# Patient Record
Sex: Female | Born: 1960 | Race: White | Hispanic: No | Smoking: Current some day smoker
Health system: Southern US, Community
[De-identification: ages and names within clinical notes are randomized; demographics above are authoritative.]

## PROBLEM LIST (undated history)

## (undated) DIAGNOSIS — Z9889 Other specified postprocedural states: Secondary | ICD-10-CM

## (undated) DIAGNOSIS — I2510 Atherosclerotic heart disease of native coronary artery without angina pectoris: Secondary | ICD-10-CM

## (undated) DIAGNOSIS — M1990 Unspecified osteoarthritis, unspecified site: Secondary | ICD-10-CM

## (undated) DIAGNOSIS — N189 Chronic kidney disease, unspecified: Secondary | ICD-10-CM

## (undated) DIAGNOSIS — I219 Acute myocardial infarction, unspecified: Secondary | ICD-10-CM

## (undated) DIAGNOSIS — Z720 Tobacco use: Secondary | ICD-10-CM

## (undated) DIAGNOSIS — I10 Essential (primary) hypertension: Secondary | ICD-10-CM

## (undated) DIAGNOSIS — R112 Nausea with vomiting, unspecified: Secondary | ICD-10-CM

## (undated) DIAGNOSIS — F329 Major depressive disorder, single episode, unspecified: Secondary | ICD-10-CM

## (undated) DIAGNOSIS — E119 Type 2 diabetes mellitus without complications: Secondary | ICD-10-CM

## (undated) DIAGNOSIS — N281 Cyst of kidney, acquired: Secondary | ICD-10-CM

## (undated) DIAGNOSIS — E8881 Metabolic syndrome: Secondary | ICD-10-CM

## (undated) HISTORY — DX: Tobacco use: Z72.0

## (undated) HISTORY — DX: Essential (primary) hypertension: I10

## (undated) HISTORY — PX: CHOLECYSTECTOMY: SHX55

## (undated) HISTORY — DX: Atherosclerotic heart disease of native coronary artery without angina pectoris: I25.10

## (undated) HISTORY — DX: Type 2 diabetes mellitus without complications: E11.9

## (undated) HISTORY — DX: Metabolic syndrome: E88.81

## (undated) HISTORY — PX: TOE AMPUTATION: SHX809

## (undated) HISTORY — DX: Cyst of kidney, acquired: N28.1

## (undated) HISTORY — PX: BREAST SURGERY: SHX581

## (undated) HISTORY — DX: Metabolic syndrome: E88.810

## (undated) HISTORY — DX: Major depressive disorder, single episode, unspecified: F32.9

---

## 2000-06-08 ENCOUNTER — Other Ambulatory Visit: Admission: RE | Admit: 2000-06-08 | Discharge: 2000-06-08 | Payer: Self-pay | Admitting: Family Medicine

## 2000-06-23 ENCOUNTER — Ambulatory Visit (HOSPITAL_COMMUNITY): Admission: RE | Admit: 2000-06-23 | Discharge: 2000-06-23 | Payer: Self-pay | Admitting: Family Medicine

## 2000-06-23 ENCOUNTER — Encounter: Payer: Self-pay | Admitting: Family Medicine

## 2001-10-11 ENCOUNTER — Inpatient Hospital Stay (HOSPITAL_COMMUNITY): Admission: EM | Admit: 2001-10-11 | Discharge: 2001-10-14 | Payer: Self-pay | Admitting: *Deleted

## 2001-10-11 ENCOUNTER — Encounter: Payer: Self-pay | Admitting: *Deleted

## 2001-10-12 ENCOUNTER — Encounter: Payer: Self-pay | Admitting: Internal Medicine

## 2001-10-13 ENCOUNTER — Encounter: Payer: Self-pay | Admitting: Internal Medicine

## 2003-05-22 ENCOUNTER — Ambulatory Visit (HOSPITAL_COMMUNITY): Admission: RE | Admit: 2003-05-22 | Discharge: 2003-05-22 | Payer: Self-pay | Admitting: Family Medicine

## 2003-06-04 ENCOUNTER — Ambulatory Visit (HOSPITAL_COMMUNITY): Admission: RE | Admit: 2003-06-04 | Discharge: 2003-06-04 | Payer: Self-pay | Admitting: Family Medicine

## 2003-07-23 ENCOUNTER — Ambulatory Visit (HOSPITAL_COMMUNITY): Admission: RE | Admit: 2003-07-23 | Discharge: 2003-07-23 | Payer: Self-pay | Admitting: Nephrology

## 2004-06-17 ENCOUNTER — Ambulatory Visit (HOSPITAL_COMMUNITY): Admission: RE | Admit: 2004-06-17 | Discharge: 2004-06-17 | Payer: Self-pay | Admitting: Family Medicine

## 2005-07-01 ENCOUNTER — Ambulatory Visit (HOSPITAL_COMMUNITY): Admission: RE | Admit: 2005-07-01 | Discharge: 2005-07-01 | Payer: Self-pay | Admitting: Family Medicine

## 2005-07-20 ENCOUNTER — Ambulatory Visit (HOSPITAL_COMMUNITY): Admission: RE | Admit: 2005-07-20 | Discharge: 2005-07-20 | Payer: Self-pay | Admitting: Family Medicine

## 2006-07-28 ENCOUNTER — Ambulatory Visit (HOSPITAL_COMMUNITY): Admission: RE | Admit: 2006-07-28 | Discharge: 2006-07-28 | Payer: Self-pay | Admitting: Family Medicine

## 2007-08-01 ENCOUNTER — Emergency Department (HOSPITAL_COMMUNITY): Admission: EM | Admit: 2007-08-01 | Discharge: 2007-08-01 | Payer: Self-pay | Admitting: Emergency Medicine

## 2007-08-15 ENCOUNTER — Emergency Department (HOSPITAL_COMMUNITY): Admission: EM | Admit: 2007-08-15 | Discharge: 2007-08-15 | Payer: Self-pay | Admitting: Emergency Medicine

## 2007-08-17 ENCOUNTER — Ambulatory Visit (HOSPITAL_COMMUNITY): Admission: RE | Admit: 2007-08-17 | Discharge: 2007-08-17 | Payer: Self-pay | Admitting: Family Medicine

## 2008-03-28 ENCOUNTER — Ambulatory Visit (HOSPITAL_COMMUNITY): Admission: RE | Admit: 2008-03-28 | Discharge: 2008-03-28 | Payer: Self-pay | Admitting: Family Medicine

## 2009-02-16 HISTORY — PX: BILATERAL OOPHORECTOMY: SHX1221

## 2009-06-14 ENCOUNTER — Emergency Department (HOSPITAL_COMMUNITY): Admission: EM | Admit: 2009-06-14 | Discharge: 2009-06-14 | Payer: Self-pay | Admitting: Emergency Medicine

## 2009-11-22 ENCOUNTER — Ambulatory Visit (HOSPITAL_COMMUNITY): Admission: RE | Admit: 2009-11-22 | Discharge: 2009-11-22 | Payer: Self-pay | Admitting: Family Medicine

## 2010-02-16 DIAGNOSIS — F32A Depression, unspecified: Secondary | ICD-10-CM

## 2010-02-16 HISTORY — DX: Depression, unspecified: F32.A

## 2010-05-06 LAB — URINE CULTURE: Colony Count: >=100000

## 2010-05-06 LAB — URINALYSIS, ROUTINE W REFLEX MICROSCOPIC
Bilirubin Urine: NEGATIVE
Glucose, UA: NEGATIVE mg/dL
Nitrite: POSITIVE — AB
Protein, ur: 100 mg/dL — AB
pH: 6 (ref 5.0–8.0)

## 2010-05-06 LAB — URINE MICROSCOPIC-ADD ON

## 2010-06-27 ENCOUNTER — Emergency Department (HOSPITAL_COMMUNITY)
Admission: EM | Admit: 2010-06-27 | Discharge: 2010-06-27 | Disposition: A | Discharge status: Home / Self Care | Payer: 59 | Attending: Emergency Medicine | Admitting: Emergency Medicine

## 2010-06-27 ENCOUNTER — Emergency Department (HOSPITAL_COMMUNITY): Payer: 59

## 2010-06-27 DIAGNOSIS — N838 Other noninflammatory disorders of ovary, fallopian tube and broad ligament: Secondary | ICD-10-CM | POA: Insufficient documentation

## 2010-06-27 DIAGNOSIS — R109 Unspecified abdominal pain: Secondary | ICD-10-CM | POA: Insufficient documentation

## 2010-06-27 DIAGNOSIS — N83209 Unspecified ovarian cyst, unspecified side: Secondary | ICD-10-CM | POA: Insufficient documentation

## 2010-06-27 LAB — URINALYSIS, ROUTINE W REFLEX MICROSCOPIC
Bilirubin Urine: NEGATIVE
Leukocytes, UA: NEGATIVE
Nitrite: NEGATIVE
Protein, ur: >300 mg/dL — AB
Specific Gravity, Urine: >1.03 — ABNORMAL HIGH (ref 1.005–1.030)

## 2010-06-27 LAB — COMPREHENSIVE METABOLIC PANEL
Albumin: 3.5 g/dL (ref 3.5–5.2)
BUN: 9 mg/dL (ref 6–23)
CO2: 26 mEq/L (ref 19–32)
Chloride: 101 mEq/L (ref 96–112)
GFR calc non Af Amer: >60 mL/min (ref >60)
Glucose, Bld: 143 mg/dL — ABNORMAL HIGH (ref 70–99)
Potassium: 3.7 mEq/L (ref 3.5–5.1)
Sodium: 135 mEq/L (ref 135–145)
Total Bilirubin: 0.5 mg/dL (ref 0.3–1.2)

## 2010-06-27 LAB — DIFFERENTIAL
Basophils Absolute: 0 10*3/uL (ref 0.0–0.1)
Basophils Relative: 0 % (ref 0–1)
Eosinophils Relative: 1 % (ref 0–5)
Monocytes Absolute: 0.4 10*3/uL (ref 0.1–1.0)

## 2010-06-27 LAB — LIPASE, BLOOD: Lipase: 25 U/L (ref 11–59)

## 2010-06-27 LAB — URINE MICROSCOPIC-ADD ON

## 2010-06-27 LAB — CBC: RDW: 13.3 % (ref 11.5–15.5)

## 2010-06-27 MED ORDER — IOHEXOL 300 MG/ML  SOLN
100.0000 mL | Freq: Once | INTRAMUSCULAR | Status: AC | PRN
  Administered 2010-06-27: 100 mL via INTRAVENOUS

## 2010-07-04 NOTE — Consult Note (Signed)
NAME:  Christy [name], Christy [name]                          ACCOUNT NO.:  [id number]   MEDICAL RECORD NO.:  [id number]                   PATIENT TYPE:  INP   LOCATION:  A325                                 FACILITY:  [facility]   PHYSICIAN:  Dalia Heading, M.D.               DATE OF BIRTH:  [birth year]   DATE OF CONSULTATION:  10/12/2001  DATE OF DISCHARGE:                           GENERAL SURGERY CONSULTATION   CHIEF COMPLAINT:  Cholelithiasis, gallstone pancreatitis.   HISTORY OF PRESENT ILLNESS:  The patient is a 50 year old white female who  presented yesterday with worsening epigastric and right upper quadrant  abdominal pain and nausea.  She was noted in the emergency room to have  elevated liver enzyme tests as well as pancreatitis.  An ultrasound of the  gallbladder was performed which revealed cholelithiasis with a common bile  duct that measured only 3.5 mm.  No choledocholithiasis was seen.  Her  followup liver enzyme tests have been decreasing back to normal.  Her  bilirubin is within normal limits.  Her amylase and lipase have been  decreasing.  SGOT and SGPT have decreased.   Dr. Lionel December of gastroenterology was consulted and felt that an ERCP  was not warranted at this time.   PAST MEDICAL HISTORY:  Past medical history includes hypertension,  depression.   PAST SURGICAL HISTORY:  Breast biopsy in the past.   CURRENT MEDICATIONS:  Maxzide, Effexor.   ALLERGIES:  No known drug allergies.   REVIEW OF SYSTEMS:  The patient does smoke a pack of cigarettes a day.  She  denies any recent chest pain, shortness of breath, CVA, diabetes mellitus,  or bleeding disorders.   PHYSICAL EXAMINATION:  GENERAL:  On physical examination, the patient is a  well-developed, well-nourished white female in no acute distress.  VITAL SIGNS:  She is afebrile and vital signs are stable.  ABDOMEN:  Abdomen is soft with mild tenderness in the right upper quadrant  to palpation.  No  hepatosplenomegaly, masses, or herniae are identified.   LABORATORY AND ACCESSORY CLINICAL DATA:  White blood cell count 10.4,  hematocrit 35, platelet count 236,000.  MET-7 is within normal limits.   Liver tests and amylase and lipase are in the chart.   IMPRESSION:  Gallstone pancreatitis.    PLAN:  The patient is scheduled for laparoscopic cholecystectomy with  intraoperative cholangiograms tomorrow.  The risks and benefits of the  procedure including bleeding, infection, hepatobiliary injury, and the  possibility of an open procedure were fully explained to the patient, who  gave informed consent.                                               Dalia Heading, M.D.    MAJ/MEDQ  D:  10/12/2001  T:  10/13/2001  Job:  [id number]   cc:   Madelin Rear. Sherwood Gambler, M.D.  [address]  [employer]  [place] [address]  Fax: [phone]   Lionel December, M.D.

## 2010-07-04 NOTE — Discharge Summary (Signed)
   NAME:  Christy [name], Christy [name]                          ACCOUNT NO.:  [id number]   MEDICAL RECORD NO.:  [id number]                   PATIENT TYPE:  INP   LOCATION:  A325                                 FACILITY:  [facility]   PHYSICIAN:  Dalia Heading, M.D.               DATE OF BIRTH:  18-Apr-1960   DATE OF ADMISSION:  10/11/2001  DATE OF DISCHARGE:  10/14/2001                                 DISCHARGE SUMMARY   HOSPITAL COURSE SUMMARY:  The patient is a 50 year old white female with  history of hypertension who presented with upper abdominal pain, nausea,  vomiting.  She was diagnosed with gallstone pancreatitis.  Ultrasound  revealed a gallbladder full of stones, but the common bile duct was within  normal limits.  Her liver enzyme test, as well as amylase and lipase, were  returning to normal on hospital day #2.  A GI consultation was obtained and  was felt the patient did not need an ERCP prior to surgery.  A surgery  consultation was obtained and the patient was taken to the operating room on  October 13, 2001 and underwent a laparoscopic cholecystectomy with  cholangiograms.  The cholangiograms did not reveal choledocholithiasis.  The  patient tolerated the procedure well.   The patient, on postoperative day #1, is doing well.  Her hematocrit is  stable, as well as her white blood cell count.  Liver enzyme test, as well  as amylase and lipase are pending.  The patient is being discharged home  pending those results.   FOLLOW UP:  The patient is to follow up with Dr. Franky Macho on October 20, 2001.   DISCHARGE MEDICATIONS:  Vicodin 1-2 tablets p.o. q.4h. p.r.n. pain,  dispensed 50, no refills.   PRINCIPAL DIAGNOSES:  1. Gallstone pancreatitis.  2. Cholecystitis, cholelithiasis.  3.     Hypertension.  4. Depression.   PRINCIPAL PROCEDURE:  Laparoscopic cholecystectomy with cholangiograms on  October 13, 2001.                                                Dalia Heading,  M.D.    MAJ/MEDQ  D:  10/14/2001  T:  10/14/2001  Job:  [id number]   cc:   Madelin Rear. Sherwood Gambler, M.D.  [address]  [place]  [place] [address]  Fax: [phone]   Lionel December, M.D.

## 2010-07-04 NOTE — H&P (Signed)
NAME:  Christy [name], Christy [name]                          ACCOUNT NO.:  [id number]   MEDICAL RECORD NO.:  [id number]                   PATIENT TYPE:  INP   LOCATION:  A325                                 FACILITY:  [facility]   PHYSICIAN:  Isidor Holts, M.D.               DATE OF BIRTH:  Jan 09, 1961   DATE OF ADMISSION:  10/11/2001  DATE OF DISCHARGE:                                HISTORY & PHYSICAL   CHIEF COMPLAINT:  Abdominal pain for two days, vomited once.   HISTORY OF PRESENT ILLNESS:  This is a 50 year old Caucasian female with  history essentially as above.  She was quite well, until lunchtime on October 10, 2001.  She ate a lunch of barbeque and buttered potatoes.  An hour  later, developed upper abdominal pains radiating to her back.  They were  constant, associated with watery motions x3.  No blood or mucus in the  stools.  This has now stopped.  The abdominal pain, however, continued all  night, gradually increasing in severity.  She went to work in the morning of  October 11, 2001 but had to return home at about 2:00 p.m. due to pain.  Her  spouse eventually brought her to the emergency room at [facility].  While in the emergency room, she vomited once.  Vomitus consisted of food  remnants.  There was no hematemesis.   PAST MEDICAL HISTORY:  1. Premenstrual dysphoric disorder.  2. Dysmenorrhea.  3. Hypertension.  4. Status post right breast biopsy two years ago, benign findings.   ALLERGIES:  No known drug allergies.   MEDICATIONS:  1. Effexor XR 150 mg p.o. q.d.  2. Fluoxetine 30 mg p.o. q.d.  3. Maxzide 37.5/25 one p.o. q.d.  4. Hydrocodone p.r.n.   SOCIAL HISTORY:  Married with one offspring who is alive and well.  Smoker,  two packs per day.  Denies alcohol use.  Denies drug abuse.   PHYSICAL EXAMINATION:  VITAL SIGNS:  Temperature 97.2 degrees, pulse 72,  respirations 20, blood pressure 116/68.  GENERAL:  She appeared comfortable after Demerol/Phenergan in  the emergency  room, was communicative and cooperative.  HEENT:  No clinical pallor, no jaundice, no conjunctival injection.  NECK:  Supple.  No palpable lymphadenopathy.  No palpable goiter.  JVD was  not seen.  CHEST:  Occasional expiratory rhonchi.  No crackles heard.  HEART:  Heart sounds were normal, 1 and 2 were heard, regular, no murmurs.  ABDOMEN:  Obese, soft,with right upper quadrant and epigastric tenderness.  No guarding or rebound.  No palpable organomegaly.  Normal bowel sounds.  EXTREMITIES:  Lower extremity examination was entirely unremarkable.   INVESTIGATIONS:  Labs:  Chemistry:  Sodium 135, potassium 4.5, chloride 99,  CO2 29, BUN 10, creatinine 1.1, glucose 155.  LFT's:  AST 248, ALT 171,  alkaline phosphatase 116, total bilirubin 2.5, direct bilirubin 1.4, amylase  984,  lipase 2722.  CBC:  WBC 10.6, hemoglobin 14.0, hematocrit 39.7,  platelets 292.  Urinalysis:  Bilirubin moderate, hemoglobin moderate, total  protein over 300, nitrite positive, leukocyte esterase negative.  __________  test positive.   ASSESSMENT AND PLAN:  Admit general medical floor.   1. Acute pancreatitis, likely secondary to gallstones in view of impaired     LFT's; however, thyroid treatment may be contributory.  We will keep     n.p.o., discontinue thiazides.  Commence intravenous fluids, proton pump     inhibitor, pain medications, and anti-emetics.  We have arranged a     hepatobiliary/pancreatic ultrasound to assess the pancreas and rule out     gallstones.  Gastroenterology consult will be required, provided by Dr.     Karilyn Cota, who will also check a fasting lipid profile.  2. Hypertension, controlled.  3. Premenstrual dysphoric disorder, stable.   Further management will depend on clinical course.                                               Isidor Holts, M.D.    CO/MEDQ  D:  10/11/2001  T:  10/12/2001  Job:  [id number]   cc:   Kirk Ruths, M.D.  [address]   [place]  [place] [address]  Fax: [phone]

## 2010-07-04 NOTE — Consult Note (Signed)
NAME:  Christy [name], Christy [name]                          ACCOUNT NO.:  [id number]   MEDICAL RECORD NO.:  [id number]                   PATIENT TYPE:  INP   LOCATION:  A325                                 FACILITY:  [facility]   PHYSICIAN:  Lionel December, M.D.                 DATE OF BIRTH:  [birth year]   DATE OF CONSULTATION:  10/12/2001  DATE OF DISCHARGE:                                   CONSULTATION   PHYSICIAN REQUESTING CONSULTATION:  Dr. Isidor Holts for Dr. Madelin Rear.  [place].   REASON FOR CONSULTATION:  Pancreatitis, rule out cholelithiasis.   HISTORY OF PRESENT ILLNESS:  The patient is a pleasant 50 year old Caucasian  female with history of premenstrual disorder, hypertension, who was admitted  yesterday with a two-day history of epigastric abdominal pain, nausea and  vomiting.  She developed abdominal pain two days ago shortly after eating  dinner.  She had spaghetti and within one hour, developed upper epigastric  pain, feeling of excess gas and nausea.  She had three episodes of watery  diarrhea.  By the next morning, she felt some better, went to work, however,  after eating lunch, symptoms recurred and were quite severe.  She describes  the pain as constant in nature.  Denies any radiation into the back.  In the  emergency department, she did have an episode of emesis.  Denies  hematemesis, melena, bright red blood per rectum.  Usually, she has no  problems with constipation or diarrhea.  She had heartburn a couple of days  a week and usually takes Mylanta, which gives relief.  She reports having a  similar episode one month ago and was diagnosed with acute gastroenteritis.  She has also had episodes similar to this approximately four years ago but  never required hospitalization, never given diagnosis of pancreatitis or  gallstones previously.   In the emergency department, she was found to have an elevated white count  of 10.6, hemoglobin 14, hematocrit 39.7; potassium 4.5,  BUN 10, creatinine  1.1, glucose 155, total bilirubin 2.5, direct bilirubin 1.4, alkaline  phosphatase 116, SGOT 248, SGPT 171, albumin 3.5, amylase 94, lipase 2722.  Urinalysis also revealed moderate bilirubin, trace ketones, moderate blood,  protein greater than 300, positive nitrites, a few squamous cells, granular  casts, 7-10 wbc's, 11-20 rbc's and a few bacteria.  She is currently on IV  Protonix 40 mg q.24h., given IV Demerol for pain control.  She tells me that  she is feeling better this morning.  The pain is less intense, no further  vomiting.  She is unsure if she takes any NSAIDs.  She is on PMS pills and  Equate sinus pills.   MEDICATIONS AT HOME:  1. Effexor XR 150 mg q.d.  2. Fluoxetine 20 mg q.d.  3. Maxzide 37.5/25 mg q.d.  4. Hydrocodone p.r.n.  5. Phenergan suppositories p.r.n.  6. PMS pills p.r.n.  7. Equate sinus pills p.r.n.   ALLERGIES:  No known drug allergies.   PAST MEDICAL HISTORY:  Hypertension, dysmenorrhea, premenstrual dysphoric  disorder.   PAST SURGICAL HISTORY:  Breast biopsy for benign lesion two years ago.   FAMILY HISTORY:  Mother has a history of gallstones, status post  cholecystectomy.  Maternal grandfather had lung cancer and possibly primary  brain tumor as well.   SOCIAL HISTORY:  She is married and has one child.  She smokes 2 packs of  cigarettes daily and has smoked since age 70.  No alcohol use or drug use.   REVIEW OF SYSTEMS:  Please see HPI for GI.  GU:  Denies any dysuria or  hematuria.  Complains of severe menstrual cramps during menstruation.   PHYSICAL EXAMINATION:  VITAL SIGNS:  Height 5 feet 7 inches.  Weight 235.7.  T-max and T current 98.7, pulse 68, respirations 20, blood pressure 112/65.  GENERAL:  A very pleasant, well-nourished, well-developed, moderately obese  Caucasian female in no acute distress.  SKIN:  Warm and dry.  No obvious jaundice.  HEENT:  Sclerae slightly icteric.  Conjunctivae are pink.   Oropharyngeal  mucosa moist and pink.  No lesions, erythema or exudate.  NECK:  No lymphadenopathy or thyromegaly.  CHEST:  Lungs clear to auscultation.  CARDIAC:  Exam reveals regular rate and rhythm, normal S1 and S2; no  murmurs, rubs, or gallops.  ABDOMEN:  Positive bowel sounds.  Obese but symmetrical.  Moderate  tenderness in the epigastric region.  No hepatosplenomegaly or masses.  No  rebound tenderness or guarding.  EXTREMITIES:  No edema.   LABORATORY DATA:  Laboratories as mentioned in HPI.  In addition, white  count today is 10.4, hemoglobin 12.9, hematocrit 37.2, platelets 236,000.   IMPRESSION:  The patient is a pleasant 50 year old Caucasian female with  probable biliary pancreatitis.  She has elevated amylase and lipase as well  as liver function tests and a pattern suggestive of biliary pancreatitis.  At this time, her symptoms have improved, therefore suggesting that she may  have already passed a gallstone.  We need to wait on abdominal ultrasound;  if common bile duct is dilated, she will need endoscopic retrograde  cholangiopancreatogram, otherwise, will need laparoscopic cholecystectomy  with intraoperative cholangiogram.   SUGGESTIONS:  1. Hepatobiliary pancreatic ultrasound as ordered, ASAP.  2. Follow up on pending LFTs and MET-7 from today.  3.     Agree with Levaquin, also will cover for cholangitis prophylaxis.  4. Further recommendations to follow.   I would like to thank Dr. Sherwood Gambler for allowing [place] to take part in the care of  this patient.      Tana Coast, PA                          Lionel December, M.D.    LL/MEDQ  D:  10/12/2001  T:  10/12/2001  Job:  [id number]   cc:   Madelin Rear. Sherwood Gambler, M.D.  [address]  [place]  [place] [address]  Fax: [phone]   Lionel December, M.D.

## 2010-07-04 NOTE — Op Note (Signed)
NAME:  Christy [name], Christy [name]                          ACCOUNT NO.:  [id number]   MEDICAL RECORD NO.:  [id number]                   PATIENT TYPE:  INP   LOCATION:  A325                                 FACILITY:  [facility]   PHYSICIAN:  Dalia Heading, M.D.               DATE OF BIRTH:  January 12, 1961   DATE OF PROCEDURE:  10/13/2001  DATE OF DISCHARGE:                                 OPERATIVE REPORT   PREOPERATIVE DIAGNOSES:  Cholecystitis, cholelithiasis, gallstone  pancreatitis.   POSTOPERATIVE DIAGNOSES:  Cholecystitis, cholelithiasis, gallstone  pancreatitis.   PROCEDURE:  Laparoscopic cholecystectomy with cholangiograms.   SURGEON:  Dalia Heading, M.D.   ANESTHESIA:  General endotracheal   INDICATIONS:  The patient is a 50 year old white female who presents with  gallstone pancreatitis.  Ultrasound of the gallbladder reveals  cholelithiasis with a normal common bile duct.  Her liver enzymes tests have  returned to normal.  Her amylase and lipase have been returning to normal.  She now presents for laparoscopic cholecystectomy with cholangiograms.  The  risks and benefits of the procedure including bleeding, infection,  hepatobiliary injury, and the possibility of an open procedure were fully  explained to the patient, who gave informed consent.   DESCRIPTION OF PROCEDURE:  The patient was placed in the supine position.  After the induction of general endotracheal anesthesia, the abdomen was  prepped and draped using the usual sterile technique with Betadine.   An supraumbilical incision was made down to the fascia.  A Veress needle was  introduced into the abdominal cavity and confirmation of placement was done  using the saline drop test.  The abdomen was then insufflated to 16 mmHg  pressure.  An 11-mm trocar was introduced into the abdominal cavity under  direct visualization without difficulty.  The patient was placed in reverse  Trendelenburg position and an additional 11-mm  trocar was placed in the  epigastric region and 5-mm trocars were placed in the right upper quadrant  and right flank regions. The liver was inspected and noted to be somewhat  swollen in nature.  The gallbladder was noted to be swollen and edematous.   The gallbladder was retracted superiorly and laterally.  The dissection was  begun at the fundus of the gallbladder and taken down to the infundibulum.  A single clip was placed across the proximal cystic duct.  An incision was  made into the cystic duct and the cholangiocatheter was inserted.  Cholangiograms were performed under fluoroscopic guidance.  The  hepatobiliary tree was noted to be without filling defects.  It was noted to  be dilated at approximately 5-6 mm.  Dye flowed freely into the duodenum.  The cholangiocatheter was removed, and due to edema, an Endo-GIA was placed  across the cystic duce and fired.  The gallbladder was then removed using an  EndoCatch bag.  Several stones did spill out of the gallbladder  and these  were found and retrieved.   The gallbladder fossa was inspected and no abnormal bleeding or bile leakage  was noted.  Surgicel was placed in the gallbladder fossa.  The subhepatic  space, as well as the right hepatic gutter, were irrigated with normal  saline.  All fluid and air were then evacuated from the abdominal cavity  prior to removal of the trocars.   All wounds were irrigated with normal saline.  All wounds were injected with  0.5% Sensorcaine.  The supraumbilical fascia as well as the epigastric  fascia were reapproximated using an #0 Vicryl interrupted suture. All skin  incisions were closed using staples. Betadine ointment and dry sterile  dressings were applied.   All tape and needle counts correct at the end of the procedure.  The patient  was extubated in the operating room and went back to recovery room in awake  and stable condition.   COMPLICATIONS:  None.   SPECIMENS:  Gallbladder  with stones.   BLOOD LOSS:  Minimal.                                               Dalia Heading, M.D.    MAJ/MEDQ  D:  10/13/2001  T:  10/15/2001  Job:  [id number]   cc:   Madelin Rear. Sherwood Gambler, M.D.  [address]  [place]  [place] [address]  Fax: [phone]   Lionel December, M.D.

## 2010-07-28 ENCOUNTER — Emergency Department (HOSPITAL_COMMUNITY)
Admission: EM | Admit: 2010-07-28 | Discharge: 2010-07-28 | Disposition: A | Discharge status: Home / Self Care | Payer: 59 | Attending: Emergency Medicine | Admitting: Emergency Medicine

## 2010-07-28 DIAGNOSIS — I10 Essential (primary) hypertension: Secondary | ICD-10-CM | POA: Insufficient documentation

## 2010-07-28 DIAGNOSIS — Y849 Medical procedure, unspecified as the cause of abnormal reaction of the patient, or of later complication, without mention of misadventure at the time of the procedure: Secondary | ICD-10-CM | POA: Insufficient documentation

## 2010-07-28 DIAGNOSIS — IMO0002 Reserved for concepts with insufficient information to code with codable children: Secondary | ICD-10-CM | POA: Insufficient documentation

## 2010-09-17 DIAGNOSIS — I2510 Atherosclerotic heart disease of native coronary artery without angina pectoris: Secondary | ICD-10-CM | POA: Insufficient documentation

## 2010-09-17 DIAGNOSIS — I219 Acute myocardial infarction, unspecified: Secondary | ICD-10-CM

## 2010-09-17 HISTORY — DX: Atherosclerotic heart disease of native coronary artery without angina pectoris: I25.10

## 2010-09-17 HISTORY — DX: Acute myocardial infarction, unspecified: I21.9

## 2010-10-01 ENCOUNTER — Inpatient Hospital Stay (HOSPITAL_COMMUNITY)
Admission: AD | Admit: 2010-10-01 | Discharge: 2010-10-02 | DRG: 247 | Discharge status: Against Medical Advice | Payer: Managed Care, Other (non HMO) | Source: Ambulatory Visit | Attending: Interventional Cardiology | Admitting: Interventional Cardiology

## 2010-10-01 DIAGNOSIS — I2510 Atherosclerotic heart disease of native coronary artery without angina pectoris: Secondary | ICD-10-CM | POA: Diagnosis present

## 2010-10-01 DIAGNOSIS — R7309 Other abnormal glucose: Secondary | ICD-10-CM | POA: Diagnosis present

## 2010-10-01 DIAGNOSIS — F17200 Nicotine dependence, unspecified, uncomplicated: Secondary | ICD-10-CM | POA: Diagnosis present

## 2010-10-01 DIAGNOSIS — I10 Essential (primary) hypertension: Secondary | ICD-10-CM | POA: Diagnosis present

## 2010-10-01 DIAGNOSIS — I2109 ST elevation (STEMI) myocardial infarction involving other coronary artery of anterior wall: Principal | ICD-10-CM | POA: Diagnosis present

## 2010-10-01 HISTORY — PX: CORONARY ANGIOPLASTY WITH STENT PLACEMENT: SHX49

## 2010-10-01 LAB — COMPREHENSIVE METABOLIC PANEL
ALT: 12 U/L (ref 0–35)
AST: 16 U/L (ref 0–37)
Albumin: 2.9 g/dL — ABNORMAL LOW (ref 3.5–5.2)
Alkaline Phosphatase: 94 U/L (ref 39–117)
GFR calc Af Amer: >60 mL/min (ref >60)
GFR calc non Af Amer: >60 mL/min (ref >60)
Glucose, Bld: 131 mg/dL — ABNORMAL HIGH (ref 70–99)
Sodium: 137 mEq/L (ref 135–145)
Total Protein: 6.9 g/dL (ref 6.0–8.3)

## 2010-10-01 LAB — HEMOGLOBIN A1C
Hgb A1c MFr Bld: 7 % — ABNORMAL HIGH (ref <5.7)
Mean Plasma Glucose: 154 mg/dL — ABNORMAL HIGH (ref <117)

## 2010-10-01 LAB — CARDIAC PANEL(CRET KIN+CKTOT+MB+TROPI)
Relative Index: 4.3 — ABNORMAL HIGH (ref 0.0–2.5)
Total CK: 105 U/L (ref 7–177)
Troponin I: 0.45 ng/mL (ref <0.30)

## 2010-10-01 LAB — CBC
MCH: 31.5 pg (ref 26.0–34.0)
MCHC: 33.7 g/dL (ref 30.0–36.0)
RDW: 14 % (ref 11.5–15.5)
WBC: 7.7 10*3/uL (ref 4.0–10.5)

## 2010-10-01 LAB — APTT: aPTT: 33 seconds (ref 24–37)

## 2010-10-01 LAB — POCT I-STAT, CHEM 8
Calcium, Ion: 1.16 mmol/L (ref 1.12–1.32)
HCT: 39 % (ref 36.0–46.0)
Sodium: 141 mEq/L (ref 135–145)

## 2010-10-01 LAB — PROTIME-INR: Prothrombin Time: 14.2 seconds (ref 11.6–15.2)

## 2010-10-02 LAB — BASIC METABOLIC PANEL
CO2: 24 mEq/L (ref 19–32)
Calcium: 9.3 mg/dL (ref 8.4–10.5)
Chloride: 104 mEq/L (ref 96–112)
Glucose, Bld: 159 mg/dL — ABNORMAL HIGH (ref 70–99)
Sodium: 137 mEq/L (ref 135–145)

## 2010-10-02 LAB — CARDIAC PANEL(CRET KIN+CKTOT+MB+TROPI)
CK, MB: 7.9 ng/mL (ref 0.3–4.0)
Relative Index: 6.4 — ABNORMAL HIGH (ref 0.0–2.5)
Total CK: 124 U/L (ref 7–177)
Troponin I: 1.08 ng/mL (ref <0.30)

## 2010-10-02 LAB — CBC
Hemoglobin: 13.3 g/dL (ref 12.0–15.0)
MCH: 32.4 pg (ref 26.0–34.0)
Platelets: 236 10*3/uL (ref 150–400)
RBC: 4.1 MIL/uL (ref 3.87–5.11)
WBC: 8.7 10*3/uL (ref 4.0–10.5)

## 2010-10-13 NOTE — H&P (Signed)
  NAMESHARILYN, GEISINGER NO.:  [id number]  MEDICAL RECORD NO.:  [id number]  LOCATION:  2912                         FACILITY:  [facility]  PHYSICIAN:  Corky Crafts, MDDATE OF BIRTH:  07/23/1960  DATE OF ADMISSION:  10/01/2010 DATE OF DISCHARGE:                             HISTORY & PHYSICAL   PRIMARY CARE PHYSICIAN:  Kirk Ruths, MD.  REASON FOR ADMISSION:  Anterior ST elevation MI.  HISTORY OF PRESENT ILLNESS:  The patient is a 50 year old with hypertension, borderline diabetes, and a long history of tobacco abuse. She has been feeling poorly for the past couple of days.  She thought her feelings were due to stress, her husband recently passed away.  This morning, her symptoms got worse, and she went to her primary care physician.  An ECG was done there, and she was sent via EMS to the [facility] cath lab due to concern for "STEMI."  After arriving here, she has been pain-free.  She received aspirin and nitroglycerin en route, and currently has no complaints.  PAST MEDICAL HISTORY: 1. Hypertension. 2. Borderline diabetes.  ALLERGIES:  BACTRIM.  MEDICATIONS AT HOME: 1. Lisinopril 10 mg daily. 2. Fish oil.  SOCIAL HISTORY:  She is recently widowed.  She does smoke.  She works at [employer].  FAMILY HISTORY:  Significant for coronary artery disease.  PAST SURGICAL HISTORY:  She has had a cholecystectomy.  REVIEW OF SYSTEMS:  Recent chest pain, fatigue, stress.  No swelling. No bleeding problems.  No focal weakness.  No rash.  All other systems negative.  PHYSICAL EXAM:  VITAL SIGNS: Blood pressure 156/72 and heart rate is 70. GENERAL:  She is awake and alert, in no apparent distress. HEENT: Head: Normocephalic, atraumatic.  Eyes: Extraocular is intact. NECK: No JVD. CARDIOVASCULAR: Regular rate and rhythm.  S1,S2. LUNGS: No wheezing. ABDOMEN: Soft, nontender. EXTREMITIES: Showed no edema. NEURO: No focal deficits.  LABORATORY FINDINGS:   ECG shows normal sinus rhythm with ST changes in the lateral leads in particular the ST-segment has become more convex, the T-waves amplitude has increased as well.  Lab work is pending.  ASSESSMENT/PLAN:  A 50 year old with multiple risk factors for coronary artery disease.  PLAN: 1. We will plan for emergent cardiac catheterization to evaluate her     anatomy, it seems likely that she may have disease in her LAD or in     the diagonals based on her ECG. 2. She will need risk factor modifications, she will need to stop     smoking.  Continue ACE inhibitor for blood pressure.  She will also     likely need cholesterol-lowering medicine if coronary artery     disease is found. 3. She will likely be watched in the CCU postprocedure.     Corky Crafts, MD     JSV/MEDQ  D:  10/01/2010  T:  10/02/2010  Job:  [id number]  Electronically Signed by Lance Muss MD on 10/13/2010 02:10:24 PM

## 2010-10-13 NOTE — Cardiovascular Report (Signed)
Christy [name], [name] NO.:  [id number]  MEDICAL RECORD NO.:  [id number]  LOCATION:  2912                         FACILITY:  [facility]  PHYSICIAN:  Corky Crafts, MDDATE OF BIRTH:  05/13/1960  DATE OF PROCEDURE:  10/01/2010 DATE OF DISCHARGE:                           CARDIAC CATHETERIZATION   PROCEDURES PERFORMED:  Left heart catheterization, left ventriculogram, coronary angiogram, PCI of the LAD.  OPERATOR:  Corky Crafts, MD  INDICATIONS:  Anterior ST elevation MI.  PROCEDURE:  The patient was brought emergently to the cath lab due to ECG changes.  She was prepped and draped in usual sterile fashion.  Her right groin was infiltrated with 1% lidocaine.  A 6-French sheath was placed into the right common femoral artery using the modified Seldinger technique.  Left coronary artery angiography was performed using a JL- 4.0 catheter.  The catheter was advanced to the vessel ostium under fluoroscopic guidance.  Digital angiography was performed in multiple projections using hand injection with contrast.  Right coronary artery angiography was performed using a JR-4.5 catheter in a similar fashion. The PCI was then performed.  A guide catheter was not used initially for the left system because there is some question as to the acuity of the changes on ECG.  Angiomax was used for anticoagulation.  After the intervention, a pigtail catheter was used to perform a left ventriculogram.  Hemodynamic pressures were measured in the left ventricle and the aorta.  Several doses of intracoronary nitroglycerin were given during the intervention to treat vasospasm in the mid LAD.  FINDINGS:  The left main is widely patent.  The left circumflex is medium-sized vessel and appeared widely patent. The OM-1 and OM-2 are both small and patent.  In the mid circumflex, there is a 70% stenosis in the continuation branch.  This is a small vessel.  The LAD is a large  wraparound vessel.  In the proximal to midportion, there is a focal 90% stenosis which appeared ulcerated and hazy.  In the mid-to-distal LAD, there is a 70% hazy stenosis.  The first diagonal had a proximal 90% stenosis with disease extending up to the ostium of the vessel.  The second and third diagonals were small, but patent.  The right coronary artery is a large vessel proximally.  The mid vessel had diffuse moderate disease and at the crux there is a 40-50% lesion.  Left ventriculogram showed apical hypokinesis with an ejection fraction of 45%.  LV pressure 125/11 with an LVEDP of 20 mmHg.  Aortic pressure 125/70 with a mean aortic pressure of 92 mmHg, PCI narrative, but CLS 3.5 guiding catheter was used.  A Prowater wire was placed across the diseased area in the LAD.  The mid-to-distal lesion was predilated with a 2.5 x 12 emerge balloon inflated to 6 atmospheres.  The balloon was withdrawn and this was used to treat the more proximal area inflated to 8 atmospheres.  The more distal area was stented with a 2.5 x 12 resolute drug-eluting stent, deployed at 12 atmospheres.  The proximal area was then stented with a 3.0 x 15 resolute inflated to 11 atmospheres.  A 3.25 x 9 balloon was  used to post dilate the distal stent to 8 atmospheres and the more proximal stent to 18 atmospheres. At the distal edge of the proximal stent, there was some narrowing likely due to diffuse disease in the vessel and partly due to vasospasm. At the proximal edge of the distal stent, there is also what appeared to be vasospasm.  This resolved with intracoronary nitroglycerin that was administered.  There was no residual stenosis in the stented area. Between the 2 stent, there was moderate plaque buildup up to 40-50%. TIMI 3 flow was present on the initial diagnostic angiogram.  This was maintained throughout the intervention.  IMPRESSION: 1. Anterior ST elevation myocardial infarction.  TIMI 3 flow at  the     time of diagnostic cath, 2 drug-eluting stent placed in the mid LAD     at different spots of significant disease. 2. Mildly decreased LV function. 3. Moderate diffuse disease in the RCA and circ.  There is also some     moderate disease between the 2 stents in the mid LAD.  RECOMMENDATIONS:  Continue aspirin and Brilinta for at least a year. She will need aggressive risk factor modification.  She will be watched in the CCU.     Corky Crafts, MD     JSV/MEDQ  D:  10/01/2010  T:  10/02/2010  Job:  [id number]  Electronically Signed by Lance Muss MD on 10/13/2010 02:10:16 PM

## 2010-10-17 ENCOUNTER — Encounter: Payer: Self-pay | Admitting: Adult Health

## 2010-10-17 ENCOUNTER — Ambulatory Visit (INDEPENDENT_AMBULATORY_CARE_PROVIDER_SITE_OTHER): Payer: Managed Care, Other (non HMO) | Admitting: Adult Health

## 2010-10-17 DIAGNOSIS — I10 Essential (primary) hypertension: Secondary | ICD-10-CM

## 2010-10-17 DIAGNOSIS — Z9861 Coronary angioplasty status: Secondary | ICD-10-CM

## 2010-10-17 DIAGNOSIS — I519 Heart disease, unspecified: Secondary | ICD-10-CM

## 2010-10-17 DIAGNOSIS — F17200 Nicotine dependence, unspecified, uncomplicated: Secondary | ICD-10-CM

## 2010-10-17 DIAGNOSIS — F341 Dysthymic disorder: Secondary | ICD-10-CM

## 2010-10-17 DIAGNOSIS — Z720 Tobacco use: Secondary | ICD-10-CM

## 2010-10-17 DIAGNOSIS — I2510 Atherosclerotic heart disease of native coronary artery without angina pectoris: Secondary | ICD-10-CM

## 2010-10-17 DIAGNOSIS — F329 Major depressive disorder, single episode, unspecified: Secondary | ICD-10-CM | POA: Insufficient documentation

## 2010-10-17 MED ORDER — ISOSORBIDE MONONITRATE 15 MG HALF TABLET: 15.0000 mg | ORAL_TABLET | Freq: Every day | ORAL | Status: DC

## 2010-10-17 NOTE — Patient Instructions (Signed)
Your physician has recommended you make the following change in your medication: start taking Isosorbide 15 mg (1/2 of a 30 mg tablet) daily  Your physician recommends that you start cardiac rehab. Someone from rehab will contact you with details  Your physician recommends that you schedule a follow-up appointment in: 1 month

## 2010-10-17 NOTE — Assessment & Plan Note (Signed)
She continues to have chest discomfort and fatigue. She is using NTG approx every other day.  I have started her on isosorbide 30 mg 1/2 tablet daily to assist with her angina.  I think this may be more emotionally based than ischemic at this point.  She is to start cardiac rehab and will follow with [place] in 1 month.  She is advised to take it easy and not overdue until cardiac rehab has had a opportunity to help her get her strength back. She is to call us for any chest discomfort which is not relieved with NTG or go to nearest ER.

## 2010-10-17 NOTE — Assessment & Plan Note (Signed)
I have counseled her on cessation and she has really cut down from 3 pkg a day to 6 cigarettes a day. She is encouraged in her efforts.

## 2010-10-17 NOTE — Assessment & Plan Note (Signed)
BP is moderately elevated in the setting of emotional distress in our office. I will make no changes at this time in her medications. She will continue on lisinopril 10mg  daily and metoprolol 25 mg daily.

## 2010-10-17 NOTE — Assessment & Plan Note (Signed)
She is advised to keep close to her support system during this acute mourning process and seek counseling should she become unable to cope.

## 2010-10-17 NOTE — Progress Notes (Signed)
HPI:Christy [name] is a 50 y/o patient new to the office after admission to [facility] hospital in the setting of STEMI.  She was found to have severe LAD stenosis and received 2 DES to the diseased portion of this vessel.  Unfortunately, the patient left AMA after recovering from catheterization. She was recently widowed 2 days before and had just left the hospital after being with her husband for 14 day admission until his death.  She stated that she was too emotional to stay any longer.  She was given medication RX and is now in our office for follow-up.    She remains tearful when speaking of her MI and husband's death 2 weeks ago.  She said it has been too much in a short period of time.  She has had to take NTG X 5 in the last 2 weeks for recurrent chest pain under emotional stress or with minimal exertion.  She has cut down on her smoking from 3 pks a day to 6 cigarettes a day. She has adhered to a diabetic, low salt diet and has lost 10 lbs since discharge. She is medically compliant. She complains of fatigue and has not yet begun cardiac rehab.    Allergies  Allergen Reactions  . Bactrim     Current Outpatient Prescriptions  Medication Sig Dispense Refill  . aspirin 81 MG tablet Take 81 mg by mouth daily.        . clopidogrel (PLAVIX) 75 MG tablet Take 75 mg by mouth daily.        . fish oil-omega-3 fatty acids 1000 MG capsule Take 2 g by mouth daily.        Marland Kitchen lisinopril (PRINIVIL,ZESTRIL) 10 MG tablet Take 10 mg by mouth daily.       . metoprolol tartrate (LOPRESSOR) 25 MG tablet Take 25 mg by mouth 2 (two) times daily.        . nitroGLYCERIN (NITROSTAT) 0.4 MG SL tablet Place 0.4 mg under the tongue every 5 (five) minutes as needed.        Marland Kitchen oxyCODONE-acetaminophen (PERCOCET) 5-325 MG per tablet       . phenazopyridine (PYRIDIUM) 200 MG tablet       . simvastatin (ZOCOR) 20 MG tablet Take 20 mg by mouth at bedtime.        Marland Kitchen ibuprofen (ADVIL,MOTRIN) 800 MG tablet       . isosorbide  mononitrate (IMDUR) 15 mg TB24 Take 0.5 tablets (15 mg total) by mouth daily.  30 tablet  3    Past Medical History  Diagnosis Date  . Coronary artery disease August 2012    ST elevation MI  with DES to LAD X 2.  . Tobacco abuse     3 pkg a day to 6 cigarettes a day.  . Hypertension   . Diabetes mellitus     Past Surgical History  Procedure Date  . Cardiac catheterization 10/01/2010    In setting of anterior ST elevation MI. 2 DES placed in the  mid LAD, moderate disease in the RCA and Circ. There was moderate disease between the 2 stents in the mid LAD EF of 45%.    ZOX:WRUEAV of systems complete and found to be negative unless listed above PHYSICAL EXAM BP 152/90  Pulse 52  Resp 18  Ht 5\' 6"  (1.676 m)  Wt 25 lb (11.34 kg)  BMI 4.04 kg/m2  SpO2 98% General: Well developed, well nourished, in no acute distress Head: Eyes PERRLA, No  xanthomas.   Normal cephalic and atramatic  Lungs: Clear bilaterally to auscultation and percussion. Heart: HRRR S1 S2, without MRG.  Pulses are 2+ & equal.            No carotid bruit. No JVD.  No abdominal bruits. No femoral bruits. Abdomen: Bowel sounds are positive, abdomen soft and non-tender without masses or                  Hernia's noted. Msk:  Back normal, normal gait. Normal strength and tone for age. Extremities: No clubbing, cyanosis or edema.  DP +1 Neuro: Alert and oriented X 3. Psych:  Good affect, responds appropriately, but very tearful.    ASSESSMENT AND PLAN

## 2010-10-21 ENCOUNTER — Telehealth: Payer: Self-pay | Admitting: Adult Health

## 2010-10-21 NOTE — Telephone Encounter (Signed)
Christy [name] THAT SHE WOULD GIVE HER LETTER FOR WORK ON 10/17/10 APPOINTMENT. PATIENT FORGOT TO GET IT BEFORE LEAVING.

## 2010-10-29 NOTE — Discharge Summary (Signed)
Christy, [name] NO.:  [id number]  MEDICAL RECORD NO.:  [id number]  LOCATION:  2912                         FACILITY:  [facility]  PHYSICIAN:  Corky Crafts, MDDATE OF BIRTH:  May 07, 1960  DATE OF ADMISSION:  10/01/2010 DATE OF DISCHARGE:  10/02/2010                              DISCHARGE SUMMARY   FINAL DIAGNOSES: 1. Acute anterior ST elevation myocardial infarction. 2. Coronary artery disease. 3. Tobacco abuse. 4. Hypertension.  PROCEDURE PERFORMED:  Cardiac catheterization with 2 drug-eluting stents implanted into the LAD.  HOSPITAL COURSE:  The patient was brought emergently to the cath lab after EMS had done an EKG suspicious for anterior ST elevation.  She had been having chest pain.  Her whole situation was complicated by the fact that 2 days prior to her heart attack, her husband had died in the [facility] CCU after suffering a cardiac arrest a week before.  He had been on the [employer] [employer] protocol and apparently had anoxic brain injury after a VF arrest.  The patient underwent her cath procedure and tolerated that well.  There were no arrhythmias.  She was extremely depressed in the room afterwards.  She wanted to go home as soon as possible.  I did explain to her that 48 hours was the typical time length that people were kept in the hospital at a minimum, however, given her circumstances we discussed letting her go home earlier.  She did spend the night after her cardiac catheterization and felt well Thursday morning.  I saw her in the morning and I asked that she walk in the halls with Cardiac Rehab.  If she walked with them, did not have any angina or any arrhythmia, I would let her go home due to her special circumstances. She expressed understanding.  She understood what the risks of going home early were as well but she repeatedly stated that she had to leave the hospital.  After leaving the room and going to the cath lab to do  a procedure, I received page saying that the patient had just gotten dressed and walked out.  She left AMA.  She did not get any prescriptions.  She did not allow the nurse to call me to try and get her any prescriptions either.  In an effort to get her dual antiplatelet therapy, we tried to contact her relative who works at [employer], she was off.  We then called the emergency contact in the chart which happened to be her daughter, Christy [name]. She explained to me that the patient was very upset and just had to leave the hospital given what it happened with her husband.  I explained the importance of her medication and she understood.  She gave me a pharmacy number to call her prescriptions  DISCHARGE MEDICATIONS: 1. Aspirin 325 mg daily. 2. Plavix 75 mg daily. 3. Lisinopril 10 mg daily. 4. Fish oil 1000 mg 3 capsules daily. 5. Simvastatin 20 mg daily. 6. Metoprolol 25 mg p.o. b.i.d.  ACTIVITY:  She is to follow up with Dr. Eldridge Dace in 3 weeks.  No appointment was scheduled since she left early.  ACTIVITY:  No lifting  more than 10 pounds for about a week.  This was conveyed to her family members.  DIET:  Low-sodium heart-healthy diet.  45 minutes was spent on this discharge trying to track down a family member and initially trying to explain to the patient why I did wanted to walk with Cardiac Rehab.  We also strongly recommended that she stop smoking.     Corky Crafts, MD     JSV/MEDQ  D:  10/13/2010  T:  10/13/2010  Job:  [id number]  Electronically Signed by Lance Muss MD on 10/29/2010 12:34:43 PM

## 2010-11-13 LAB — URINE CULTURE: Colony Count: >=100000

## 2010-11-13 LAB — URINALYSIS, ROUTINE W REFLEX MICROSCOPIC
Glucose, UA: 250 — AB
Ketones, ur: NEGATIVE
Ketones, ur: NEGATIVE
Leukocytes, UA: NEGATIVE
Nitrite: POSITIVE — AB
Protein, ur: >300 — AB
Protein, ur: >300 — AB
Urobilinogen, UA: >8 — ABNORMAL HIGH
pH: 5.5

## 2010-11-13 LAB — URINE MICROSCOPIC-ADD ON

## 2010-11-14 ENCOUNTER — Ambulatory Visit (INDEPENDENT_AMBULATORY_CARE_PROVIDER_SITE_OTHER): Payer: Managed Care, Other (non HMO) | Admitting: Adult Health

## 2010-11-14 ENCOUNTER — Encounter: Payer: Self-pay | Admitting: Adult Health

## 2010-11-14 DIAGNOSIS — I10 Essential (primary) hypertension: Secondary | ICD-10-CM

## 2010-11-14 DIAGNOSIS — I2510 Atherosclerotic heart disease of native coronary artery without angina pectoris: Secondary | ICD-10-CM

## 2010-11-14 DIAGNOSIS — I519 Heart disease, unspecified: Secondary | ICD-10-CM

## 2010-11-14 DIAGNOSIS — Z720 Tobacco use: Secondary | ICD-10-CM

## 2010-11-14 DIAGNOSIS — F17200 Nicotine dependence, unspecified, uncomplicated: Secondary | ICD-10-CM

## 2010-11-14 DIAGNOSIS — Z9861 Coronary angioplasty status: Secondary | ICD-10-CM

## 2010-11-14 NOTE — Patient Instructions (Signed)
Your physician recommends that you continue on your current medications as directed. Please refer to the Current Medication list given to you today.  Your physician recommends that you schedule a follow-up appointment in: 3 months  

## 2010-11-14 NOTE — Assessment & Plan Note (Signed)
Well controlled at present.  No changes in her medications

## 2010-11-14 NOTE — Progress Notes (Signed)
HPI:Christy [name] is a 50 y/o patient new to the office after admission to [facility] hospital in the setting of STEMI.  She was found to have severe LAD stenosis and received 2 DES to the diseased portion of this vessel.  Unfortunately, the patient left AMA after recovering from catheterization. She was recently widowed 2 days before and had just left the hospital after being with her husband for 14 day admission until his death.  She stated that she was too emotional to stay any longer.  She was given medication RX and is now in our office for follow-up.      She was seen a couple of weeks ago, having daily discomfort and I placed her on isosorbide. She was unable to tolerate the medication. She states that she is feeling a lot better, with minimal chest pain.  She is active at home, but has not started cardiac rehab, due to start in 5 days. Remains out of work, and is anxious to return. She goes to [employer] football games, and cleans her house without chest pain, but remains tired at times.  She has only taken NTG once since stopping isosorbide.     Allergies  Allergen Reactions  . Bactrim   . Isosorbide Mononitrate     Current Outpatient Prescriptions  Medication Sig Dispense Refill  . aspirin 81 MG tablet Take 81 mg by mouth daily.        . clopidogrel (PLAVIX) 75 MG tablet Take 75 mg by mouth daily.        . fish oil-omega-3 fatty acids 1000 MG capsule Take 3 g by mouth daily.       Marland Kitchen lisinopril (PRINIVIL,ZESTRIL) 10 MG tablet Take 10 mg by mouth daily.       . metoprolol tartrate (LOPRESSOR) 25 MG tablet Take 25 mg by mouth 2 (two) times daily.        . nitroGLYCERIN (NITROSTAT) 0.4 MG SL tablet Place 0.4 mg under the tongue every 5 (five) minutes as needed.        . simvastatin (ZOCOR) 20 MG tablet Take 20 mg by mouth at bedtime.          Past Medical History  Diagnosis Date  . Coronary artery disease August 2012    ST elevation MI  with DES to LAD X 2.  . Tobacco abuse     3 pkg a day  to 6 cigarettes a day.  . Hypertension   . Diabetes mellitus     Past Surgical History  Procedure Date  . Cardiac catheterization 10/01/2010    In setting of anterior ST elevation MI. 2 DES placed in the  mid LAD, moderate disease in the RCA and Circ. There was moderate disease between the 2 stents in the mid LAD EF of 45%.    ZOX:WRUEAV of systems complete and found to be negative unless listed above PHYSICAL EXAM BP 115/66  Pulse 52  Ht 5\' 5"  (1.651 m)  Wt 226 lb (102.513 kg)  BMI 37.61 kg/m2  SpO2 95% General: Well developed, well nourished, in no acute distress Head: Eyes PERRLA, No xanthomas.   Normal cephalic and atramatic  Lungs: Clear bilaterally to auscultation and percussion. Heart: HRRR S1 S2, without MRG.  Pulses are 2+ & equal. No carotid bruit. No JVD.  No abdominal bruits. No femoral bruits.  Extremities: No clubbing, cyanosis or edema.  DP +1 Neuro: Alert and oriented X 3. Psych:  Good affect, responds appropriately,   ASSESSMENT AND  PLAN

## 2010-11-14 NOTE — Assessment & Plan Note (Signed)
She has improved since last visit.  Unable to tolerate isosorbide and I am okay with her stopping this. She is anxious to return to work, but has not gotten to cardiac rehab yet. I have asked her to wait until at least 2 weeks of cardiac rehab before returning to work. I have asked cardiac rehab to inform me of progress. After 2 weeks a letter of release to return to work will be given to her if she is strong enough based upon their assessment. She will be seen in 3 months unless symptomatic. Repeat Echo at that time.

## 2010-11-14 NOTE — Assessment & Plan Note (Signed)
She is down to half a pack a day, and smoking ultra-lights. She is working on giving this up and feels that returning to work would be helpful in this.

## 2010-11-18 ENCOUNTER — Encounter (HOSPITAL_COMMUNITY): Payer: Self-pay

## 2010-11-18 ENCOUNTER — Encounter (HOSPITAL_COMMUNITY)
Admission: RE | Admit: 2010-11-18 | Discharge: 2010-11-18 | Disposition: A | Discharge status: Home / Self Care | Payer: Managed Care, Other (non HMO) | Source: Ambulatory Visit | Attending: Cardiology | Admitting: Cardiology

## 2010-11-18 DIAGNOSIS — Z9861 Coronary angioplasty status: Secondary | ICD-10-CM | POA: Insufficient documentation

## 2010-11-18 DIAGNOSIS — I252 Old myocardial infarction: Secondary | ICD-10-CM | POA: Insufficient documentation

## 2010-11-18 DIAGNOSIS — I2510 Atherosclerotic heart disease of native coronary artery without angina pectoris: Secondary | ICD-10-CM | POA: Insufficient documentation

## 2010-11-18 DIAGNOSIS — Z5189 Encounter for other specified aftercare: Secondary | ICD-10-CM | POA: Insufficient documentation

## 2010-11-18 NOTE — Progress Notes (Signed)
Orientation completed. Pt is scheduled to start on Monday 11/24/10 at 6:45am. Pt is registered and records have been printed out of e-chart. Pt may be returning to work before finishing the program.

## 2010-11-18 NOTE — Patient Instructions (Signed)
During orientation advised patient on arrival and appointment times what to wear, what to do before, during and after exercise. Reviewed attendance and class policy. Talked about inclement weather and class consultation policy.   

## 2010-11-24 ENCOUNTER — Encounter (HOSPITAL_COMMUNITY)
Admission: RE | Admit: 2010-11-24 | Discharge: 2010-11-24 | Disposition: A | Discharge status: Home / Self Care | Payer: Managed Care, Other (non HMO) | Source: Ambulatory Visit | Attending: Cardiology | Admitting: Cardiology

## 2010-11-25 ENCOUNTER — Encounter (HOSPITAL_COMMUNITY): Payer: Self-pay

## 2010-11-25 NOTE — Progress Notes (Signed)
Encounter addended by: Rolene Course on: 11/25/2010  1:38 PM<BR>     Documentation filed: Chief Complaint Section, Vitals Section

## 2010-11-26 ENCOUNTER — Encounter (HOSPITAL_COMMUNITY)
Admission: RE | Admit: 2010-11-26 | Discharge: 2010-11-26 | Disposition: A | Discharge status: Home / Self Care | Payer: Managed Care, Other (non HMO) | Source: Ambulatory Visit | Attending: Cardiology | Admitting: Cardiology

## 2010-11-28 ENCOUNTER — Encounter (HOSPITAL_COMMUNITY)
Admission: RE | Admit: 2010-11-28 | Discharge: 2010-11-28 | Disposition: A | Discharge status: Home / Self Care | Payer: Managed Care, Other (non HMO) | Source: Ambulatory Visit | Attending: Cardiology | Admitting: Cardiology

## 2010-11-28 NOTE — Progress Notes (Signed)
Went to [employer] to talk to Joni Reining, NP to give progress report on her patient Mrs. Okane. Pt has finished 3 visits. Pt is doing well at a slow pace. Pt walked on the Treadmill for 13 minutes without stopping on Friday due to calf pain. Samara Deist agreed with Cardiac Rehab recommendations of the patient continuing for at least a month to be able to walk on TM for 15 minutes without stopping before going back to work. Hart Rochester, Exercise Physiologist

## 2010-12-01 ENCOUNTER — Encounter: Payer: Self-pay | Admitting: *Deleted

## 2010-12-01 ENCOUNTER — Telehealth: Payer: Self-pay | Admitting: Adult Health

## 2010-12-01 ENCOUNTER — Encounter (HOSPITAL_COMMUNITY): Payer: Managed Care, Other (non HMO)

## 2010-12-01 NOTE — Telephone Encounter (Signed)
Patient has a $75 copay for each rehab visit, as she is in cobra status at this time.  States she would like to return to work full time and feels that she can fulfill the requirements of her job.  May we write her a note to return?

## 2010-12-01 NOTE — Telephone Encounter (Signed)
Have had conversation with cardiac rehab about this. It is okay for patient to go back to work if not lifting over 20 lbs doing strenuous activity. Make sure she has follow-up with [place] within one month after returning to work for check in.  Joni Reining NP

## 2010-12-01 NOTE — Telephone Encounter (Signed)
Patient was set up for rehab and she states she can not continue to go because it cost to much. Patient needs released to go back to work.

## 2010-12-03 ENCOUNTER — Encounter (HOSPITAL_COMMUNITY): Payer: Managed Care, Other (non HMO)

## 2010-12-05 ENCOUNTER — Encounter (HOSPITAL_COMMUNITY): Payer: Managed Care, Other (non HMO)

## 2010-12-08 ENCOUNTER — Encounter (HOSPITAL_COMMUNITY): Payer: Managed Care, Other (non HMO)

## 2010-12-10 ENCOUNTER — Encounter (HOSPITAL_COMMUNITY): Payer: Managed Care, Other (non HMO)

## 2010-12-12 ENCOUNTER — Encounter (HOSPITAL_COMMUNITY): Payer: Managed Care, Other (non HMO)

## 2010-12-15 ENCOUNTER — Encounter (HOSPITAL_COMMUNITY): Payer: Managed Care, Other (non HMO)

## 2010-12-17 ENCOUNTER — Encounter (HOSPITAL_COMMUNITY): Payer: Managed Care, Other (non HMO)

## 2010-12-19 ENCOUNTER — Encounter (HOSPITAL_COMMUNITY): Payer: Managed Care, Other (non HMO)

## 2010-12-22 ENCOUNTER — Encounter (HOSPITAL_COMMUNITY): Payer: Managed Care, Other (non HMO)

## 2010-12-24 ENCOUNTER — Encounter (HOSPITAL_COMMUNITY): Payer: Managed Care, Other (non HMO)

## 2010-12-26 ENCOUNTER — Encounter (HOSPITAL_COMMUNITY): Payer: Managed Care, Other (non HMO)

## 2010-12-29 ENCOUNTER — Encounter (HOSPITAL_COMMUNITY): Payer: Managed Care, Other (non HMO)

## 2010-12-31 ENCOUNTER — Encounter (HOSPITAL_COMMUNITY): Payer: Managed Care, Other (non HMO)

## 2011-01-01 ENCOUNTER — Ambulatory Visit (INDEPENDENT_AMBULATORY_CARE_PROVIDER_SITE_OTHER): Payer: Managed Care, Other (non HMO) | Admitting: Adult Health

## 2011-01-01 ENCOUNTER — Encounter: Payer: Self-pay | Admitting: Adult Health

## 2011-01-01 VITALS — BP 116/67 | HR 57 | Ht 65.0 in | Wt 224.0 lb

## 2011-01-01 DIAGNOSIS — E7800 Pure hypercholesterolemia, unspecified: Secondary | ICD-10-CM

## 2011-01-01 DIAGNOSIS — Z720 Tobacco use: Secondary | ICD-10-CM

## 2011-01-01 DIAGNOSIS — I2510 Atherosclerotic heart disease of native coronary artery without angina pectoris: Secondary | ICD-10-CM

## 2011-01-01 DIAGNOSIS — F17200 Nicotine dependence, unspecified, uncomplicated: Secondary | ICD-10-CM

## 2011-01-01 DIAGNOSIS — I10 Essential (primary) hypertension: Secondary | ICD-10-CM

## 2011-01-01 DIAGNOSIS — I519 Heart disease, unspecified: Secondary | ICD-10-CM

## 2011-01-01 MED ORDER — METOPROLOL TARTRATE 25 MG PO TABS: 25.0000 mg | ORAL_TABLET | Freq: Two times a day (BID) | ORAL | Status: DC

## 2011-01-01 MED ORDER — CLOPIDOGREL BISULFATE 75 MG PO TABS: 75.0000 mg | ORAL_TABLET | Freq: Every day | ORAL | Status: DC

## 2011-01-01 MED ORDER — LISINOPRIL 10 MG PO TABS: 10.0000 mg | ORAL_TABLET | Freq: Every day | ORAL | Status: DC

## 2011-01-01 MED ORDER — SIMVASTATIN 20 MG PO TABS: 20.0000 mg | ORAL_TABLET | Freq: Every day | ORAL | Status: DC

## 2011-01-01 NOTE — Progress Notes (Signed)
HPI:Mrs. Mumford is a 50 y/o patient new to the office after admission to [facility] hospital in the setting of STEMI.  She was found to have severe LAD stenosis and received 2 DES to the diseased portion of this vessel.  Unfortunately, the patient left AMA after recovering from catheterization. She was recently widowed 2 days before and had just left the hospital after being with her husband for 14 day admission until his death.  She stated that she was too emotional to stay any longer.  She was given medication RX and is now in our office for follow-up.  She was placed on imdur but was unable to tolerate this. She did go to cardiac rehab and did well, but has not been able to afford it. She denies recurrence of chest pain and is anxious to return to work. Energy level has returned.     Allergies  Allergen Reactions  . Bactrim   . Isosorbide Mononitrate     Current Outpatient Prescriptions  Medication Sig Dispense Refill  . aspirin 81 MG tablet Take 81 mg by mouth daily.        . clopidogrel (PLAVIX) 75 MG tablet Take 1 tablet (75 mg total) by mouth daily.  90 tablet  3  . fish oil-omega-3 fatty acids 1000 MG capsule Take 3 g by mouth daily.       Marland Kitchen lisinopril (PRINIVIL,ZESTRIL) 10 MG tablet Take 1 tablet (10 mg total) by mouth daily.  90 tablet  3  . metoprolol tartrate (LOPRESSOR) 25 MG tablet Take 1 tablet (25 mg total) by mouth 2 (two) times daily.  90 tablet  3  . nitroGLYCERIN (NITROSTAT) 0.4 MG SL tablet Place 0.4 mg under the tongue every 5 (five) minutes as needed.        . simvastatin (ZOCOR) 20 MG tablet Take 1 tablet (20 mg total) by mouth at bedtime.  90 tablet  3    Past Medical History  Diagnosis Date  . Coronary artery disease August 2012    ST elevation MI  with DES to LAD X 2.  . Tobacco abuse     3 pkg a day to 6 cigarettes a day.  . Hypertension   . Diabetes mellitus     Past Surgical History  Procedure Date  . Cardiac catheterization 10/01/2010    In setting of  anterior ST elevation MI. 2 DES placed in the  mid LAD, moderate disease in the RCA and Circ. There was moderate disease between the 2 stents in the mid LAD EF of 45%.  . Carotid stent 10/01/10    Stentx2    ZOX:WRUEAV of systems complete and found to be negative unless listed above PHYSICAL EXAM BP 116/67  Pulse 57  Ht 5\' 5"  (1.651 m)  Wt 224 lb (101.606 kg)  BMI 37.28 kg/m2 General: Well developed, well nourished, in no acute distress Head: Eyes PERRLA, No xanthomas.   Normal cephalic and atramatic  Lungs: Clear bilaterally to auscultation and percussion. Heart: HRRR S1 S2, without MRG.  Pulses are 2+ & equal. No carotid bruit. No JVD.  No abdominal bruits. No femoral bruits. Extremities: No clubbing, cyanosis or edema.  DP +1 Neuro: Alert and oriented X 3. Psych:  Good affect, responds appropriately,   ASSESSMENT AND PLAN

## 2011-01-01 NOTE — Assessment & Plan Note (Signed)
Currently well controlled on medication regimen.  No changes at this time.

## 2011-01-01 NOTE — Patient Instructions (Signed)
Your physician recommends that you schedule a follow-up appointment in: 6 months (you will receive a letter in the mail)  Your physician recommends that you return for lab work in: Lipids tomorrow  Medications have been refilled this visit.  Return to work November 28th.  Letter provided.

## 2011-01-01 NOTE — Assessment & Plan Note (Signed)
She continues to work on this and returning to work will help with this, as she will not have so much down time.

## 2011-01-01 NOTE — Assessment & Plan Note (Addendum)
She is doing well and asymptomatic. I have given her a letter to return to work without limitations on 01/14/2011. Fasting lipids and LFT's will be drawn. . She is to see [place] in 6 months. I have given her Rx for all of her medications.  She is to call us for any new symptoms or problems. I have advised her

## 2011-01-02 ENCOUNTER — Encounter (HOSPITAL_COMMUNITY): Payer: Managed Care, Other (non HMO)

## 2011-01-05 ENCOUNTER — Encounter (HOSPITAL_COMMUNITY): Payer: Managed Care, Other (non HMO)

## 2011-01-07 ENCOUNTER — Encounter (HOSPITAL_COMMUNITY): Payer: Managed Care, Other (non HMO)

## 2011-01-09 ENCOUNTER — Encounter (HOSPITAL_COMMUNITY): Payer: Managed Care, Other (non HMO)

## 2011-01-12 ENCOUNTER — Encounter (HOSPITAL_COMMUNITY): Payer: Managed Care, Other (non HMO)

## 2011-01-14 ENCOUNTER — Encounter (HOSPITAL_COMMUNITY): Payer: Managed Care, Other (non HMO)

## 2011-01-14 NOTE — Progress Notes (Signed)
Cardiac Rehabilitation Program Progress Report   Orientation:  11/18/10 Graduate Date:  none Discharge Date:  11/28/2010 # of sessions completed: 3  Cardiologist: Judith Gap Bing Family MD:  Geanie Cooley Class Time:  06:45  A.  Exercise Program:  Tolerates exercise @ 1.9 METS for 15 minutes and Discharged  B.  Mental Health:  Good mental attitude  C.  Education/Instruction/Skills  Knows THR for exercise and Uses Perceived Exertion Scale and/or Dyspnea Scale  Uses Perceived Exertion Scale and/or Dyspnea Scale  D.  Nutrition/Weight Control/Body Composition:  Adherence to prescribed nutrition program: good   *This section completed by Mickle Plumb, Andres Shad, RD, LDN, CDE  E.  Blood Lipids    No results found for this basename: CHOL     No results found for this basename: TRIG     No results found for this basename: HDL     No results found for this basename: CHOLHDL     No results found for this basename: LDLDIRECT      F.  Lifestyle Changes:  Making positive lifestyle changes  G.  Symptoms noted with exercise:  Asymptomatic  Report Completed By:  Angelica Pou   Comments:  Patient came to rehab for 3 sessions. She achieved a peak mets of 1.9, Resting HR is 49 and her resting BP is 130/70. Her peak HR is 89 and her peak BP is 140/80. She did not return due to the price of her Co-pay  Of Ins. She could not afford it.

## 2011-01-16 ENCOUNTER — Encounter (HOSPITAL_COMMUNITY): Payer: Managed Care, Other (non HMO)

## 2011-01-19 ENCOUNTER — Encounter (HOSPITAL_COMMUNITY): Payer: Managed Care, Other (non HMO)

## 2011-01-21 ENCOUNTER — Encounter (HOSPITAL_COMMUNITY): Payer: Managed Care, Other (non HMO)

## 2011-01-23 ENCOUNTER — Encounter (HOSPITAL_COMMUNITY): Payer: Managed Care, Other (non HMO)

## 2011-01-26 ENCOUNTER — Encounter (HOSPITAL_COMMUNITY): Payer: Managed Care, Other (non HMO)

## 2011-01-28 ENCOUNTER — Encounter (HOSPITAL_COMMUNITY): Payer: Managed Care, Other (non HMO)

## 2011-01-30 ENCOUNTER — Encounter (HOSPITAL_COMMUNITY): Payer: Managed Care, Other (non HMO)

## 2011-02-02 ENCOUNTER — Encounter (HOSPITAL_COMMUNITY): Payer: Managed Care, Other (non HMO)

## 2011-02-04 ENCOUNTER — Encounter (HOSPITAL_COMMUNITY): Payer: Managed Care, Other (non HMO)

## 2011-02-06 ENCOUNTER — Encounter (HOSPITAL_COMMUNITY): Payer: Managed Care, Other (non HMO)

## 2011-02-09 ENCOUNTER — Encounter (HOSPITAL_COMMUNITY): Payer: Managed Care, Other (non HMO)

## 2011-02-11 ENCOUNTER — Encounter (HOSPITAL_COMMUNITY): Payer: Managed Care, Other (non HMO)

## 2011-02-13 ENCOUNTER — Encounter (HOSPITAL_COMMUNITY): Payer: Managed Care, Other (non HMO)

## 2011-02-16 ENCOUNTER — Encounter (HOSPITAL_COMMUNITY): Payer: Managed Care, Other (non HMO)

## 2011-02-18 ENCOUNTER — Encounter (HOSPITAL_COMMUNITY): Payer: Managed Care, Other (non HMO)

## 2011-02-20 ENCOUNTER — Encounter (HOSPITAL_COMMUNITY): Payer: Managed Care, Other (non HMO)

## 2011-02-23 ENCOUNTER — Encounter (HOSPITAL_COMMUNITY): Payer: Managed Care, Other (non HMO)

## 2011-02-25 ENCOUNTER — Encounter (HOSPITAL_COMMUNITY): Payer: Managed Care, Other (non HMO)

## 2011-02-27 ENCOUNTER — Encounter (HOSPITAL_COMMUNITY): Payer: Managed Care, Other (non HMO)

## 2011-07-03 ENCOUNTER — Encounter: Payer: Self-pay | Admitting: Cardiology

## 2011-07-03 ENCOUNTER — Ambulatory Visit (INDEPENDENT_AMBULATORY_CARE_PROVIDER_SITE_OTHER): Payer: Managed Care, Other (non HMO) | Admitting: Cardiology

## 2011-07-03 VITALS — BP 113/66 | HR 60 | Resp 16 | Ht 66.0 in | Wt 231.0 lb

## 2011-07-03 DIAGNOSIS — E782 Mixed hyperlipidemia: Secondary | ICD-10-CM

## 2011-07-03 DIAGNOSIS — E785 Hyperlipidemia, unspecified: Secondary | ICD-10-CM | POA: Insufficient documentation

## 2011-07-03 DIAGNOSIS — Z720 Tobacco use: Secondary | ICD-10-CM

## 2011-07-03 DIAGNOSIS — E119 Type 2 diabetes mellitus without complications: Secondary | ICD-10-CM | POA: Insufficient documentation

## 2011-07-03 DIAGNOSIS — I2510 Atherosclerotic heart disease of native coronary artery without angina pectoris: Secondary | ICD-10-CM

## 2011-07-03 DIAGNOSIS — F17200 Nicotine dependence, unspecified, uncomplicated: Secondary | ICD-10-CM

## 2011-07-03 DIAGNOSIS — I10 Essential (primary) hypertension: Secondary | ICD-10-CM

## 2011-07-03 MED ORDER — BUPROPION HCL ER (SMOKING DET) 150 MG PO TB12: 150.0000 mg | ORAL_TABLET | Freq: Two times a day (BID) | ORAL | Status: AC

## 2011-07-03 NOTE — Assessment & Plan Note (Signed)
The danger of continued tobacco use was discussed with Christy [name].  She understands the continued attempts to taper usage will likely not meet with success.  She is concerned about possible adverse effects of Chantix but will try Zyban.

## 2011-07-03 NOTE — Assessment & Plan Note (Signed)
Blood pressure control is excellent.  Patient denies that she ever had a history of hypertension.  It may be possible to reduce cardiac medications, which will be considered at her next visit.

## 2011-07-03 NOTE — Progress Notes (Deleted)
Name: Christy [name]    DOB: Aug 09, 1960  Age: 51 y.o.  MR#: [id number]       PCP:  Colette Ribas, MD, MD      Insurance: @PAYORNAME @   CC:    Chief Complaint  Patient presents with  . Appointment    no complaints +MED LIST    VS BP 113/66  Pulse 60  Resp 16  Ht 5\' 6"  (1.676 m)  Wt 231 lb (104.781 kg)  BMI 37.28 kg/m2  Weights Current Weight  07/03/11 231 lb (104.781 kg)  01/01/11 224 lb (101.606 kg)  11/18/10 224 lb 11.2 oz (101.923 kg)    Blood Pressure  BP Readings from Last 3 Encounters:  07/03/11 113/66  01/01/11 116/67  11/18/10 104/58     Admit date:  (Not on file) Last encounter with RMR:  Visit date not found   Allergy Allergies  Allergen Reactions  . Bactrim   . Isosorbide Mononitrate     Current Outpatient Prescriptions  Medication Sig Dispense Refill  . aspirin 81 MG tablet Take 81 mg by mouth daily.        . clopidogrel (PLAVIX) 75 MG tablet Take 1 tablet (75 mg total) by mouth daily.  90 tablet  3  . fish oil-omega-3 fatty acids 1000 MG capsule Take 3 g by mouth daily.       Marland Kitchen lisinopril (PRINIVIL,ZESTRIL) 10 MG tablet Take 1 tablet (10 mg total) by mouth daily.  90 tablet  3  . metoprolol tartrate (LOPRESSOR) 25 MG tablet Take 1 tablet (25 mg total) by mouth 2 (two) times daily.  90 tablet  3  . nitroGLYCERIN (NITROSTAT) 0.4 MG SL tablet Place 0.4 mg under the tongue every 5 (five) minutes as needed.        . simvastatin (ZOCOR) 20 MG tablet Take 1 tablet (20 mg total) by mouth at bedtime.  90 tablet  3    Discontinued Meds:   There are no discontinued medications.  Patient Active Problem List  Diagnoses  . Reactive depression (situational)  . Hypertension  . Tobacco abuse  . Arteriosclerotic cardiovascular disease (ASCVD)  . Diabetes mellitus, type 2    LABS No visits with results within 3 Month(s) from this visit. Latest known visit with results is:  Admission on 10/01/2010, Discharged on 10/02/2010  Component Date Value  .  Sodium 10/01/2010 141   . Potassium 10/01/2010 3.4*  . Chloride 10/01/2010 104   . BUN 10/01/2010 7   . Creatinine, Ser 10/01/2010 0.90   . Glucose, Bld 10/01/2010 131*  . Calcium, Ion 10/01/2010 1.16   . TCO2 10/01/2010 22   . Hemoglobin 10/01/2010 13.3   . HCT 10/01/2010 39.0   . WBC 10/01/2010 7.7   . RBC 10/01/2010 4.13   . Hemoglobin 10/01/2010 13.0   . HCT 10/01/2010 38.6   . MCV 10/01/2010 93.5   . [facility] 10/01/2010 31.5   . MCHC 10/01/2010 33.7   . RDW 10/01/2010 14.0   . Platelets 10/01/2010 231   . Total CK 10/01/2010 105   . CK, MB 10/01/2010 4.5*  . Troponin I 10/01/2010 <0.30   . Relative Index 10/01/2010 4.3*  . Prothrombin Time 10/01/2010 14.2   . INR 10/01/2010 1.08   . aPTT 10/01/2010 33   . Sodium 10/01/2010 137   . Potassium 10/01/2010 3.5   . Chloride 10/01/2010 105   . CO2 10/01/2010 24   . Glucose, Bld 10/01/2010 131*  . BUN 10/01/2010 9   .  Creatinine, Ser 10/01/2010 0.92   . Calcium 10/01/2010 8.7   . Total Protein 10/01/2010 6.9   . Albumin 10/01/2010 2.9*  . AST 10/01/2010 16   . ALT 10/01/2010 12   . Alkaline Phosphatase 10/01/2010 94   . Total Bilirubin 10/01/2010 0.3   . GFR calc non Af Amer 10/01/2010 >60   . GFR calc Af Amer 10/01/2010 >60   . MRSA by PCR 10/01/2010 NEGATIVE   . Hemoglobin A1C 10/01/2010 7.0*  . Mean Plasma Glucose 10/01/2010 154*  . Activated Clotting Time 10/01/2010 386   . Total CK 10/01/2010 116   . CK, MB 10/01/2010 6.6*  . Troponin I 10/01/2010 0.45*  . Relative Index 10/01/2010 5.7*  . WBC 10/02/2010 8.7   . RBC 10/02/2010 4.10   . Hemoglobin 10/02/2010 13.3   . HCT 10/02/2010 39.0   . MCV 10/02/2010 95.1   . [facility] 10/02/2010 32.4   . MCHC 10/02/2010 34.1   . RDW 10/02/2010 13.9   . Platelets 10/02/2010 236   . Sodium 10/02/2010 137   . Potassium 10/02/2010 3.6   . Chloride 10/02/2010 104   . CO2 10/02/2010 24   . Glucose, Bld 10/02/2010 159*  . BUN 10/02/2010 8   . Creatinine, Ser 10/02/2010 0.94   .  Calcium 10/02/2010 9.3   . GFR calc non Af Amer 10/02/2010 >60   . GFR calc Af Amer 10/02/2010 >60   . Total CK 10/02/2010 124   . CK, MB 10/02/2010 7.9*  . Troponin I 10/02/2010 1.08*  . Relative Index 10/02/2010 6.4*     Results for this Opt Visit:     Results for orders placed during the hospital encounter of 10/01/10  POCT I-STAT, CHEM 8      Component Value Range   Sodium 141  135 - 145 (mEq/L)   Potassium 3.4 (*) 3.5 - 5.1 (mEq/L)   Chloride 104  96 - 112 (mEq/L)   BUN 7  6 - 23 (mg/dL)   Creatinine, Ser 1.61  0.50 - 1.10 (mg/dL)   Glucose, Bld 096 (*) 70 - 99 (mg/dL)   Calcium, Ion 0.45  4.09 - 1.32 (mmol/L)   TCO2 22  0 - 100 (mmol/L)   Hemoglobin 13.3  12.0 - 15.0 (g/dL)   HCT 81.1  91.4 - 78.2 (%)  CBC      Component Value Range   WBC 7.7  4.0 - 10.5 (K/uL)   RBC 4.13  3.87 - 5.11 (MIL/uL)   Hemoglobin 13.0  12.0 - 15.0 (g/dL)   HCT 95.6  21.3 - 08.6 (%)   MCV 93.5  78.0 - 100.0 (fL)   MCH 31.5  26.0 - 34.0 (pg)   MCHC 33.7  30.0 - 36.0 (g/dL)   RDW 57.8  46.9 - 62.9 (%)   Platelets 231  150 - 400 (K/uL)  CARDIAC PANEL(CRET KIN+CKTOT+MB+TROPI)      Component Value Range   Total CK 105  7 - 177 (U/L)   CK, MB 4.5 (*) 0.3 - 4.0 (ng/mL)   Troponin I <0.30  <0.30 (ng/mL)   Relative Index 4.3 (*) 0.0 - 2.5   PROTIME-INR      Component Value Range   Prothrombin Time 14.2  11.6 - 15.2 (seconds)   INR 1.08  0.00 - 1.49   APTT      Component Value Range   aPTT 33  24 - 37 (seconds)  COMPREHENSIVE METABOLIC PANEL      Component Value Range  Sodium 137  135 - 145 (mEq/L)   Potassium 3.5  3.5 - 5.1 (mEq/L)   Chloride 105  96 - 112 (mEq/L)   CO2 24  19 - 32 (mEq/L)   Glucose, Bld 131 (*) 70 - 99 (mg/dL)   BUN 9  6 - 23 (mg/dL)   Creatinine, Ser 4.78  0.50 - 1.10 (mg/dL)   Calcium 8.7  8.4 - 29.5 (mg/dL)   Total Protein 6.9  6.0 - 8.3 (g/dL)   Albumin 2.9 (*) 3.5 - 5.2 (g/dL)   AST 16  0 - 37 (U/L)   ALT 12  0 - 35 (U/L)   Alkaline Phosphatase 94  39 - 117  (U/L)   Total Bilirubin 0.3  0.3 - 1.2 (mg/dL)   GFR calc non Af Amer >60  >60 (mL/min)   GFR calc Af Amer >60  >60 (mL/min)  MRSA PCR SCREENING      Component Value Range   MRSA by PCR NEGATIVE  NEGATIVE   HEMOGLOBIN A1C      Component Value Range   Hemoglobin A1C 7.0 (*) <5.7 (%)   Mean Plasma Glucose 154 (*) <117 (mg/dL)  POCT ACTIVATED CLOTTING TIME      Component Value Range   Activated Clotting Time 386    CARDIAC PANEL(CRET KIN+CKTOT+MB+TROPI)      Component Value Range   Total CK 116  7 - 177 (U/L)   CK, MB 6.6 (*) 0.3 - 4.0 (ng/mL)   Troponin I 0.45 (*) <0.30 (ng/mL)   Relative Index 5.7 (*) 0.0 - 2.5   CBC      Component Value Range   WBC 8.7  4.0 - 10.5 (K/uL)   RBC 4.10  3.87 - 5.11 (MIL/uL)   Hemoglobin 13.3  12.0 - 15.0 (g/dL)   HCT 62.1  30.8 - 65.7 (%)   MCV 95.1  78.0 - 100.0 (fL)   MCH 32.4  26.0 - 34.0 (pg)   MCHC 34.1  30.0 - 36.0 (g/dL)   RDW 84.6  96.2 - 95.2 (%)   Platelets 236  150 - 400 (K/uL)  BASIC METABOLIC PANEL      Component Value Range   Sodium 137  135 - 145 (mEq/L)   Potassium 3.6  3.5 - 5.1 (mEq/L)   Chloride 104  96 - 112 (mEq/L)   CO2 24  19 - 32 (mEq/L)   Glucose, Bld 159 (*) 70 - 99 (mg/dL)   BUN 8  6 - 23 (mg/dL)   Creatinine, Ser 8.41  0.50 - 1.10 (mg/dL)   Calcium 9.3  8.4 - 32.4 (mg/dL)   GFR calc non Af Amer >60  >60 (mL/min)   GFR calc Af Amer >60  >60 (mL/min)  CARDIAC PANEL(CRET KIN+CKTOT+MB+TROPI)      Component Value Range   Total CK 124  7 - 177 (U/L)   CK, MB 7.9 (*) 0.3 - 4.0 (ng/mL)   Troponin I 1.08 (*) <0.30 (ng/mL)   Relative Index 6.4 (*) 0.0 - 2.5     EKG Orders placed during the hospital encounter of 10/01/10  . EKG  . EKG  . EKG     Prior Assessment and Plan Problem List as of 07/03/2011          Cardiology Problems   Hypertension   Last Assessment & Plan Note   01/01/2011 Office Visit Signed 01/01/2011 11:58 AM by Jodelle Gross, NP    Currently well controlled on medication regimen.  No  changes at this time.    Arteriosclerotic cardiovascular disease (ASCVD)     Other   Reactive depression (situational)   Last Assessment & Plan Note   10/17/2010 Office Visit Signed 10/17/2010  5:24 PM by Jodelle Gross, NP    She is advised to keep close to her support system during this acute mourning process and seek counseling should she become unable to cope.    Tobacco abuse   Last Assessment & Plan Note   01/01/2011 Office Visit Signed 01/01/2011 11:58 AM by Jodelle Gross, NP    She continues to work on this and returning to work will help with this, as she will not have so much down time.     Diabetes mellitus, type 2       Imaging: No results found.   FRS Calculation: Score not calculated. Missing: Total Cholesterol, HDL

## 2011-07-03 NOTE — Patient Instructions (Addendum)
Your physician recommends that you schedule a follow-up appointment in: 5 months  Your physician recommends that you return for lab work in: As soon as you can within the week  Your physician has recommended you make the following change in your medication:  1 - START Zyban as instructed and stop smoking

## 2011-07-03 NOTE — Assessment & Plan Note (Signed)
No prior lipid profile is available.  One will be obtained and pharmacologic treatment adjusted appropriately.

## 2011-07-03 NOTE — Assessment & Plan Note (Addendum)
No current symptoms to suggest active myocardial ischemia.  We will continue to focus on optimal control of cardiovascular risk factors as detailed below.  I anticipate that clopidogrel can be discontinued at her next office visit.

## 2011-07-03 NOTE — Progress Notes (Signed)
Patient ID: Christy [name], female   DOB: 08/08/1960, 51 y.o.   MRN: [id number]  HPI: Scheduled return visit for this nice woman who is 9 months out from an acute myocardial infarction requiring placement of 2 DES in the LAD.  She has done fine from a cardiac standpoint with rare episodes of chest discomfort for which he takes nitroglycerin, normal blood pressure, but no recent assessment of serum lipids and continuing cigarette smoking albeit at a decreased rate of 1/2 pack per day.  Prior to Admission medications   Medication Sig Start Date End Date Taking? Authorizing Provider  aspirin 81 MG tablet Take 81 mg by mouth daily.     Yes Historical Provider, MD  clopidogrel (PLAVIX) 75 MG tablet Take 1 tablet (75 mg total) by mouth daily. 01/01/11  Yes Jodelle Gross, NP  fish oil-omega-3 fatty acids 1000 MG capsule Take 3 g by mouth daily.    Yes Historical Provider, MD  lisinopril (PRINIVIL,ZESTRIL) 10 MG tablet Take 1 tablet (10 mg total) by mouth daily. 01/01/11  Yes Jodelle Gross, NP  metoprolol tartrate (LOPRESSOR) 25 MG tablet Take 1 tablet (25 mg total) by mouth 2 (two) times daily. 01/01/11  Yes Jodelle Gross, NP  nitroGLYCERIN (NITROSTAT) 0.4 MG SL tablet Place 0.4 mg under the tongue every 5 (five) minutes as needed.     Yes Historical Provider, MD  simvastatin (ZOCOR) 20 MG tablet Take 1 tablet (20 mg total) by mouth at bedtime. 01/01/11  Yes Jodelle Gross, NP   Allergies  Allergen Reactions  . Bactrim   . Isosorbide Mononitrate      Past medical history, social history, and family history reviewed and updated.  ROS: Denies orthopnea, PND, pedal edema, palpitations, lightheadedness or syncope.  All other systems reviewed and are negative.  PHYSICAL EXAM: BP 113/66  Pulse 60  Resp 16  Ht 5\' 6"  (1.676 m)  Wt 104.781 kg (231 lb)  BMI 37.28 kg/m2  General-Well developed; no acute distress Body habitus-Moderately overweight Neck-No JVD; no carotid  bruits Lungs-clear lung fields; resonant to percussion Cardiovascular-normal PMI; normal S1 and S2 Abdomen-normal bowel sounds; soft and non-tender without masses or organomegaly Musculoskeletal-No deformities, no cyanosis or clubbing Neurologic-Normal cranial nerves; symmetric strength and tone Skin-Warm, no significant lesions Extremities-distal pulses intact; no edema  EKG: Tracing performed 09/2010 reviewed: Normal sinus rhythm, borderline left atrial abnormality, borderline first-degree AV block, leftward axis, probable prior septal myocardial infarction, QT prolongation.  ASSESSMENT AND PLAN:  Keaau Bing, MD 07/03/2011 12:10 PM

## 2011-07-14 ENCOUNTER — Other Ambulatory Visit: Payer: Self-pay | Admitting: Cardiology

## 2011-07-14 LAB — LIPID PANEL
Cholesterol: 133 mg/dL (ref 0–200)
HDL: 29 mg/dL — ABNORMAL LOW (ref >39)
Total CHOL/HDL Ratio: 4.6 Ratio
VLDL: 42 mg/dL — ABNORMAL HIGH (ref 0–40)

## 2011-07-14 LAB — COMPREHENSIVE METABOLIC PANEL
BUN: 19 mg/dL (ref 6–23)
CO2: 24 mEq/L (ref 19–32)
Calcium: 9.5 mg/dL (ref 8.4–10.5)
Chloride: 105 mEq/L (ref 96–112)
Creat: 1.16 mg/dL — ABNORMAL HIGH (ref 0.50–1.10)
Total Bilirubin: 0.8 mg/dL (ref 0.3–1.2)

## 2011-07-14 LAB — CBC
HCT: 42.6 % (ref 36.0–46.0)
MCH: 31.1 pg (ref 26.0–34.0)
MCV: 90.8 fL (ref 78.0–100.0)
RBC: 4.69 MIL/uL (ref 3.87–5.11)
RDW: 14.6 % (ref 11.5–15.5)
WBC: 8.7 10*3/uL (ref 4.0–10.5)

## 2011-07-17 ENCOUNTER — Encounter: Payer: Self-pay | Admitting: Cardiology

## 2011-08-15 ENCOUNTER — Emergency Department (HOSPITAL_COMMUNITY)
Admission: EM | Admit: 2011-08-15 | Discharge: 2011-08-15 | Disposition: A | Discharge status: Home / Self Care | Payer: Managed Care, Other (non HMO) | Attending: Emergency Medicine | Admitting: Emergency Medicine

## 2011-08-15 ENCOUNTER — Encounter (HOSPITAL_COMMUNITY): Payer: Self-pay

## 2011-08-15 DIAGNOSIS — Z79899 Other long term (current) drug therapy: Secondary | ICD-10-CM | POA: Insufficient documentation

## 2011-08-15 DIAGNOSIS — Z9861 Coronary angioplasty status: Secondary | ICD-10-CM | POA: Insufficient documentation

## 2011-08-15 DIAGNOSIS — I252 Old myocardial infarction: Secondary | ICD-10-CM | POA: Insufficient documentation

## 2011-08-15 DIAGNOSIS — I2510 Atherosclerotic heart disease of native coronary artery without angina pectoris: Secondary | ICD-10-CM | POA: Insufficient documentation

## 2011-08-15 DIAGNOSIS — Z888 Allergy status to other drugs, medicaments and biological substances status: Secondary | ICD-10-CM | POA: Insufficient documentation

## 2011-08-15 DIAGNOSIS — F17200 Nicotine dependence, unspecified, uncomplicated: Secondary | ICD-10-CM | POA: Insufficient documentation

## 2011-08-15 DIAGNOSIS — L97519 Non-pressure chronic ulcer of other part of right foot with unspecified severity: Secondary | ICD-10-CM

## 2011-08-15 DIAGNOSIS — Z7982 Long term (current) use of aspirin: Secondary | ICD-10-CM | POA: Insufficient documentation

## 2011-08-15 DIAGNOSIS — E119 Type 2 diabetes mellitus without complications: Secondary | ICD-10-CM | POA: Insufficient documentation

## 2011-08-15 DIAGNOSIS — I10 Essential (primary) hypertension: Secondary | ICD-10-CM | POA: Insufficient documentation

## 2011-08-15 DIAGNOSIS — Z882 Allergy status to sulfonamides status: Secondary | ICD-10-CM | POA: Insufficient documentation

## 2011-08-15 DIAGNOSIS — IMO0002 Reserved for concepts with insufficient information to code with codable children: Secondary | ICD-10-CM | POA: Insufficient documentation

## 2011-08-15 DIAGNOSIS — X58XXXA Exposure to other specified factors, initial encounter: Secondary | ICD-10-CM | POA: Insufficient documentation

## 2011-08-15 MED ORDER — DOXYCYCLINE HYCLATE 100 MG PO CAPS: 100.0000 mg | ORAL_CAPSULE | Freq: Two times a day (BID) | ORAL | Status: AC

## 2011-08-15 MED ORDER — DOXYCYCLINE HYCLATE 100 MG PO TABS
100.0000 mg | ORAL_TABLET | Freq: Once | ORAL | Status: AC
  Administered 2011-08-15: 100 mg via ORAL
  Filled 2011-08-15: qty 1

## 2011-08-15 NOTE — ED Notes (Signed)
Pt wants area to right foot between 3rd and 4th toe checked, "wore the wrong shoes and think its infected"

## 2011-08-15 NOTE — ED Notes (Signed)
Patient does not need anything at this time. 

## 2011-08-15 NOTE — ED Provider Notes (Signed)
Medical screening examination/treatment/procedure(s) were performed by non-physician practitioner and as supervising physician I was immediately available for consultation/collaboration. Arti Trang Montelongo, MD, FACEP   Misheel Gowans L Alinna Siple, MD 08/15/11 1618 

## 2011-08-15 NOTE — Discharge Instructions (Signed)
Please soak the right foot in a double warm salt water for approximately 15 minutes daily until the wound of the toe heals. Please use clean white socks daily until the wound heals. Monitor your glucose closely. Doxycycline 1 tablet twice daily with food until all taken. Please see your primary physician for recheck in the office.Diabetes and Foot Care Diabetes may cause you to have a poor blood supply (circulation) to your legs and feet. Because of this, the skin may be thinner, break easier, and heal more slowly. You also may have nerve damage in your legs and feet causing decreased feeling. You may not notice minor injuries to your feet that could lead to serious problems or infections. Taking care of your feet is one of the most important things you can do for yourself.  HOME CARE INSTRUCTIONS  Do not go barefoot. Bare feet are easily injured.   Check your feet daily for blisters, cuts, and redness.   Wash your feet with warm water (not hot) and mild soap. Pat your feet and between your toes until completely dry.   Apply a moisturizing lotion that does not contain alcohol or petroleum jelly to the dry skin on your feet and to dry brittle toenails. Do not put it between your toes.   Trim your toenails straight across. Do not dig under them or around the cuticle.   Do not cut corns or calluses, or try to remove them with medicine.   Wear clean cotton socks or stockings every day. Make sure they are not too tight. Do not wear knee high stockings since they may decrease blood flow to your legs.   Wear leather shoes that fit properly and have enough cushioning. To break in new shoes, wear them just a few hours a day to avoid injuring your feet.   Wear shoes at all times, even in the house.   Do not cross your legs. This may decrease the blood flow to your feet.   If you find a minor scrape, cut, or break in the skin on your feet, keep it and the skin around it clean and dry. These areas may be  cleansed with mild soap and water. Do not use peroxide, alcohol, iodine or Merthiolate.   When you remove an adhesive bandage, be sure not to harm the skin around it.   If you have a wound, look at it several times a day to make sure it is healing.   Do not use heating pads or hot water bottles. Burns can occur. If you have lost feeling in your feet or legs, you may not know it is happening until it is too late.   Report any cuts, sores or bruises to your caregiver. Do not wait!  SEEK MEDICAL CARE IF:   You have an injury that is not healing or you notice redness, numbness, burning, or tingling.   Your feet always feel cold.   You have pain or cramps in your legs and feet.  SEEK IMMEDIATE MEDICAL CARE IF:   There is increasing redness, swelling, or increasing pain in the wound.   There is a red line that goes up your leg.   Pus is coming from a wound.   You develop an unexplained oral temperature above 102 F (38.9 C), or as your caregiver suggests.   You notice a bad smell coming from an ulcer or wound.  MAKE SURE YOU:   Understand these instructions.   Will watch your condition.  Will get help right away if you are not doing well or get worse.  Document Released: 01/31/2000 Document Revised: 01/22/2011 Document Reviewed: 08/08/2008 [facility] Patient Information 2012 [place].

## 2011-08-15 NOTE — ED Provider Notes (Signed)
History     CSN: [id number]  Arrival date & time 08/15/11  4098   First MD Initiated Contact with Patient 08/15/11 [phone]      Chief Complaint  Patient presents with  . Wound Check    (Consider location/radiation/quality/duration/timing/severity/associated sxs/prior treatment) HPI Comments: Patient states that approximately a week ago she was weed eating with a pair of old shoes on, and developed a blister between the right third and fourth toe. The patient states she treated with home remedies. But began to notice some redness of the toe, and became concerned because of her diabetes status. It is also of note on the toe next to the injured toe (fourth toe) she has a callus that she pulled a piece of skin off of some time packed in the past. The patient denies any temperature elevation. She denies any excessive pain of the foot. She's not had excessive drainage from the wound area. She has not checked her glucose levels daily, so she does not have an idea of where her diabetes status is at this point. She has only taken over-the-counter antibiotic creams and soaks for this problem.  Patient is a 51 y.o. female presenting with wound check. The history is provided by the patient.  Wound Check     Past Medical History  Diagnosis Date  . 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  . Tobacco abuse     60-90-pack-year history; 1/3 pack per day in 07/24/2011  . Hypertension   . Diabetes mellitus, type 2   . Depression 07-24-2010    Following sudden death of husband    Past Surgical History  Procedure Date  . Coronary angioplasty with stent placement 10/01/10    DES x2  . Cholecystectomy   . Bilateral oophorectomy July 23, 2009    for benign mass    Family History  Problem Relation Age of Onset  . Heart disease Father   . Heart disease Mother     History  Substance Use Topics  . Smoking status: Current Everyday Smoker -- 3.0 packs/day for 30  years    Types: Cigarettes  . Smokeless tobacco: Not on file  . Alcohol Use: No    OB History    Grav Para Term Preterm Abortions TAB SAB Ect Mult Living                  Review of Systems  Constitutional: Negative for activity change.       All ROS Neg except as noted in HPI  HENT: Negative for nosebleeds and neck pain.   Eyes: Negative for photophobia and discharge.  Respiratory: Negative for cough, shortness of breath and wheezing.   Cardiovascular: Positive for chest pain. Negative for palpitations.  Gastrointestinal: Negative for abdominal pain and blood in stool.  Genitourinary: Negative for dysuria, frequency and hematuria.  Musculoskeletal: Negative for back pain and arthralgias.  Skin: Negative.   Neurological: Negative for dizziness, seizures and speech difficulty.  Psychiatric/Behavioral: Negative for hallucinations and confusion.       Depression    Allergies  Bactrim and Isosorbide mononitrate  Home Medications   Current Outpatient Rx  Name Route Sig Dispense Refill  . ASPIRIN 81 MG PO TABS Oral Take 81 mg by mouth daily.      Marland Kitchen CLOPIDOGREL BISULFATE 75 MG PO TABS Oral Take 1 tablet (75 mg total) by mouth daily. 90 tablet 3  . OMEGA-3 FATTY ACIDS 1000  MG PO CAPS Oral Take 3 g by mouth daily.     Marland Kitchen LISINOPRIL 10 MG PO TABS Oral Take 1 tablet (10 mg total) by mouth daily. 90 tablet 3  . METOPROLOL TARTRATE 25 MG PO TABS Oral Take 1 tablet (25 mg total) by mouth 2 (two) times daily. 90 tablet 3  . NITROGLYCERIN 0.4 MG SL SUBL Sublingual Place 0.4 mg under the tongue every 5 (five) minutes as needed.     Marland Kitchen SIMVASTATIN 20 MG PO TABS Oral Take 1 tablet (20 mg total) by mouth at bedtime. 90 tablet 3    BP 142/57  Pulse 68  Temp 98.4 F (36.9 C) (Oral)  Resp 20  Ht 5\' 5"  (1.651 m)  Wt 240 lb (108.863 kg)  BMI 39.94 kg/m2  SpO2 98%  Physical Exam  Nursing note and vitals reviewed. Constitutional: She is oriented to person, place, and time. She appears  well-developed and well-nourished.  Non-toxic appearance.  HENT:  Head: Normocephalic.  Right Ear: Tympanic membrane and external ear normal.  Left Ear: Tympanic membrane and external ear normal.  Eyes: EOM and lids are normal. Pupils are equal, round, and reactive to light.  Neck: Normal range of motion. Neck supple. Carotid bruit is not present.  Cardiovascular: Normal rate, regular rhythm, normal heart sounds, intact distal pulses and normal pulses.   Pulmonary/Chest: No respiratory distress. She has wheezes.  Abdominal: Soft. Bowel sounds are normal. There is no tenderness. There is no guarding.  Musculoskeletal: Normal range of motion.       The dorsalis pedis pulses are symmetrical. There is good capillary refill bilaterally. There is a small ulcer between the third and fourth toe on the right. Mild increase in redness noted but no drainage at this time. There is a scabbed area on the dorsal portion of the fourth right toe. Mild increased redness present, minimal tenderness. There is no red streaking noted of the foot. There is no unusual swelling of the dorsum of the right foot. The Achilles tendon is intact. No lower leg tenderness or swelling.  Lymphadenopathy:       Head (right side): No submandibular adenopathy present.       Head (left side): No submandibular adenopathy present.    She has no cervical adenopathy.  Neurological: She is alert and oriented to person, place, and time. She has normal strength. No cranial nerve deficit or sensory deficit.  Skin: Skin is warm and dry.  Psychiatric: She has a normal mood and affect. Her speech is normal.    ED Course  Procedures (including critical care time)  Labs Reviewed  GLUCOSE, CAPILLARY - Abnormal; Notable for the following:    Glucose-Capillary 125 (*)     All other components within normal limits   No results found. Pulse oximetry 98% on room air. Within normal limits by my interpretation.  No diagnosis found.    MDM    I have reviewed nursing notes, vital signs, and all appropriate lab and imaging results for this patient. Capillary blood glucose at 125 today. The wound to the right third toe was evaluated. The patient is placed on doxycycline one tablet twice daily. Patient strongly advised to see her primary physician for recheck of this in 3-4 days. Patient invited to return to the emergency department if any changes, problems, or concerns.       Kathie Dike, [place] 08/15/11 1025

## 2011-10-05 ENCOUNTER — Other Ambulatory Visit (HOSPITAL_COMMUNITY): Payer: Self-pay | Admitting: Internal Medicine

## 2011-10-05 DIAGNOSIS — I70219 Atherosclerosis of native arteries of extremities with intermittent claudication, unspecified extremity: Secondary | ICD-10-CM

## 2011-10-07 ENCOUNTER — Ambulatory Visit (HOSPITAL_COMMUNITY)
Admission: RE | Admit: 2011-10-07 | Discharge: 2011-10-07 | Disposition: A | Discharge status: Home / Self Care | Payer: Managed Care, Other (non HMO) | Source: Ambulatory Visit | Attending: Internal Medicine | Admitting: Internal Medicine

## 2011-10-07 DIAGNOSIS — I70219 Atherosclerosis of native arteries of extremities with intermittent claudication, unspecified extremity: Secondary | ICD-10-CM

## 2011-10-22 ENCOUNTER — Encounter: Payer: Self-pay | Admitting: Vascular Surgery

## 2011-10-23 ENCOUNTER — Encounter: Payer: Managed Care, Other (non HMO) | Admitting: Vascular Surgery

## 2011-12-08 ENCOUNTER — Ambulatory Visit: Payer: Managed Care, Other (non HMO) | Admitting: Cardiology

## 2011-12-17 ENCOUNTER — Encounter: Payer: Self-pay | Admitting: Physician Assistant

## 2011-12-17 ENCOUNTER — Ambulatory Visit (INDEPENDENT_AMBULATORY_CARE_PROVIDER_SITE_OTHER): Payer: Managed Care, Other (non HMO) | Admitting: Physician Assistant

## 2011-12-17 VITALS — BP 131/83 | HR 54 | Ht 65.0 in | Wt 242.1 lb

## 2011-12-17 DIAGNOSIS — I2510 Atherosclerotic heart disease of native coronary artery without angina pectoris: Secondary | ICD-10-CM

## 2011-12-17 DIAGNOSIS — E785 Hyperlipidemia, unspecified: Secondary | ICD-10-CM

## 2011-12-17 NOTE — Assessment & Plan Note (Signed)
Exercise and weight loss is recommended.

## 2011-12-17 NOTE — Progress Notes (Signed)
HPI:  This is a 51 year old white female patient of [name] who had an acute MI treated with 2 drug-eluting stents to the LAD in 09/2010. Unfortunately she continues to smoke cigarettes a half pack a day. She was given Zyban but says this causes depression and bad dreams she quit taking it. She really has no intention of quitting. She does not exercise on a regular basis but does mow her lawn and do the weed eating without chest pain. She denies any chest pain, palpitations, dyspnea, dyspnea on exertion, dizziness, or presyncope.   Allergies:  -- Bactrim -- Diarrhea  -- Isosorbide Mononitrate -- Other (See Comments)   --  Made patient feel as though she was about to have            another MI  Current Outpatient Prescriptions on File Prior to Visit: aspirin 81 MG tablet, Take 81 mg by mouth daily.  , Disp: , Rfl:  clopidogrel (PLAVIX) 75 MG tablet, Take 75 mg by mouth daily., Disp: , Rfl:   fish oil-omega-3 fatty acids 1000 MG capsule, Take 3 g by mouth daily. , Disp: , Rfl:  glipiZIDE (GLUCOTROL) 5 MG tablet, Take 5 mg by mouth daily., Disp: , Rfl:  metoprolol tartrate (LOPRESSOR) 25 MG tablet, Take 25 mg by mouth 2 (two) times daily., Disp: , Rfl:  nitroGLYCERIN (NITROSTAT) 0.4 MG SL tablet, Place 0.4 mg under the tongue every 5 (five) minutes as needed. 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   Tobacco abuse                                                  Comment:60-90-pack-year history; 1/3 pack per day in               2013   Hypertension                                                 Diabetes mellitus, type 2                                    Depression                                      2012           Comment:Following sudden death of husband  Past Surgical History:   CORONARY  ANGIOPLASTY WITH STENT PLACEMENT       10/01/10        Comment:DES x2   CHOLECYSTECTOMY                                              BILATERAL OOPHORECTOMY  2011           Comment:for benign mass  Review of patient's family history indicates:   Heart disease                  Father                   Heart disease                  Mother                   Social History   Marital Status: Widowed             Spouse Name:                      Years of Education:                 Number of children:             Occupational History   None on file  Social History Main Topics   Smoking Status: Current Every Day Smoker        Packs/Day: 3     Years: 30        Types: Cigarettes   Smokeless Status: Not on file                      Alcohol Use: No             Drug Use: No             Sexual Activity: No                 Other Topics            Concern   None on file  Social History Narrative   None on file    ROS:see history of present illness otherwise negative   PHYSICAL EXAM: Obese, in no acute distress. Neck: No JVD, HJR, Bruit, or thyroid enlargement  Lungs: No tachypnea, clear without wheezing, rales, or rhonchi  Cardiovascular: RRR, PMI not displaced, positive S4 and 2/6 systolic murmur at the left sternal border, no bruit, thrill, or heave.  Abdomen: BS normal. Soft without organomegaly, masses, lesions or tenderness.  Extremities: without cyanosis, clubbing or edema. Good distal pulses bilateral  SKin: Warm, no lesions or rashes   Musculoskeletal: No deformities  Neuro: no focal signs  BP 131/83  Pulse 54  Ht 5\' 5"  (1.651 m)  Wt 242 lb 1.9 oz (109.825 kg)  BMI 40.29 kg/m2   WUJ:WJXBJ bradycardia with first degree AV block, nonspecific ST-T wave changes,no acute change.

## 2011-12-17 NOTE — Patient Instructions (Addendum)
Follow-up with [name] in 6 months  Continue your current medications as directed.  Your physician recommends that you return for fasting lab work  Your physician discussed the hazards of tobacco use. Tobacco use cessation is recommended and techniques and options to help you quit were discussed.  Your physician encouraged you to lose weight for better health. A weight loss of 1-2 pounds per week until your reach your goal weight is recommended.  Your physician discussed the importance of regular exercise and recommended that you start or continue a regular exercise program for good health. Low Fat and Cholesterol Control Diet  Cholesterol levels in your body are determined significantly by your diet. Cholesterol levels may also be related to heart disease. The following material helps to explain this relationship and discusses what you can do to help keep your heart healthy. Not all cholesterol is bad. Low-density lipoprotein (LDL) cholesterol is the "bad" cholesterol. It may cause fatty deposits to build up inside your arteries. High-density lipoprotein (HDL) cholesterol is "good." It helps to remove the "bad" LDL cholesterol from your blood. Cholesterol is a very important risk factor for heart disease. Other risk factors are high blood pressure, smoking, stress, heredity, and weight. The heart muscle gets its supply of blood through the coronary arteries. If your LDL cholesterol is high and your HDL cholesterol is low, you are at risk for having fatty deposits build up in your coronary arteries. This leaves less room through which blood can flow. Without sufficient blood and oxygen, the heart muscle cannot function properly and you may feel chest pains (angina pectoris). When a coronary artery closes up entirely, a part of the heart muscle may die causing a heart attack (myocardial infarction). CHECKING CHOLESTEROL When your caregiver sends your blood to a lab to be examined for cholesterol,  a complete lipid (fat) profile may be done. With this test, the total amount of cholesterol and levels of LDL and HDL are determined. Triglycerides are a type of fat that circulates in the blood. They can also be used to determine heart disease risk. The list below describes what the numbers should be: Test: Total Cholesterol.  Less than 200 mg/dl. Test: LDL "bad cholesterol."  Less than 100 mg/dl.  Less than 70 mg/dl if you are at very high risk of a heart attack or sudden cardiac death. Test: HDL "good cholesterol."  Greater than 50 mg/dl for women.  Greater than 40 mg/dl for men. Test: Triglycerides.  Less than 150 mg/dl. CONTROLLING CHOLESTEROL WITH DIET Although exercise and lifestyle factors are important, your diet is key. That is because certain foods are known to raise cholesterol and others to lower it. The goal is to balance foods for their effect on cholesterol and more importantly, to replace saturated and trans fat with other types of fat, such as monounsaturated fat, polyunsaturated fat, and omega-3 fatty acids. On average, a person should consume no more than 15 to 17 g of saturated fat daily. Saturated and trans fats are considered "bad" fats, and they will raise LDL cholesterol. Saturated fats are primarily found in animal products such as meats, butter, and cream. However, that does not mean you need to give up all your favorite foods. Today, there are good tasting, low-fat, low-cholesterol substitutes for most of the things you like to eat. Choose low-fat or nonfat alternatives. Choose round or loin cuts of red meat. These types of cuts are lowest in fat and cholesterol. Chicken (without the skin), fish, veal, and  ground [place] breast are great choices. Eliminate fatty meats, such as hot dogs and salami. Even shellfish have little or no saturated fat. Have a 3 oz (85 g) portion when you eat lean meat, poultry, or fish. Trans fats are also called "partially hydrogenated oils."  They are oils that have been scientifically manipulated so that they are solid at room temperature resulting in a longer shelf life and improved taste and texture of foods in which they are added. Trans fats are found in stick margarine, some tub margarines, cookies, crackers, and baked goods.  When baking and cooking, oils are a great substitute for butter. The monounsaturated oils are especially beneficial since it is believed they lower LDL and raise HDL. The oils you should avoid entirely are saturated tropical oils, such as coconut and palm.  Remember to eat a lot from food groups that are naturally free of saturated and trans fat, including fish, fruit, vegetables, beans, grains (barley, rice, couscous, bulgur wheat), and pasta (without cream sauces).  IDENTIFYING FOODS THAT LOWER CHOLESTEROL  Soluble fiber may lower your cholesterol. This type of fiber is found in fruits such as apples, vegetables such as broccoli, potatoes, and carrots, legumes such as beans, peas, and lentils, and grains such as barley. Foods fortified with plant sterols (phytosterol) may also lower cholesterol. You should eat at least 2 g per day of these foods for a cholesterol lowering effect.  Read package labels to identify low-saturated fats, trans fat free, and low-fat foods at the supermarket. Select cheeses that have only 2 to 3 g saturated fat per ounce. Use a heart-healthy tub margarine that is free of trans fats or partially hydrogenated oil. When buying baked goods (cookies, crackers), avoid partially hydrogenated oils. Breads and muffins should be made from whole grains (whole-wheat or whole oat flour, instead of "flour" or "enriched flour"). Buy non-creamy canned soups with reduced salt and no added fats.  FOOD PREPARATION TECHNIQUES  Never deep-fry. If you must fry, either stir-fry, which uses very little fat, or use non-stick cooking sprays. When possible, broil, bake, or roast meats, and steam vegetables. Instead of  putting butter or margarine on vegetables, use lemon and herbs, applesauce, and cinnamon (for squash and sweet potatoes), nonfat yogurt, salsa, and low-fat dressings for salads.  LOW-SATURATED FAT / LOW-FAT FOOD SUBSTITUTES Meats / Saturated Fat (g)  Avoid: Steak, marbled (3 oz/85 g) / 11 g  Choose: Steak, lean (3 oz/85 g) / 4 g  Avoid: Hamburger (3 oz/85 g) / 7 g  Choose: Hamburger, lean (3 oz/85 g) / 5 g  Avoid: Ham (3 oz/85 g) / 6 g  Choose: Ham, lean cut (3 oz/85 g) / 2.4 g  Avoid: Chicken, with skin, dark meat (3 oz/85 g) / 4 g  Choose: Chicken, skin removed, dark meat (3 oz/85 g) / 2 g  Avoid: Chicken, with skin, light meat (3 oz/85 g) / 2.5 g  Choose: Chicken, skin removed, light meat (3 oz/85 g) / 1 g Dairy / Saturated Fat (g)  Avoid: Whole milk (1 cup) / 5 g  Choose: Low-fat milk, 2% (1 cup) / 3 g  Choose: Low-fat milk, 1% (1 cup) / 1.5 g  Choose: Skim milk (1 cup) / 0.3 g  Avoid: Hard cheese (1 oz/28 g) / 6 g  Choose: Skim milk cheese (1 oz/28 g) / 2 to 3 g  Avoid: Cottage cheese, 4% fat (1 cup) / 6.5 g  Choose: Low-fat cottage cheese, 1% fat (1 cup) /  1.5 g  Avoid: Ice cream (1 cup) / 9 g  Choose: Sherbet (1 cup) / 2.5 g  Choose: Nonfat frozen yogurt (1 cup) / 0.3 g  Choose: Frozen fruit bar / trace  Avoid: Whipped cream (1 tbs) / 3.5 g  Choose: Nondairy whipped topping (1 tbs) / 1 g Condiments / Saturated Fat (g)  Avoid: Mayonnaise (1 tbs) / 2 g  Choose: Low-fat mayonnaise (1 tbs) / 1 g  Avoid: Butter (1 tbs) / 7 g  Choose: Extra light margarine (1 tbs) / 1 g  Avoid: Coconut oil (1 tbs) / 11.8 g  Choose: Olive oil (1 tbs) / 1.8 g  Choose: Corn oil (1 tbs) / 1.7 g  Choose: Safflower oil (1 tbs) / 1.2 g  Choose: Sunflower oil (1 tbs) / 1.4 g  Choose: Soybean oil (1 tbs) / 2.4 g  Choose: Canola oil (1 tbs) / 1 g Document Released: 02/02/2005 Document Revised: 04/27/2011 Document Reviewed: 07/24/2010 [facility] Patient Information  2013 [place], [place].

## 2011-12-17 NOTE — Assessment & Plan Note (Signed)
Stable

## 2011-12-17 NOTE — Assessment & Plan Note (Signed)
Patient is due for a lipid profile. 

## 2011-12-17 NOTE — Assessment & Plan Note (Signed)
Patient continues to smoke a half pack of cigarettes daily. Had a long discussion with the patient concerning smoking cessation. At this point she is not willing to quit.

## 2011-12-17 NOTE — Assessment & Plan Note (Signed)
Patient is stable without chest pain. 

## 2011-12-18 ENCOUNTER — Other Ambulatory Visit: Payer: Self-pay | Admitting: Physician Assistant

## 2011-12-18 LAB — LIPID PANEL
Cholesterol: 140 mg/dL (ref 0–200)
LDL Cholesterol: 52 mg/dL (ref 0–99)
Total CHOL/HDL Ratio: 4.8 Ratio
VLDL: 59 mg/dL — ABNORMAL HIGH (ref 0–40)

## 2011-12-24 ENCOUNTER — Other Ambulatory Visit: Payer: Self-pay | Admitting: *Deleted

## 2011-12-24 MED ORDER — NIACIN ER 500 MG PO CPCR: 500.0000 mg | ORAL_CAPSULE | Freq: Every day | ORAL | Status: DC

## 2011-12-25 ENCOUNTER — Telehealth: Payer: Self-pay | Admitting: Cardiology

## 2011-12-25 MED ORDER — NIACIN ER 500 MG PO CPCR: 500.0000 mg | ORAL_CAPSULE | Freq: Every day | ORAL | Status: AC

## 2011-12-25 MED ORDER — NIACIN ER 250 MG PO CPCR: 250.0000 mg | ORAL_CAPSULE | Freq: Every day | ORAL | Status: DC

## 2011-12-25 NOTE — Telephone Encounter (Signed)
Sent refill for niacin 250 for a qty of 7 and 0 refills and a refill for niacin 500 for a qty of 30 and 6 refills. *No Call made.

## 2011-12-28 ENCOUNTER — Other Ambulatory Visit: Payer: Self-pay | Admitting: *Deleted

## 2011-12-31 ENCOUNTER — Other Ambulatory Visit: Payer: Self-pay | Admitting: Adult Health

## 2011-12-31 ENCOUNTER — Other Ambulatory Visit: Payer: Self-pay | Admitting: Cardiology

## 2011-12-31 MED ORDER — METOPROLOL TARTRATE 25 MG PO TABS: 25.0000 mg | ORAL_TABLET | Freq: Two times a day (BID) | ORAL | Status: DC

## 2012-01-28 ENCOUNTER — Encounter: Payer: Self-pay | Admitting: Vascular Surgery

## 2012-01-29 ENCOUNTER — Encounter: Payer: Managed Care, Other (non HMO) | Admitting: Vascular Surgery

## 2012-04-02 ENCOUNTER — Other Ambulatory Visit: Payer: Self-pay

## 2012-06-21 ENCOUNTER — Encounter: Payer: Self-pay | Admitting: Cardiology

## 2012-06-27 ENCOUNTER — Other Ambulatory Visit (HOSPITAL_COMMUNITY): Payer: Self-pay | Admitting: Family Medicine

## 2012-06-27 DIAGNOSIS — Z139 Encounter for screening, unspecified: Secondary | ICD-10-CM

## 2012-06-29 ENCOUNTER — Ambulatory Visit: Payer: Managed Care, Other (non HMO) | Admitting: Cardiology

## 2012-07-20 ENCOUNTER — Encounter: Payer: Self-pay | Admitting: Cardiology

## 2012-07-20 ENCOUNTER — Ambulatory Visit (INDEPENDENT_AMBULATORY_CARE_PROVIDER_SITE_OTHER): Payer: Managed Care, Other (non HMO) | Admitting: Cardiology

## 2012-07-20 VITALS — BP 119/68 | HR 56 | Ht 65.0 in | Wt 243.4 lb

## 2012-07-20 DIAGNOSIS — F4321 Adjustment disorder with depressed mood: Secondary | ICD-10-CM

## 2012-07-20 DIAGNOSIS — E8881 Metabolic syndrome: Secondary | ICD-10-CM | POA: Insufficient documentation

## 2012-07-20 DIAGNOSIS — I2510 Atherosclerotic heart disease of native coronary artery without angina pectoris: Secondary | ICD-10-CM

## 2012-07-20 DIAGNOSIS — Z720 Tobacco use: Secondary | ICD-10-CM

## 2012-07-20 DIAGNOSIS — I7090 Unspecified atherosclerosis: Secondary | ICD-10-CM

## 2012-07-20 DIAGNOSIS — F329 Major depressive disorder, single episode, unspecified: Secondary | ICD-10-CM

## 2012-07-20 DIAGNOSIS — I10 Essential (primary) hypertension: Secondary | ICD-10-CM

## 2012-07-20 DIAGNOSIS — F17200 Nicotine dependence, unspecified, uncomplicated: Secondary | ICD-10-CM

## 2012-07-20 DIAGNOSIS — E785 Hyperlipidemia, unspecified: Secondary | ICD-10-CM

## 2012-07-20 DIAGNOSIS — E119 Type 2 diabetes mellitus without complications: Secondary | ICD-10-CM

## 2012-07-20 MED ORDER — SIMVASTATIN 20 MG PO TABS: ORAL_TABLET | ORAL | Status: DC

## 2012-07-20 MED ORDER — METOPROLOL TARTRATE 25 MG PO TABS: 25.0000 mg | ORAL_TABLET | Freq: Two times a day (BID) | ORAL | Status: AC

## 2012-07-20 MED ORDER — NITROGLYCERIN 0.4 MG SL SUBL: 0.4000 mg | SUBLINGUAL_TABLET | SUBLINGUAL | Status: AC | PRN

## 2012-07-20 NOTE — Assessment & Plan Note (Signed)
No significant blood pressure elevations documented within the past 2 years. Current therapy appears effective and will be continued.

## 2012-07-20 NOTE — Assessment & Plan Note (Signed)
Hyperlipidemia previously well-controlled with low-dose statin therapy. Repeat profile will be obtained following resumption of that treatment.

## 2012-07-20 NOTE — Patient Instructions (Addendum)
LABS:  Fasting Lipid Panel in 1 month  STOP TAKING PLavix.  STOP Smoking  Your physician wants you to follow-up in: 1 year.  You will receive a reminder letter in the mail two months in advance. If you don't receive a letter, please call our office to schedule the follow-up appointment.

## 2012-07-20 NOTE — Assessment & Plan Note (Addendum)
Patient is doing well following acute MI. EKG suggests degree of infarction was minor as does EF of 45% during urgent intervention.  Clopidogrel will be discontinued. We will persist in our efforts to optimally control cardiovascular risk factors.

## 2012-07-20 NOTE — Progress Notes (Signed)
Patient ID: Christy [name], female   DOB: Sep 15, 1960, 52 y.o.   MRN: [id number]  HPI: Scheduled return visit for continued assessment and treatment of coronary artery disease and multiple cardiovascular risk factors.  Since suffering a myocardial infarction nearly 2 years ago, which required placement of 2 DES in the LAD, patient has done well. She denies significant chest discomfort. She has maintained her usual level of activity without difficulty.  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 Her primary care physician recently restarted simvastatin, which patient claims that we directed her to discontinue. The chart indicates that we recommended continuation of simvastatin and addition of niacin.  Current Outpatient Prescriptions  Medication Sig Dispense Refill  . aspirin 81 MG tablet Take 81 mg by mouth daily.        . fish oil-omega-3 fatty acids 1000 MG capsule Take 3 g by mouth daily.       Marland Kitchen glipiZIDE (GLUCOTROL) 5 MG tablet Take 5 mg by mouth daily.      . metoprolol tartrate (LOPRESSOR) 25 MG tablet Take 1 tablet (25 mg total) by mouth 2 (two) times daily.  180 tablet  3  . niacin 500 MG CR capsule Take 1 capsule (500 mg total) by mouth at bedtime.  30 capsule  6  . nitroGLYCERIN (NITROSTAT) 0.4 MG SL tablet Place 1 tablet (0.4 mg total) under the tongue every 5 (five) minutes as needed.  25 tablet  5  . simvastatin (ZOCOR) 20 MG tablet TAKE ONE TABLET BY MOUTH EVERY DAY AT BEDTIME  90 tablet  3   No current facility-administered medications for this visit.   Allergies  Allergen Reactions  . Bactrim Diarrhea  . Isosorbide Mononitrate Other (See Comments)    Made patient feel as though she was about to have another MI     Past medical history, social history, and family history reviewed and updated.  ROS: Denies chest pain, dyspnea, orthopnea, pedal  edema, palpitations, lightheadedness or syncope. All other systems reviewed and are negative.  PHYSICAL EXAM: BP 119/68  Pulse 56  Ht 5\' 5"  (1.651 m)  Wt 110.406 kg (243 lb 6.4 oz)  BMI 40.5 kg/m2;  Body mass index is 40.5 kg/(m^2). General-Well developed; no acute distress Body habitus-moderately to markedly overweight Neck-No JVD; no carotid bruits Lungs-clear lung fields; resonant to percussion; I:E ratio not markedly abnormal Cardiovascular-normal PMI; normal S1 and S2 Abdomen-normal bowel sounds; soft and non-tender without masses or organomegaly Musculoskeletal-No deformities, no cyanosis or clubbing Neurologic-Normal cranial nerves; symmetric strength and tone Skin-Warm, no significant lesions Extremities-distal pulses intact; no edema  EKG: Sinus bradycardia at a rate of 56 bpm; borderline first degree AV block; leftward axis; slightly delayed R-wave progression; otherwise unremarkable. No previous tracing for comparison.  [place] Bing, MD 07/20/2012  3:28 PM  ASSESSMENT AND PLAN

## 2012-07-20 NOTE — Assessment & Plan Note (Addendum)
Patient reports that she fears pharmacologic therapy except for nicotine replacement, which was not effective in the past. She has never even attempted a period of abstinence and is encouraged to do so.

## 2012-07-22 NOTE — Addendum Note (Signed)
Addended by: Derry Lory A on: 07/22/2012 03:06 PM   Modules accepted: Orders

## 2012-08-03 ENCOUNTER — Encounter: Payer: Self-pay | Admitting: Cardiology

## 2012-08-16 ENCOUNTER — Ambulatory Visit (HOSPITAL_COMMUNITY)
Admission: RE | Admit: 2012-08-16 | Discharge: 2012-08-16 | Disposition: A | Discharge status: Home / Self Care | Payer: Managed Care, Other (non HMO) | Source: Ambulatory Visit | Attending: Family Medicine | Admitting: Family Medicine

## 2012-08-16 DIAGNOSIS — Z139 Encounter for screening, unspecified: Secondary | ICD-10-CM

## 2012-08-16 DIAGNOSIS — Z1231 Encounter for screening mammogram for malignant neoplasm of breast: Secondary | ICD-10-CM | POA: Insufficient documentation

## 2012-08-17 LAB — LIPID PANEL
LDL Cholesterol: 58 mg/dL (ref 0–99)
Triglycerides: 252 mg/dL — ABNORMAL HIGH (ref <150)

## 2012-08-19 ENCOUNTER — Encounter: Payer: Self-pay | Admitting: Cardiology

## 2012-08-22 ENCOUNTER — Encounter: Payer: Self-pay | Admitting: *Deleted

## 2012-08-23 ENCOUNTER — Other Ambulatory Visit: Payer: Self-pay | Admitting: Family Medicine

## 2012-08-23 DIAGNOSIS — R928 Other abnormal and inconclusive findings on diagnostic imaging of breast: Secondary | ICD-10-CM

## 2012-10-14 ENCOUNTER — Encounter (HOSPITAL_COMMUNITY): Payer: Self-pay | Admitting: *Deleted

## 2012-10-14 ENCOUNTER — Emergency Department (HOSPITAL_COMMUNITY): Payer: Managed Care, Other (non HMO)

## 2012-10-14 ENCOUNTER — Emergency Department (HOSPITAL_COMMUNITY)
Admission: EM | Admit: 2012-10-14 | Discharge: 2012-10-14 | Disposition: A | Discharge status: Home / Self Care | Payer: Managed Care, Other (non HMO) | Attending: Emergency Medicine | Admitting: Emergency Medicine

## 2012-10-14 DIAGNOSIS — Z9861 Coronary angioplasty status: Secondary | ICD-10-CM | POA: Insufficient documentation

## 2012-10-14 DIAGNOSIS — L97519 Non-pressure chronic ulcer of other part of right foot with unspecified severity: Secondary | ICD-10-CM

## 2012-10-14 DIAGNOSIS — Z862 Personal history of diseases of the blood and blood-forming organs and certain disorders involving the immune mechanism: Secondary | ICD-10-CM | POA: Insufficient documentation

## 2012-10-14 DIAGNOSIS — Z8679 Personal history of other diseases of the circulatory system: Secondary | ICD-10-CM | POA: Insufficient documentation

## 2012-10-14 DIAGNOSIS — Z8659 Personal history of other mental and behavioral disorders: Secondary | ICD-10-CM | POA: Insufficient documentation

## 2012-10-14 DIAGNOSIS — Z7982 Long term (current) use of aspirin: Secondary | ICD-10-CM | POA: Insufficient documentation

## 2012-10-14 DIAGNOSIS — F17200 Nicotine dependence, unspecified, uncomplicated: Secondary | ICD-10-CM | POA: Insufficient documentation

## 2012-10-14 DIAGNOSIS — Z8639 Personal history of other endocrine, nutritional and metabolic disease: Secondary | ICD-10-CM | POA: Insufficient documentation

## 2012-10-14 DIAGNOSIS — I10 Essential (primary) hypertension: Secondary | ICD-10-CM | POA: Insufficient documentation

## 2012-10-14 DIAGNOSIS — E119 Type 2 diabetes mellitus without complications: Secondary | ICD-10-CM | POA: Insufficient documentation

## 2012-10-14 DIAGNOSIS — L97509 Non-pressure chronic ulcer of other part of unspecified foot with unspecified severity: Secondary | ICD-10-CM | POA: Insufficient documentation

## 2012-10-14 DIAGNOSIS — Z79899 Other long term (current) drug therapy: Secondary | ICD-10-CM | POA: Insufficient documentation

## 2012-10-14 DIAGNOSIS — R209 Unspecified disturbances of skin sensation: Secondary | ICD-10-CM | POA: Insufficient documentation

## 2012-10-14 MED ORDER — CLINDAMYCIN HCL 150 MG PO CAPS: 300.0000 mg | ORAL_CAPSULE | Freq: Three times a day (TID) | ORAL | Status: DC

## 2012-10-14 MED ORDER — CLINDAMYCIN HCL 150 MG PO CAPS
300.0000 mg | ORAL_CAPSULE | Freq: Once | ORAL | Status: AC
  Administered 2012-10-14: 300 mg via ORAL
  Filled 2012-10-14: qty 2

## 2012-10-14 NOTE — ED Provider Notes (Signed)
Scribed for Vida Roller, MD, the patient was seen in room APA11/APA11. This chart was scribed by Lewanda Rife, ED scribe. Patient's care was started at 1733  CSN: [id number]     Arrival date & time 10/14/12  1559 History   First MD Initiated Contact with Patient 10/14/12 1715     Chief Complaint  Patient presents with  . Wound Infection   (Consider location/radiation/quality/duration/timing/severity/associated sxs/prior Treatment) The history is provided by the patient.   HPI Comments: Christy [name] is a 52 y.o. female who presents to the Emergency Department complaining of with a hx of diabetes mellitus complaining of an unchanged (for past 6 days) mildly draining wound on lateral plantar aspect right foot onset 11 days. Reports associated numbness, unchanged moderate sharp pain, and redness to dorsum of distal right foot (onset today). Reports pain is exacerbated by touch, prescribed Mupirocin, and weight bearing and alleviated by nothing. Denies associated fever. Reports she was evaluated by PCP 6 days ago and was prescribed Mupirocin antibacterial ointment and doxycyline with no relief of symptoms.  No redness on the dorsum of the foot has only started in the last 2 days, the ulcer has not changed in the last 6 days. There is a small amount of foul-smelling discharge from the wound according to the patient. 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   Past Medical History  Diagnosis Date  . 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  . Tobacco abuse     60-90-pack-year history; 1/3 pack per day in 07-24-11  . Hypertension   . Diabetes mellitus, type 2   . Depression 24-Jul-2010    Following sudden death of husband  . Metabolic syndrome     Elevated triglycerides, low HDL, fasting hyperglycemia with low-dose sulfonylurea   Past Surgical History  Procedure Laterality Date  .  Coronary angioplasty with stent placement  10/01/10    DES x2  . Cholecystectomy    . Bilateral oophorectomy  July 23, 2009    for benign mass   Family History  Problem Relation Age of Onset  . Heart disease Father   . Heart disease Mother    History  Substance Use Topics  . Smoking status: Current Every Day Smoker -- 3.00 packs/day for 30 years    Types: Cigarettes  . Smokeless tobacco: Not on file  . Alcohol Use: No   OB History   Grav Para Term Preterm Abortions TAB SAB Ect Mult Living                 Review of Systems  Constitutional: Negative for fever.  Skin: Positive for wound.  Psychiatric/Behavioral: Negative for confusion.    Allergies  Bactrim and Isosorbide mononitrate  Home Medications   Current Outpatient Rx  Name  Route  Sig  Dispense  Refill  . aspirin 81 MG tablet   Oral   Take 81 mg by mouth daily.           Marland Kitchen doxycycline (VIBRA-TABS) 100 MG tablet   Oral   Take 100 mg by mouth 2 (two) times daily.         . fish oil-omega-3 fatty acids 1000 MG capsule   Oral   Take 3 g by mouth daily.          Marland Kitchen glipiZIDE (GLUCOTROL) 5 MG tablet   Oral  Take 5 mg by mouth daily.         . metoprolol tartrate (LOPRESSOR) 25 MG tablet   Oral   Take 1 tablet (25 mg total) by mouth 2 (two) times daily.   180 tablet   3   . mupirocin ointment (BACTROBAN) 2 %   Topical   Apply 1 application topically 2 (two) times daily. Applied to right foot         . niacin 500 MG CR capsule   Oral   Take 1 capsule (500 mg total) by mouth at bedtime.   30 capsule   6   . simvastatin (ZOCOR) 20 MG tablet   Oral   Take 20 mg by mouth at bedtime.         . clindamycin (CLEOCIN) 150 MG capsule   Oral   Take 2 capsules (300 mg total) by mouth 3 (three) times daily. May dispense as 150mg  capsules   60 capsule   0   . nitroGLYCERIN (NITROSTAT) 0.4 MG SL tablet   Sublingual   Place 1 tablet (0.4 mg total) under the tongue every 5 (five) minutes as needed.   25  tablet   5    BP 141/61  Pulse 62  Temp(Src) 98.7 F (37.1 C) (Oral)  Resp 20  Ht 5\' 5"  (1.651 m)  Wt 243 lb (110.224 kg)  BMI 40.44 kg/m2  SpO2 96% Physical Exam  Nursing note and vitals reviewed. Constitutional: She is oriented to person, place, and time. She appears well-developed and well-nourished. No distress.  HENT:  Head: Normocephalic and atraumatic.  Eyes: EOM are normal. No scleral icterus.  Neck: Neck supple. No tracheal deviation present.  Cardiovascular: Normal rate, regular rhythm and normal heart sounds.   Pulmonary/Chest: Effort normal and breath sounds normal. No respiratory distress. She has no wheezes.  Musculoskeletal: Normal range of motion. She exhibits no edema.       Right foot: She exhibits tenderness.  TTP of lateral plantar aspect of right foot localized to wound site   Neurological: She is alert and oriented to person, place, and time. No sensory deficit.  Mildly decreased sensation to bilateral plantar aspects of feet, but worse on the right lateral plantar aspect localized to site of wound    Skin: Skin is warm and dry. There is erythema.  1 cm in diameter ulceration with clear serous discharge and mildly foul odor on lateral plantar aspect of right foot. Erythema spread to the distal dorsum of the right foot.    Psychiatric: She has a normal mood and affect. Her behavior is normal.    ED Course  Procedures (including critical care time) Medications  clindamycin (CLEOCIN) capsule 300 mg (300 mg Oral Given 10/14/12 1752)    Labs Review Labs Reviewed - No data to display Imaging Review Dg Foot Complete Right  10/14/2012   *RADIOLOGY REPORT*  Clinical Data: Osteomyelitis.  Ulceration of the fifth toe.  RIGHT FOOT COMPLETE - 3+ VIEW  Comparison: None.  Findings: Soft tissue swelling is present in the lateral aspect of the right forefoot.  There is no osteolysis to suggest osteomyelitis.  Gas is present within the soft tissues lateral to the proximal  phalanx of the fifth toe, consistent with adjacent ulceration and which is not visualized radiographically.  IMPRESSION: No osteomyelitis or acute osseous abnormality.  Soft tissue swelling over the small toe with gas in the soft tissues, likely secondary to ulceration.   Original Report Authenticated By: Andreas Newport,  M.D.    MDM   1. Foot ulcer, right    The x-ray show no signs of foreign body, no signs of osteomyelitis, the patient has no fever or tachycardia. I will change her antibiotic to clindamycin and I recommended strict followup with a podiatrist. I will also recommend changing to bacitracin from the mupirocen - she has expressed her  understanding.  Meds given in ED:  Medications  clindamycin (CLEOCIN) capsule 300 mg (300 mg Oral Given 10/14/12 1752)    New Prescriptions   CLINDAMYCIN (CLEOCIN) 150 MG CAPSULE    Take 2 capsules (300 mg total) by mouth 3 (three) times daily. May dispense as 150mg  capsules      I personally performed the services described in this documentation, which was scribed in my presence. The recorded information has been reviewed and is accurate.       Vida Roller, MD 10/14/12 [phone]

## 2012-10-14 NOTE — ED Notes (Signed)
History of diabetes, sore on right foot, bandaged on arrival

## 2012-11-01 ENCOUNTER — Other Ambulatory Visit (HOSPITAL_COMMUNITY): Payer: Self-pay | Admitting: Podiatry

## 2012-11-01 ENCOUNTER — Other Ambulatory Visit (HOSPITAL_COMMUNITY): Payer: Managed Care, Other (non HMO)

## 2012-11-01 DIAGNOSIS — M869 Osteomyelitis, unspecified: Secondary | ICD-10-CM

## 2012-11-02 ENCOUNTER — Encounter (HOSPITAL_COMMUNITY): Payer: Self-pay

## 2012-11-02 ENCOUNTER — Other Ambulatory Visit: Payer: Self-pay | Admitting: Podiatry

## 2012-11-02 ENCOUNTER — Encounter (HOSPITAL_COMMUNITY)
Admission: RE | Admit: 2012-11-02 | Discharge: 2012-11-02 | Disposition: A | Discharge status: Home / Self Care | Payer: Managed Care, Other (non HMO) | Source: Ambulatory Visit | Attending: Podiatry | Admitting: Podiatry

## 2012-11-02 ENCOUNTER — Encounter (HOSPITAL_COMMUNITY): Payer: Managed Care, Other (non HMO)

## 2012-11-02 HISTORY — DX: Other specified postprocedural states: Z98.890

## 2012-11-02 HISTORY — DX: Nausea with vomiting, unspecified: R11.2

## 2012-11-02 HISTORY — DX: Acute myocardial infarction, unspecified: I21.9

## 2012-11-02 LAB — BASIC METABOLIC PANEL
BUN: 13 mg/dL (ref 6–23)
CO2: 24 mEq/L (ref 19–32)
Chloride: 102 mEq/L (ref 96–112)
Creatinine, Ser: 1.08 mg/dL (ref 0.50–1.10)
GFR calc Af Amer: 67 mL/min — ABNORMAL LOW (ref >90)
Glucose, Bld: 101 mg/dL — ABNORMAL HIGH (ref 70–99)
Potassium: 4 mEq/L (ref 3.5–5.1)

## 2012-11-02 NOTE — Patient Instructions (Addendum)
Christy [name]  11/02/2012   Your procedure is scheduled on:  Thursday, 11/03/12  Report to [facility] at 0850 AM.  Call this number if you have problems the morning of surgery: [phone]   Remember:   Do not eat food or drink liquids after midnight.   Take these medicines the morning of surgery with A SIP OF WATER: metoprolol. Please Do NOT take your medication for Diabetes.   Do not wear jewelry, make-up or nail polish.  Do not wear lotions, powders, or perfumes. You may wear deodorant.  Do not shave 48 hours prior to surgery. Men may shave face and neck.  Do not bring valuables to the hospital.  [facility] is not responsible for any belongings or valuables.  Contacts, dentures or bridgework may not be worn into surgery.  Leave suitcase in the car. After surgery it may be brought to your room.    Patients discharged the day of surgery will not be allowed to drive home.  Name and phone number of your driver: family  Special Instructions: Shower using CHG 2 nights before surgery and the night before surgery.  If you shower the day of surgery use CHG.  Use special wash - you have one bottle of CHG for all showers.  You should use approximately 1/3 of the bottle for each shower.   Please read over the following fact sheets that you were given: Anesthesia Post-op Instructions and Care and Recovery After Surgery Incision Care PATIENT INSTRUCTIONS POST-ANESTHESIA  IMMEDIATELY FOLLOWING SURGERY:  Do not drive or operate machinery for the first twenty four hours after surgery.  Do not make any important decisions for twenty four hours after surgery or while taking narcotic pain medications or sedatives.  If you develop intractable nausea and vomiting or a severe headache please notify your doctor immediately.  FOLLOW-UP:  Please make an appointment with your surgeon as instructed. You do not need to follow up with anesthesia unless specifically instructed to do so.  WOUND CARE INSTRUCTIONS (if  applicable):  Keep a dry clean dressing on the anesthesia/puncture wound site if there is drainage.  Once the wound has quit draining you may leave it open to air.  Generally you should leave the bandage intact for twenty four hours unless there is drainage.  If the epidural site drains for more than 36-48 hours please call the anesthesia department.  QUESTIONS?:  Please feel free to call your physician or the hospital operator if you have any questions, and they will be happy to assist you.       An incision is a surgical cut to open your skin. You need to take care of your incision. This helps you to not get an infection. HOME CARE  Only take medicine as told by your doctor.  Do not take off your bandage (dressing) or get your incision wet until your doctor approves. Change the bandage and call your doctor if the bandage gets wet, dirty, or starts to smell.  Take showers. Do not take baths, swim, or do anything that may soak your incision until it heals.  Return to your normal diet and activities as told or allowed by your doctor.  Avoid lifting any weight until your doctor approves.  Put medicine that helps lessen itching on your incision as told by your doctor. Do not pick or scratch at your incision.  Keep your doctor visit to have your stitches (sutures) or staples removed.  Drink enough fluids to keep your  pee (urine) clear or pale yellow. GET HELP RIGHT AWAY IF:  You have a fever.  You have a rash.  You are dizzy, or you pass out (faint) while standing.  You have trouble breathing.  You have a reaction or side effects to medicine given to you.  You have redness, puffiness (swelling), or more pain in the incision and medicine does not help.  You have fluid, blood, or yellowish-white fluid (pus) coming from the incision lasting over 1 day.  You have muscle aches, chills, or you feel sick.  You have a bad smell coming from the incision or bandage.  Your incision opens  up after stitches, staples, or sticky strips have been removed.  You keep feeling sick to your stomach (nauseous) or keep throwing up (vomiting). MAKE SURE YOU:   Understand these instructions.  Will watch your condition.  Will get help right away if you are not doing well or get worse. Document Released: 04/27/2011 Document Reviewed: 04/27/2011 [facility] Patient Information 2014 [place], [place].  Toe Injuries and Amputations You have cut off (amputated) part of your toe. Your outcome depends largely on how much was amputated. If just the tip is amputated, often the end of the toe will grow back and the toe may return much to the same as it was before the injury. If more of the toe is missing, your caregiver has done the best with the tissue remaining to allow you to keep as much toe as is possible or has finished the amputation at a level that will leave you with the most functional toe. This means a toe that will work the best for you. Please read the instructions outlined below and refer to this sheet in the next few weeks. These instructions provide you with general information on caring for yourself. Your caregiver may also give you specific instructions. While your treatment has been done according to the most current medical practices available, unavoidable complications occasionally occur. If you have any problems or questions after discharge, call your caregiver. HOME CARE INSTRUCTIONS  You may resume a normal diet and activities as directed or allowed. Keep your foot elevated when possible. This helps decrease pain and swelling. Keep ice packs (a bag of ice wrapped in a towel) on the injured area for 15-20 minutes, 3-4 times per day, for the first two days. Use ice only if OK with your caregiver. Change dressings if necessary or as directed. Clean the wounded area as directed. Only take over-the-counter or prescription medicines for pain, discomfort, or fever as directed by your  caregiver. Keep appointments as directed. SEEK IMMEDIATE MEDICAL CARE IF: There is redness, swelling, numbness or increasing pain in the wound. There is pus coming from wound. You have an unexplained oral temperature above 102 F (38.9 C) or as your caregiver suggests. There is a bad (foul) smell coming from the wound or dressing. The edges of the wound break open (the edges are not staying together) after sutures or staples have been removed. Document Released: 12/24/2004 Document Revised: 04/27/2011 Document Reviewed: 05/23/2008 [facility] Patient Information 2014 [place], [place].

## 2012-11-03 ENCOUNTER — Ambulatory Visit (HOSPITAL_COMMUNITY)
Admission: RE | Admit: 2012-11-03 | Discharge: 2012-11-03 | Disposition: A | Discharge status: Home / Self Care | Payer: Managed Care, Other (non HMO) | Source: Ambulatory Visit | Attending: Podiatry | Admitting: Podiatry

## 2012-11-03 ENCOUNTER — Encounter (HOSPITAL_COMMUNITY): Payer: Self-pay | Admitting: *Deleted

## 2012-11-03 ENCOUNTER — Encounter (HOSPITAL_COMMUNITY): Payer: Self-pay | Admitting: Anesthesiology

## 2012-11-03 ENCOUNTER — Encounter (HOSPITAL_COMMUNITY)
Admission: RE | Disposition: A | Discharge status: Home / Self Care | Payer: Self-pay | Source: Ambulatory Visit | Attending: Podiatry

## 2012-11-03 ENCOUNTER — Ambulatory Visit (HOSPITAL_COMMUNITY): Payer: Managed Care, Other (non HMO)

## 2012-11-03 ENCOUNTER — Ambulatory Visit (HOSPITAL_COMMUNITY): Payer: Managed Care, Other (non HMO) | Admitting: Anesthesiology

## 2012-11-03 DIAGNOSIS — E119 Type 2 diabetes mellitus without complications: Secondary | ICD-10-CM | POA: Insufficient documentation

## 2012-11-03 DIAGNOSIS — M869 Osteomyelitis, unspecified: Secondary | ICD-10-CM | POA: Insufficient documentation

## 2012-11-03 DIAGNOSIS — Z01812 Encounter for preprocedural laboratory examination: Secondary | ICD-10-CM | POA: Insufficient documentation

## 2012-11-03 DIAGNOSIS — I10 Essential (primary) hypertension: Secondary | ICD-10-CM | POA: Insufficient documentation

## 2012-11-03 DIAGNOSIS — E1169 Type 2 diabetes mellitus with other specified complication: Secondary | ICD-10-CM

## 2012-11-03 HISTORY — PX: METATARSAL HEAD EXCISION: SHX5027

## 2012-11-03 LAB — GLUCOSE, CAPILLARY: Glucose-Capillary: 120 mg/dL — ABNORMAL HIGH (ref 70–99)

## 2012-11-03 SURGERY — EXCISION, METATARSAL BONE, HEAD
Anesthesia: Monitor Anesthesia Care | Laterality: Right | Wound class: Dirty or Infected

## 2012-11-03 MED ORDER — BUPIVACAINE HCL (PF) 0.5 % IJ SOLN
INTRAMUSCULAR | Status: DC | PRN
  Administered 2012-11-03: 20 mL

## 2012-11-03 MED ORDER — PROPOFOL 10 MG/ML IV EMUL
INTRAVENOUS | Status: AC
  Filled 2012-11-03: qty 20

## 2012-11-03 MED ORDER — FENTANYL CITRATE 0.05 MG/ML IJ SOLN
INTRAMUSCULAR | Status: DC | PRN
  Administered 2012-11-03: 50 ug via INTRAVENOUS
  Administered 2012-11-03 (×2): 25 ug via INTRAVENOUS

## 2012-11-03 MED ORDER — EPHEDRINE SULFATE 50 MG/ML IJ SOLN
INTRAMUSCULAR | Status: DC | PRN
  Administered 2012-11-03: 10 mg via INTRAVENOUS

## 2012-11-03 MED ORDER — BUPIVACAINE HCL (PF) 0.5 % IJ SOLN
INTRAMUSCULAR | Status: AC
  Filled 2012-11-03: qty 30

## 2012-11-03 MED ORDER — MIDAZOLAM HCL 2 MG/2ML IJ SOLN
INTRAMUSCULAR | Status: AC
  Filled 2012-11-03: qty 2

## 2012-11-03 MED ORDER — LIDOCAINE HCL (PF) 1 % IJ SOLN
INTRAMUSCULAR | Status: AC
  Filled 2012-11-03: qty 30

## 2012-11-03 MED ORDER — LACTATED RINGERS IV SOLN
INTRAVENOUS | Status: DC
  Administered 2012-11-03: 10:00:00 via INTRAVENOUS

## 2012-11-03 MED ORDER — LACTATED RINGERS IV SOLN
INTRAVENOUS | Status: DC | PRN
  Administered 2012-11-03 (×2): via INTRAVENOUS

## 2012-11-03 MED ORDER — CEFAZOLIN SODIUM-DEXTROSE 2-3 GM-% IV SOLR
INTRAVENOUS | Status: AC
  Filled 2012-11-03: qty 50

## 2012-11-03 MED ORDER — LIDOCAINE HCL (PF) 1 % IJ SOLN
INTRAMUSCULAR | Status: AC
  Filled 2012-11-03: qty 5

## 2012-11-03 MED ORDER — ONDANSETRON HCL 4 MG/2ML IJ SOLN
4.0000 mg | Freq: Once | INTRAMUSCULAR | Status: AC
  Administered 2012-11-03: 4 mg via INTRAVENOUS

## 2012-11-03 MED ORDER — FENTANYL CITRATE 0.05 MG/ML IJ SOLN
25.0000 ug | INTRAMUSCULAR | Status: AC
  Administered 2012-11-03 (×2): 25 ug via INTRAVENOUS

## 2012-11-03 MED ORDER — FENTANYL CITRATE 0.05 MG/ML IJ SOLN
INTRAMUSCULAR | Status: AC
  Filled 2012-11-03: qty 2

## 2012-11-03 MED ORDER — ONDANSETRON HCL 4 MG/2ML IJ SOLN
INTRAMUSCULAR | Status: AC
  Filled 2012-11-03: qty 2

## 2012-11-03 MED ORDER — 0.9 % SODIUM CHLORIDE (POUR BTL) OPTIME
TOPICAL | Status: DC | PRN
  Administered 2012-11-03: 1000 mL

## 2012-11-03 MED ORDER — MIDAZOLAM HCL 2 MG/2ML IJ SOLN
1.0000 mg | INTRAMUSCULAR | Status: AC | PRN
  Administered 2012-11-03 (×3): 2 mg via INTRAVENOUS

## 2012-11-03 MED ORDER — PROPOFOL INFUSION 10 MG/ML OPTIME
INTRAVENOUS | Status: DC | PRN
  Administered 2012-11-03: 75 ug/kg/min via INTRAVENOUS
  Administered 2012-11-03: 60 ug/kg/min via INTRAVENOUS

## 2012-11-03 MED ORDER — CEFAZOLIN SODIUM-DEXTROSE 2-3 GM-% IV SOLR
2.0000 g | Freq: Once | INTRAVENOUS | Status: AC
  Administered 2012-11-03: 2 g via INTRAVENOUS

## 2012-11-03 MED ORDER — DEXAMETHASONE SODIUM PHOSPHATE 4 MG/ML IJ SOLN
4.0000 mg | Freq: Once | INTRAMUSCULAR | Status: AC
  Administered 2012-11-03: 4 mg via INTRAVENOUS

## 2012-11-03 MED ORDER — DEXAMETHASONE SODIUM PHOSPHATE 4 MG/ML IJ SOLN
INTRAMUSCULAR | Status: AC
  Filled 2012-11-03: qty 1

## 2012-11-03 MED ORDER — EPHEDRINE SULFATE 50 MG/ML IJ SOLN
INTRAMUSCULAR | Status: AC
  Filled 2012-11-03: qty 1

## 2012-11-03 SURGICAL SUPPLY — 42 items
APL SKNCLS STERI-STRIP NONHPOA (GAUZE/BANDAGES/DRESSINGS) ×1
BAG HAMPER (MISCELLANEOUS) ×2 IMPLANT
BANDAGE CONFORM 2  STR LF (GAUZE/BANDAGES/DRESSINGS) ×1 IMPLANT
BANDAGE ELASTIC 4 VELCRO NS (GAUZE/BANDAGES/DRESSINGS) ×2 IMPLANT
BANDAGE ESMARK 4X12 BL STRL LF (DISPOSABLE) ×1 IMPLANT
BANDAGE GAUZE ELAST BULKY 4 IN (GAUZE/BANDAGES/DRESSINGS) ×2 IMPLANT
BENZOIN TINCTURE PRP APPL 2/3 (GAUZE/BANDAGES/DRESSINGS) ×2 IMPLANT
BLADE AVERAGE 25X9 (BLADE) ×2 IMPLANT
BLADE SURG 15 STRL LF DISP TIS (BLADE) ×1 IMPLANT
BLADE SURG 15 STRL SS (BLADE) ×2
BNDG CMPR 12X4 ELC STRL LF (DISPOSABLE) ×1
BNDG ESMARK 4X12 BLUE STRL LF (DISPOSABLE) ×2
CHLORAPREP W/TINT 26ML (MISCELLANEOUS) ×2 IMPLANT
CLOTH BEACON ORANGE TIMEOUT ST (SAFETY) ×2 IMPLANT
COVER LIGHT HANDLE STERIS (MISCELLANEOUS) ×4 IMPLANT
CUFF TOURNIQUET SINGLE 18IN (TOURNIQUET CUFF) ×1 IMPLANT
DECANTER SPIKE VIAL GLASS SM (MISCELLANEOUS) ×2 IMPLANT
DRSG ADAPTIC 3X8 NADH LF (GAUZE/BANDAGES/DRESSINGS) ×2 IMPLANT
ELECT REM PT RETURN 9FT ADLT (ELECTROSURGICAL) ×2
ELECTRODE REM PT RTRN 9FT ADLT (ELECTROSURGICAL) ×1 IMPLANT
GLOVE BIO SURGEON STRL SZ7.5 (GLOVE) ×2 IMPLANT
GLOVE BIOGEL PI IND STRL 7.0 (GLOVE) IMPLANT
GLOVE BIOGEL PI INDICATOR 7.0 (GLOVE) ×3
GLOVE ECLIPSE 6.5 STRL STRAW (GLOVE) ×1 IMPLANT
GLOVE ECLIPSE 7.0 STRL STRAW (GLOVE) ×1 IMPLANT
GLOVE EXAM NITRILE MD LF STRL (GLOVE) ×1 IMPLANT
GLOVE SS BIOGEL STRL SZ 6.5 (GLOVE) IMPLANT
GLOVE SUPERSENSE BIOGEL SZ 6.5 (GLOVE) ×1
GOWN STRL REIN XL XLG (GOWN DISPOSABLE) ×5 IMPLANT
KIT ROOM TURNOVER APOR (KITS) ×2 IMPLANT
MANIFOLD NEPTUNE II (INSTRUMENTS) ×2 IMPLANT
MARKER SKIN DUAL TIP RULER LAB (MISCELLANEOUS) ×2 IMPLANT
NDL HYPO 27GX1-1/4 (NEEDLE) ×4 IMPLANT
NEEDLE HYPO 27GX1-1/4 (NEEDLE) ×4 IMPLANT
NS IRRIG 1000ML POUR BTL (IV SOLUTION) ×2 IMPLANT
PACK BASIC LIMB (CUSTOM PROCEDURE TRAY) ×2 IMPLANT
PAD ARMBOARD 7.5X6 YLW CONV (MISCELLANEOUS) ×2 IMPLANT
SET BASIN LINEN APH (SET/KITS/TRAYS/PACK) ×2 IMPLANT
SPONGE GAUZE 4X4 12PLY (GAUZE/BANDAGES/DRESSINGS) ×2 IMPLANT
STRIP CLOSURE SKIN 1/2X4 (GAUZE/BANDAGES/DRESSINGS) ×3 IMPLANT
SUT PROLENE 4 0 PS 2 18 (SUTURE) ×2 IMPLANT
SYR CONTROL 10ML LL (SYRINGE) ×4 IMPLANT

## 2012-11-03 NOTE — Op Note (Signed)
OPERATIVE NOTE  DATE OF PROCEDURE:  11/03/2012  SURGEON:   Dallas Schimke, DPM  OR STAFF:   Circulator: Lennox Pippins, RN; Cyndie Chime, RN Relief Scrub: Hurshel Party, CST Scrub Person: Diana Eves, CST RN First Assistant: Lizabeth Leyden, RN   PREOPERATIVE DIAGNOSIS:   Osteomyelitis fifth metatarsal and fifth digit, right foot.  POSTOPERATIVE DIAGNOSIS: Same  PROCEDURE: Partial 5th ray amputation, right foot.  ANESTHESIA:  Monitor Anesthesia Care   HEMOSTASIS:   None  ESTIMATED BLOOD LOSS:   10 cc  MATERIALS USED:  None  INJECTABLES: Marcaine 0.5% plain; 20mL  PATHOLOGY:   1.  5th digit and portion of 5th metatarsal for aerobic and aerobic culture and sensitivity and pathology. 2.  5th metatarsal for margins.  COMPLICATIONS:   None  INDICATIONS:  Chronic ulceration with radiographic and MRI findings consistent with osteomyelitis of the fifth digit and distal fifth metatarsal bone.  DESCRIPTION OF THE PROCEDURE:   The patient was brought to the operating room and placed on the operative table in the supine position.  A pneumatic ankle tourniquet was applied to the patient's ankle.  Following sedation, the surgical site was anesthetized with 0.5% Marcaine plain.  The foot was then prepped, scrubbed, and draped in the usual sterile technique.  The foot was elevated, exsanguinated and the pneumatic ankle tourniquet inflated to 250 mmHg.    Attention was directed to the dorsal aspect of the fifth metatarsophalangeal joint of the right foot.  2 converging semi-elliptical incisions were performed encompassing the digit its entirety.  Dissection was continued deep down to the level of the fifth metatarsophalangeal joint.  The fifth metatarsophalangeal joint was disarticulated.  The 5th digit was passed from the operative field.  The dorsal incision was continued proximally over the fifth metatarsal bone.  Dissection was continued deep down to the level of  the metatarsal bone.  Using a power bone saw the distal portion of the metatarsal bone was resected and sent to pathology for evaluation.  Using a power bone saw, a small segment of the residual fifth metatarsal bone was resected and sent to pathology for margins.  The wound was irrigated with copious amounts of sterile irrigant.  The subcutaneous structures were reapproximated using 4-0 Vicryl.  The skin was reapproximated using 4-0 Prolene in a horizontal mattress and simple suture technique.  A sterile compressive dressing with a wide to the right foot.  The pneumatic ankle tourniquet was deflated and a prompt hyperemic response was noted to all digits of the right foot.  The patient tolerated the procedure and anesthesia well.  She was transferred from the operating room to the post the anesthesia care unit with vital signs stable and vascular status intact to all digits.  Following a period of postoperative monitoring, the patient will be discharged home.

## 2012-11-03 NOTE — Anesthesia Postprocedure Evaluation (Signed)
  Anesthesia Post-op Note  Patient: Christy [name]  Procedure(s) Performed: Procedure(s) (LRB): AMPUTATION OF FIFTH TOE AND PORTION OF FIFTH METATARSAL (Right)  Patient Location:  Short Stay  Anesthesia Type: MAC  Level of Consciousness: awake  Airway and Oxygen Therapy: Patient Spontanous Breathing  Post-op Pain: none  Post-op Assessment: Post-op Vital signs reviewed, Patient's Cardiovascular Status Stable, Respiratory Function Stable, Patent Airway, No signs of Nausea or vomiting and Pain level controlled  Post-op Vital Signs: Reviewed and stable  Complications: No apparent anesthesia complications

## 2012-11-03 NOTE — Anesthesia Procedure Notes (Signed)
Procedure Name: MAC Date/Time: 11/03/2012 11:45 AM Performed by: Carolyne Littles, Anntonette Madewell L Pre-anesthesia Checklist: Patient identified, Timeout performed, Emergency Drugs available, Patient being monitored and Suction available Oxygen Delivery Method: Non-rebreather mask

## 2012-11-03 NOTE — Addendum Note (Signed)
Addended by: Ferman Hamming on: 11/03/2012 01:01 PM   Modules accepted: Orders

## 2012-11-03 NOTE — Transfer of Care (Signed)
Immediate Anesthesia Transfer of Care Note  Patient: Christy [name]  Procedure(s) Performed: Procedure(s) (LRB): AMPUTATION OF FIFTH TOE AND PORTION OF FIFTH METATARSAL (Right)  Patient Location: Shortstay  Anesthesia Type: MAC  Level of Consciousness: awake  Airway & Oxygen Therapy: Patient Spontanous Breathing   Post-op Assessment: Report given to PACU RN, Post -op Vital signs reviewed and stable and Patient moving all extremities  Post vital signs: Reviewed and stable  Complications: No apparent anesthesia complications

## 2012-11-03 NOTE — H&P (Signed)
HISTORY AND PHYSICAL INTERVAL NOTE:  11/03/2012  11:09 AM  Christy [name]  has presented today for surgery, with the diagnosis of osteomyelitis right foot.  The various methods of treatment have been discussed with the patient.  No guarantees were given.  After consideration of risks, benefits and other options for treatment, the patient has consented to surgery.  I have reviewed the patients' chart and labs.    Patient Vitals for the past 24 hrs:  BP Temp Resp SpO2  11/03/12 1100 86/49 mmHg - 15 97 %  11/03/12 1048 83/49 mmHg - 16 94 %  11/03/12 1047 98/54 mmHg - 18 94 %  11/03/12 1046 98/54 mmHg - 17 93 %  11/03/12 1045 98/54 mmHg - 17 93 %  11/03/12 1044 98/54 mmHg - 14 95 %  11/03/12 1043 98/54 mmHg - 13 94 %  11/03/12 1042 94/51 mmHg - 16 97 %  11/03/12 1041 94/51 mmHg - 14 95 %  11/03/12 1040 94/51 mmHg - 14 95 %  11/03/12 1039 94/51 mmHg - 16 90 %  11/03/12 1038 94/51 mmHg - 16 90 %  11/03/12 1037 97/50 mmHg - 16 90 %  11/03/12 1036 97/50 mmHg - 17 91 %  11/03/12 1035 97/50 mmHg - 15 90 %  11/03/12 1034 97/50 mmHg - 16 93 %  11/03/12 1033 97/50 mmHg - 27 90 %  11/03/12 1032 95/50 mmHg - 11 92 %  11/03/12 1031 95/50 mmHg - 16 90 %  11/03/12 1030 95/50 mmHg - 16 90 %  11/03/12 1029 95/50 mmHg - 15 90 %  11/03/12 1028 95/50 mmHg - 14 90 %  11/03/12 1027 92/51 mmHg - 15 90 %  11/03/12 1026 92/51 mmHg - 14 90 %  11/03/12 1025 92/51 mmHg - 16 90 %  11/03/12 1024 92/51 mmHg - 15 90 %  11/03/12 1023 92/51 mmHg - 15 90 %  11/03/12 1022 85/51 mmHg - 14 90 %  11/03/12 1021 85/51 mmHg - 16 90 %  11/03/12 1020 85/51 mmHg - 15 90 %  11/03/12 1015 89/56 mmHg - 16 90 %  11/03/12 1010 93/60 mmHg - 17 91 %  11/03/12 1005 87/64 mmHg 98.2 F (36.8 C) 14 94 %  11/03/12 1000 112/54 mmHg - 13 95 %  11/03/12 0955 103/57 mmHg - 18 97 %  11/03/12 0950 99/55 mmHg - 16 96 %  11/03/12 0945 106/53 mmHg - 18 95 %  11/03/12 0940 99/54 mmHg - 14 95 %  11/03/12 0935 105/55 mmHg - 12 97 %   11/03/12 0930 106/59 mmHg - 12 97 %    A history and physical examination was performed in my office.  The patient was reexamined.  There have been no changes to this history and physical examination.  Dallas Schimke, DPM

## 2012-11-03 NOTE — Anesthesia Preprocedure Evaluation (Signed)
Anesthesia Evaluation  Patient identified by MRN, date of birth, ID band Patient awake    Reviewed: Allergy & Precautions, H&P , NPO status , Patient's Chart, lab work & pertinent test results, reviewed documented beta blocker date and time   History of Anesthesia Complications (+) PONV  Airway Mallampati: I TM Distance: >3 FB     Dental  (+) Edentulous Upper   Pulmonary  breath sounds clear to auscultation        Cardiovascular hypertension, + CAD, + Past MI and + Cardiac Stents Rhythm:Regular Rate:Normal     Neuro/Psych PSYCHIATRIC DISORDERS Depression    GI/Hepatic negative GI ROS,   Endo/Other  diabetes, Well Controlled, Type 2, Oral Hypoglycemic Agents  Renal/GU      Musculoskeletal   Abdominal   Peds  Hematology   Anesthesia Other Findings   Reproductive/Obstetrics                           Anesthesia Physical Anesthesia Plan  ASA: III  Anesthesia Plan: MAC   Post-op Pain Management:    Induction: Intravenous  Airway Management Planned: Nasal Cannula  Additional Equipment:   Intra-op Plan:   Post-operative Plan:   Informed Consent: I have reviewed the patients History and Physical, chart, labs and discussed the procedure including the risks, benefits and alternatives for the proposed anesthesia with the patient or authorized representative who has indicated his/her understanding and acceptance.     Plan Discussed with:   Anesthesia Plan Comments:         Anesthesia Quick Evaluation

## 2012-11-03 NOTE — Preoperative (Signed)
Beta Blockers   Reason not to administer Beta Blockers:Not Applicable 

## 2012-11-03 NOTE — Brief Op Note (Signed)
BRIEF OPERATIVE NOTE  SURGEON:   Dallas Schimke, DPM  OR STAFF:   Circulator: Lennox Pippins, RN; Cyndie Chime, RN Relief Scrub: Hurshel Party, CST Scrub Person: Diana Eves, CST RN First Assistant: Lizabeth Leyden, RN   PREOPERATIVE DIAGNOSIS:   Osteomyelitis fifth metatarsal and fifth digit, right foot.  POSTOPERATIVE DIAGNOSIS: Same  PROCEDURE: Partial 5th ray amputation, right foot.  ANESTHESIA:  Monitor Anesthesia Care   HEMOSTASIS:   None  ESTIMATED BLOOD LOSS:   10 cc  MATERIALS USED:  None  INJECTABLES: Marcaine 0.5% plain; 20mL  PATHOLOGY:   1.  5th digit and portion of 5th metatarsal for aerobic and aerobic culture and sensitivity and pathology. 2.  5th metatarsal for margins.  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

## 2012-11-07 ENCOUNTER — Encounter (HOSPITAL_COMMUNITY): Payer: Self-pay | Admitting: Podiatry

## 2012-11-07 LAB — TISSUE CULTURE

## 2012-11-08 LAB — ANAEROBIC CULTURE

## 2012-11-22 ENCOUNTER — Encounter (HOSPITAL_COMMUNITY): Payer: Self-pay | Admitting: Pharmacy Technician

## 2012-11-22 ENCOUNTER — Other Ambulatory Visit: Payer: Self-pay | Admitting: Podiatry

## 2012-11-23 ENCOUNTER — Encounter (HOSPITAL_COMMUNITY): Admission: RE | Admit: 2012-11-23 | Payer: Managed Care, Other (non HMO) | Source: Ambulatory Visit

## 2012-11-23 ENCOUNTER — Encounter (HOSPITAL_COMMUNITY): Payer: Self-pay | Admitting: *Deleted

## 2012-11-23 MED ORDER — FENTANYL CITRATE 0.05 MG/ML IJ SOLN: 25.0000 ug | INTRAMUSCULAR | Status: DC | PRN

## 2012-11-23 MED ORDER — ONDANSETRON HCL 4 MG/2ML IJ SOLN: 4.0000 mg | Freq: Once | INTRAMUSCULAR | Status: AC | PRN

## 2012-11-24 ENCOUNTER — Encounter (HOSPITAL_COMMUNITY): Payer: Managed Care, Other (non HMO) | Admitting: Anesthesiology

## 2012-11-24 ENCOUNTER — Encounter (HOSPITAL_COMMUNITY): Payer: Self-pay | Admitting: *Deleted

## 2012-11-24 ENCOUNTER — Encounter (HOSPITAL_COMMUNITY)
Admission: RE | Disposition: A | Discharge status: Home / Self Care | Payer: Self-pay | Source: Ambulatory Visit | Attending: Podiatry

## 2012-11-24 ENCOUNTER — Ambulatory Visit (HOSPITAL_COMMUNITY): Payer: Managed Care, Other (non HMO)

## 2012-11-24 ENCOUNTER — Ambulatory Visit (HOSPITAL_COMMUNITY)
Admission: RE | Admit: 2012-11-24 | Discharge: 2012-11-24 | Disposition: A | Discharge status: Home / Self Care | Payer: Managed Care, Other (non HMO) | Source: Ambulatory Visit | Attending: Podiatry | Admitting: Podiatry

## 2012-11-24 ENCOUNTER — Ambulatory Visit (HOSPITAL_COMMUNITY): Payer: Managed Care, Other (non HMO) | Admitting: Anesthesiology

## 2012-11-24 DIAGNOSIS — E1142 Type 2 diabetes mellitus with diabetic polyneuropathy: Secondary | ICD-10-CM | POA: Insufficient documentation

## 2012-11-24 DIAGNOSIS — S91301D Unspecified open wound, right foot, subsequent encounter: Secondary | ICD-10-CM

## 2012-11-24 DIAGNOSIS — Z01812 Encounter for preprocedural laboratory examination: Secondary | ICD-10-CM | POA: Insufficient documentation

## 2012-11-24 DIAGNOSIS — T8189XA Other complications of procedures, not elsewhere classified, initial encounter: Secondary | ICD-10-CM | POA: Insufficient documentation

## 2012-11-24 DIAGNOSIS — Y835 Amputation of limb(s) as the cause of abnormal reaction of the patient, or of later complication, without mention of misadventure at the time of the procedure: Secondary | ICD-10-CM | POA: Insufficient documentation

## 2012-11-24 DIAGNOSIS — E1149 Type 2 diabetes mellitus with other diabetic neurological complication: Secondary | ICD-10-CM | POA: Insufficient documentation

## 2012-11-24 HISTORY — PX: APPLICATION OF WOUND VAC: SHX5189

## 2012-11-24 HISTORY — PX: INCISION AND DRAINAGE OF WOUND: SHX1803

## 2012-11-24 LAB — GLUCOSE, CAPILLARY: Glucose-Capillary: 103 mg/dL — ABNORMAL HIGH (ref 70–99)

## 2012-11-24 SURGERY — IRRIGATION AND DEBRIDEMENT WOUND
Anesthesia: Monitor Anesthesia Care | Site: Foot | Laterality: Right | Wound class: Dirty or Infected

## 2012-11-24 MED ORDER — PROPOFOL 10 MG/ML IV EMUL
INTRAVENOUS | Status: AC
  Filled 2012-11-24: qty 20

## 2012-11-24 MED ORDER — MIDAZOLAM HCL 2 MG/2ML IJ SOLN
1.0000 mg | INTRAMUSCULAR | Status: DC | PRN
  Administered 2012-11-24 (×2): 2 mg via INTRAVENOUS

## 2012-11-24 MED ORDER — MIDAZOLAM HCL 5 MG/5ML IJ SOLN
INTRAMUSCULAR | Status: DC | PRN
  Administered 2012-11-24: 2 mg via INTRAVENOUS

## 2012-11-24 MED ORDER — EPHEDRINE SULFATE 50 MG/ML IJ SOLN
INTRAMUSCULAR | Status: DC | PRN
  Administered 2012-11-24: 15 mg via INTRAVENOUS

## 2012-11-24 MED ORDER — PROPOFOL INFUSION 10 MG/ML OPTIME
INTRAVENOUS | Status: DC | PRN
  Administered 2012-11-24: 55 ug/kg/min via INTRAVENOUS

## 2012-11-24 MED ORDER — LACTATED RINGERS IV SOLN
INTRAVENOUS | Status: DC
  Administered 2012-11-24: 11:00:00 via INTRAVENOUS

## 2012-11-24 MED ORDER — BUPIVACAINE HCL (PF) 0.5 % IJ SOLN
INTRAMUSCULAR | Status: AC
  Filled 2012-11-24: qty 30

## 2012-11-24 MED ORDER — CEFAZOLIN SODIUM-DEXTROSE 2-3 GM-% IV SOLR
2.0000 g | INTRAVENOUS | Status: AC
  Administered 2012-11-24: 2 g via INTRAVENOUS

## 2012-11-24 MED ORDER — AMOXICILLIN-POT CLAVULANATE 875-125 MG PO TABS: 1.0000 | ORAL_TABLET | Freq: Two times a day (BID) | ORAL | Status: DC

## 2012-11-24 MED ORDER — EPHEDRINE SULFATE 50 MG/ML IJ SOLN
INTRAMUSCULAR | Status: AC
  Filled 2012-11-24: qty 1

## 2012-11-24 MED ORDER — BUPIVACAINE HCL (PF) 0.5 % IJ SOLN
INTRAMUSCULAR | Status: DC | PRN
  Administered 2012-11-24: 20 mL

## 2012-11-24 MED ORDER — MIDAZOLAM HCL 2 MG/2ML IJ SOLN
INTRAMUSCULAR | Status: AC
  Filled 2012-11-24: qty 2

## 2012-11-24 MED ORDER — FENTANYL CITRATE 0.05 MG/ML IJ SOLN
INTRAMUSCULAR | Status: AC
  Filled 2012-11-24: qty 2

## 2012-11-24 MED ORDER — CEFAZOLIN SODIUM-DEXTROSE 2-3 GM-% IV SOLR
2.0000 g | INTRAVENOUS | Status: DC
  Filled 2012-11-24: qty 50

## 2012-11-24 MED ORDER — ONDANSETRON HCL 4 MG/2ML IJ SOLN
4.0000 mg | Freq: Once | INTRAMUSCULAR | Status: AC
  Administered 2012-11-24: 4 mg via INTRAVENOUS

## 2012-11-24 MED ORDER — FENTANYL CITRATE 0.05 MG/ML IJ SOLN
INTRAMUSCULAR | Status: DC | PRN
  Administered 2012-11-24: 50 ug via INTRAVENOUS

## 2012-11-24 MED ORDER — ONDANSETRON HCL 4 MG/2ML IJ SOLN
INTRAMUSCULAR | Status: AC
  Filled 2012-11-24: qty 2

## 2012-11-24 MED ORDER — SODIUM CHLORIDE 0.9 % IR SOLN
Status: DC | PRN
  Administered 2012-11-24: 1000 mL

## 2012-11-24 MED ORDER — FENTANYL CITRATE 0.05 MG/ML IJ SOLN
25.0000 ug | INTRAMUSCULAR | Status: AC
  Administered 2012-11-24 (×2): 25 ug via INTRAVENOUS

## 2012-11-24 MED ORDER — SODIUM CHLORIDE BACTERIOSTATIC 0.9 % IJ SOLN
INTRAMUSCULAR | Status: AC
  Filled 2012-11-24: qty 10

## 2012-11-24 SURGICAL SUPPLY — 36 items
APL SKNCLS STERI-STRIP NONHPOA (GAUZE/BANDAGES/DRESSINGS) ×1
BAG HAMPER (MISCELLANEOUS) ×2 IMPLANT
BANDAGE ELASTIC 4 VELCRO NS (GAUZE/BANDAGES/DRESSINGS) ×2 IMPLANT
BANDAGE ELASTIC 6 VELCRO NS (GAUZE/BANDAGES/DRESSINGS) ×1 IMPLANT
BANDAGE ESMARK 4X12 BL STRL LF (DISPOSABLE) ×1 IMPLANT
BANDAGE GAUZE ELAST BULKY 4 IN (GAUZE/BANDAGES/DRESSINGS) ×2 IMPLANT
BENZOIN TINCTURE PRP APPL 2/3 (GAUZE/BANDAGES/DRESSINGS) ×2 IMPLANT
BLADE AVERAGE 25X9 (BLADE) ×1 IMPLANT
BLADE SURG 15 STRL LF DISP TIS (BLADE) ×2 IMPLANT
BLADE SURG 15 STRL SS (BLADE) ×4
BNDG CMPR 12X4 ELC STRL LF (DISPOSABLE) ×1
BNDG ESMARK 4X12 BLUE STRL LF (DISPOSABLE) ×2
CLOTH BEACON ORANGE TIMEOUT ST (SAFETY) ×2 IMPLANT
COVER LIGHT HANDLE STERIS (MISCELLANEOUS) ×4 IMPLANT
CUFF TOURNIQUET SINGLE 18IN (TOURNIQUET CUFF) ×1 IMPLANT
DECANTER SPIKE VIAL GLASS SM (MISCELLANEOUS) ×2 IMPLANT
ELECT REM PT RETURN 9FT ADLT (ELECTROSURGICAL) ×2
ELECTRODE REM PT RTRN 9FT ADLT (ELECTROSURGICAL) ×1 IMPLANT
GLOVE BIO SURGEON STRL SZ7.5 (GLOVE) ×2 IMPLANT
GLOVE BIOGEL PI IND STRL 7.0 (GLOVE) IMPLANT
GLOVE BIOGEL PI INDICATOR 7.0 (GLOVE) ×2
GLOVE EXAM NITRILE LRG STRL (GLOVE) ×1 IMPLANT
GLOVE SS BIOGEL STRL SZ 6.5 (GLOVE) IMPLANT
GLOVE SUPERSENSE BIOGEL SZ 6.5 (GLOVE) ×1
GOWN STRL REIN XL XLG (GOWN DISPOSABLE) ×6 IMPLANT
KIT ROOM TURNOVER AP CYSTO (KITS) ×2 IMPLANT
MANIFOLD NEPTUNE II (INSTRUMENTS) ×2 IMPLANT
NDL HYPO 27GX1-1/4 (NEEDLE) ×3 IMPLANT
NEEDLE HYPO 27GX1-1/4 (NEEDLE) ×4 IMPLANT
NS IRRIG 1000ML POUR BTL (IV SOLUTION) ×2 IMPLANT
PACK BASIC LIMB (CUSTOM PROCEDURE TRAY) ×2 IMPLANT
PAD ARMBOARD 7.5X6 YLW CONV (MISCELLANEOUS) ×2 IMPLANT
SET BASIN LINEN APH (SET/KITS/TRAYS/PACK) ×2 IMPLANT
SPONGE GAUZE 4X4 12PLY (GAUZE/BANDAGES/DRESSINGS) ×2 IMPLANT
SYR BULB IRRIGATION 50ML (SYRINGE) ×2 IMPLANT
SYR CONTROL 10ML LL (SYRINGE) ×4 IMPLANT

## 2012-11-24 NOTE — Transfer of Care (Signed)
Immediate Anesthesia Transfer of Care Note  Patient: Christy [name]  Procedure(s) Performed: Procedure(s): DEBRIDEMENT WOUND FOOT (Right) APPLICATION OF WOUND VAC (Right)  Patient Location: Short Stay  Anesthesia Type:MAC  Level of Consciousness: awake, alert , oriented and patient cooperative  Airway & Oxygen Therapy: Patient Spontanous Breathing and Patient connected to nasal cannula oxygen  Post-op Assessment: Report given to PACU RN, Post -op Vital signs reviewed and stable and Patient moving all extremities  Post vital signs: Reviewed and stable  Complications: No apparent anesthesia complications

## 2012-11-24 NOTE — Addendum Note (Signed)
Addended by: Ferman Hamming on: 11/24/2012 10:33 AM   Modules accepted: Orders

## 2012-11-24 NOTE — Op Note (Addendum)
OPERATIVE NOTE  DATE OF PROCEDURE:  11/24/2012  SURGEON:   Dallas Schimke, DPM  OR STAFF:   Circulator: Larwance Rote Protzek, RN Scrub Person: Diana Eves, CST Circulator Assistant: Rogene Houston, RN   PREOPERATIVE DIAGNOSIS:   1.  Non-healing wound, right foot. 2.  Diabetes mellitus with peripheral neuropathy.  POSTOPERATIVE DIAGNOSIS: Same  PROCEDURE: 1.  Excisional debridement of non-healing wound down to level and including bone, right foot. 2.  Application of KCI Wound VAC, right foot.  ANESTHESIA:  Monitor Anesthesia Care   HEMOSTASIS:   Pneumatic ankle tourniquet set at 250 mmHg  ESTIMATED BLOOD LOSS:   Minimal (<5 cc)  MATERIALS USED:  KCI Wound VAC  INJECTABLES: Marcaine 0.5% plain; 20mL  PATHOLOGY:   1.  5th metatarsal bone, right foot. 2.  5th metatarsal bone, right foot for margins.  COMPLICATIONS:   None  INDICATIONS:  Non-healing wound following partial 5th ray amputation for treatment of osteomyelitis.  DESCRIPTION OF THE PROCEDURE:   The patient was brought to the operating room and placed on the operative table in the supine position.  A pneumatic ankle tourniquet was applied to the patient's ankle.  Following sedation, the surgical site was anesthetized with 0.5% Marcaine plain.  The foot was then prepped, scrubbed, and draped in the usual sterile technique.  The foot was elevated, exsanguinated and the pneumatic ankle tourniquet inflated to 250 mmHg.    Attention was directed to the lateral aspect of the metatarsal head region of the right foot.  The site of the previous surgical incision was identified.  Dehiscence of the proximal aspect of the wound was identified.  Fibrotic tissue was encountered.  Using a #15 blade, an excisional debridement of the wound of the right foot was performed.  Fibrous tissue was removed.  The residual portion of the fifth metatarsal was identified.  The distal portion of the fifth metatarsal bone  appeared dark and there was concern that this bone was nonviable and possibly represented residual osteomyelitis.  Using a power bone saw the distal portion of the fifth metatarsal bone was resected and sent to pathology for evaluation.  Using a power bone saw, a second osteotomy was performed creating a small wafer of bone to send for margins.  This bone was sent to pathology for evaluation.  The surgical wound was then inspected distally and tunneling of the wound extend the entire length of the surgical scar.  2 converging semi-elliptical incisions were then performed encompassing the residual surgical scar.  The skin was removed and passed from the operative field.  Using a curette, the wound was debrided and residual fibrous tissue removed down to a viable, bleeding base.  The wound was irrigated with copious amounts of sterile irrigant.  Following debridement, the wound measured 6.1 cm in length by 2.9 cm in width by 1.8 cm in depth.  A KCI wound VAC was then applied and found to be operational at 125 mm to use pressure.  A gauze dressing was then applied over the wound VAC dressing.   The patient tolerated the procedure well.  The patient was then transferred to Short Stay with vital signs stable and vascular status intact to all toes of the operative foot.  Following a period of postoperative monitoring, the patient will be discharged home.

## 2012-11-24 NOTE — Anesthesia Postprocedure Evaluation (Signed)
  Anesthesia Post-op Note  Patient: Christy [name]  Procedure(s) Performed: Procedure(s): DEBRIDEMENT WOUND FOOT (Right) APPLICATION OF WOUND VAC (Right)  Patient Location: Short Stay  Anesthesia Type:MAC  Level of Consciousness: awake, alert , oriented and patient cooperative  Airway and Oxygen Therapy: Patient connected to nasal cannula oxygen  Post-op Pain: none  Post-op Assessment: Post-op Vital signs reviewed, Patient's Cardiovascular Status Stable, Respiratory Function Stable, Patent Airway, No signs of Nausea or vomiting, Adequate PO intake and Pain level controlled  Post-op Vital Signs: Reviewed and stable  Complications: No apparent anesthesia complications

## 2012-11-24 NOTE — Brief Op Note (Signed)
BRIEF OPERATIVE NOTE  SURGEON:   Dallas Schimke, DPM  OR STAFF:   Circulator: Larwance Rote Protzek, RN Scrub Person: Diana Eves, CST Circulator Assistant: Rogene Houston, RN   PREOPERATIVE DIAGNOSIS:   1.  Non-healing wound, right foot. 2.  Diabetes mellitus with peripheral neuropathy.  POSTOPERATIVE DIAGNOSIS: Same  PROCEDURE: 1.  Excisional debridement of non-healing wound down to level and including bone, right foot. 2.  Application of KCI Wound VAC, right foot.  ANESTHESIA:  Monitor Anesthesia Care   HEMOSTASIS:   Pneumatic ankle tourniquet set at 250 mmHg  ESTIMATED BLOOD LOSS:   Minimal (<5 cc)  MATERIALS USED:  KCI Wound VAC  INJECTABLES: Marcaine 0.5% plain; 20mL  PATHOLOGY:   1.  5th metatarsal bone, right foot. 2.  5th metatarsal bone, right foot for margins.  COMPLICATIONS:   None  INDICATIONS:  Non-healing wound following partial 5th ray amputation for treatment of osteomyelitis.  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

## 2012-11-24 NOTE — Anesthesia Preprocedure Evaluation (Signed)
Anesthesia Evaluation  Patient identified by MRN, date of birth, ID band Patient awake    Reviewed: Allergy & Precautions, H&P , NPO status , Patient's Chart, lab work & pertinent test results, reviewed documented beta blocker date and time   History of Anesthesia Complications (+) PONV and history of anesthetic complications  Airway Mallampati: I TM Distance: >3 FB     Dental  (+) Edentulous Upper   Pulmonary  breath sounds clear to auscultation        Cardiovascular hypertension, + CAD, + Past MI and + Cardiac Stents Rhythm:Regular Rate:Normal     Neuro/Psych PSYCHIATRIC DISORDERS Depression    GI/Hepatic negative GI ROS,   Endo/Other  diabetes, Well Controlled, Type 2, Oral Hypoglycemic Agents  Renal/GU      Musculoskeletal   Abdominal   Peds  Hematology   Anesthesia Other Findings   Reproductive/Obstetrics                           Anesthesia Physical Anesthesia Plan  ASA: III  Anesthesia Plan: MAC   Post-op Pain Management:    Induction: Intravenous  Airway Management Planned: Nasal Cannula  Additional Equipment:   Intra-op Plan:   Post-operative Plan:   Informed Consent: I have reviewed the patients History and Physical, chart, labs and discussed the procedure including the risks, benefits and alternatives for the proposed anesthesia with the patient or authorized representative who has indicated his/her understanding and acceptance.     Plan Discussed with:   Anesthesia Plan Comments:         Anesthesia Quick Evaluation

## 2012-11-24 NOTE — H&P (Signed)
HISTORY AND PHYSICAL INTERVAL NOTE:  11/24/2012  12:38 PM  Christy [name]  has presented today for surgery, with the diagnosis of non healing wound right foot.  The various methods of treatment have been discussed with the patient.  No guarantees were given.  After consideration of risks, benefits and other options for treatment, the patient has consented to surgery.  I have reviewed the patients' chart and labs.    Patient Vitals for the past 24 hrs:  BP Temp Temp src Pulse Resp SpO2 Height Weight  11/24/12 1215 81/47 mmHg - - - 21 97 % - -  11/24/12 1210 106/44 mmHg - - - 18 97 % - -  11/24/12 1205 92/51 mmHg - - - 22 98 % - -  11/24/12 1200 79/44 mmHg - - - 13 95 % - -  11/24/12 1155 79/43 mmHg - - - 14 95 % - -  11/24/12 1150 82/48 mmHg - - - 17 96 % - -  11/24/12 1145 74/47 mmHg - - - 16 95 % - -  11/24/12 1140 85/52 mmHg - - - 12 96 % - -  11/24/12 1135 100/53 mmHg - - - 17 94 % - -  11/24/12 1130 80/47 mmHg - - - 21 95 % - -  11/24/12 1125 55/33 mmHg - - - 24 96 % - -  11/24/12 1120 79/46 mmHg - - - 10 96 % - -  11/24/12 1115 95/52 mmHg - - - 16 95 % - -  11/24/12 1110 91/51 mmHg - - - 20 95 % - -  11/24/12 1105 97/51 mmHg - - - 16 94 % - -  11/24/12 1100 106/60 mmHg - - - 16 98 % - -  11/24/12 1056 - 98.4 F (36.9 C) Oral 47 20 95 % 5\' 5"  (1.651 m) 106.142 kg (234 lb)  11/24/12 1055 101/58 mmHg - - - 17 94 % - -  11/24/12 1050 - - - - 16 96 % - -    A history and physical examination was performed in my office.  The patient was reexamined.  There have been no changes to this history and physical examination.  Dallas Schimke, DPM

## 2012-11-25 ENCOUNTER — Encounter (HOSPITAL_COMMUNITY): Payer: Self-pay | Admitting: Podiatry

## 2012-12-22 ENCOUNTER — Other Ambulatory Visit: Payer: Self-pay

## 2013-03-05 ENCOUNTER — Emergency Department (HOSPITAL_COMMUNITY)
Admission: EM | Admit: 2013-03-05 | Discharge: 2013-03-05 | Disposition: A | Discharge status: Home / Self Care | Payer: Managed Care, Other (non HMO) | Attending: Emergency Medicine | Admitting: Emergency Medicine

## 2013-03-05 ENCOUNTER — Encounter (HOSPITAL_COMMUNITY): Payer: Self-pay | Admitting: Emergency Medicine

## 2013-03-05 DIAGNOSIS — E119 Type 2 diabetes mellitus without complications: Secondary | ICD-10-CM | POA: Insufficient documentation

## 2013-03-05 DIAGNOSIS — Z9861 Coronary angioplasty status: Secondary | ICD-10-CM | POA: Insufficient documentation

## 2013-03-05 DIAGNOSIS — Z7982 Long term (current) use of aspirin: Secondary | ICD-10-CM | POA: Insufficient documentation

## 2013-03-05 DIAGNOSIS — N390 Urinary tract infection, site not specified: Secondary | ICD-10-CM | POA: Insufficient documentation

## 2013-03-05 DIAGNOSIS — Z792 Long term (current) use of antibiotics: Secondary | ICD-10-CM | POA: Insufficient documentation

## 2013-03-05 DIAGNOSIS — R110 Nausea: Secondary | ICD-10-CM | POA: Insufficient documentation

## 2013-03-05 DIAGNOSIS — I252 Old myocardial infarction: Secondary | ICD-10-CM | POA: Insufficient documentation

## 2013-03-05 DIAGNOSIS — I10 Essential (primary) hypertension: Secondary | ICD-10-CM | POA: Insufficient documentation

## 2013-03-05 DIAGNOSIS — Z79899 Other long term (current) drug therapy: Secondary | ICD-10-CM | POA: Insufficient documentation

## 2013-03-05 DIAGNOSIS — Z8659 Personal history of other mental and behavioral disorders: Secondary | ICD-10-CM | POA: Insufficient documentation

## 2013-03-05 DIAGNOSIS — F17200 Nicotine dependence, unspecified, uncomplicated: Secondary | ICD-10-CM | POA: Insufficient documentation

## 2013-03-05 LAB — URINALYSIS, ROUTINE W REFLEX MICROSCOPIC
BILIRUBIN URINE: NEGATIVE
GLUCOSE, UA: NEGATIVE mg/dL
Ketones, ur: NEGATIVE mg/dL
Nitrite: NEGATIVE
PH: 6 (ref 5.0–8.0)
Protein, ur: 30 mg/dL — AB
SPECIFIC GRAVITY, URINE: 1.02 (ref 1.005–1.030)
UROBILINOGEN UA: 0.2 mg/dL (ref 0.0–1.0)

## 2013-03-05 LAB — URINE MICROSCOPIC-ADD ON

## 2013-03-05 MED ORDER — CEFTRIAXONE SODIUM 1 G IJ SOLR
1.0000 g | Freq: Once | INTRAMUSCULAR | Status: AC
  Administered 2013-03-05: 1 g via INTRAMUSCULAR
  Filled 2013-03-05: qty 10

## 2013-03-05 MED ORDER — PHENAZOPYRIDINE HCL 100 MG PO TABS
200.0000 mg | ORAL_TABLET | Freq: Once | ORAL | Status: DC
  Filled 2013-03-05: qty 2

## 2013-03-05 MED ORDER — CEPHALEXIN 500 MG PO CAPS: 500.0000 mg | ORAL_CAPSULE | Freq: Four times a day (QID) | ORAL | Status: DC

## 2013-03-05 MED ORDER — LIDOCAINE HCL (PF) 1 % IJ SOLN
INTRAMUSCULAR | Status: AC
  Administered 2013-03-05: 2.1 mL
  Filled 2013-03-05: qty 5

## 2013-03-05 NOTE — ED Notes (Signed)
MD at bedside. 

## 2013-03-05 NOTE — ED Provider Notes (Signed)
CSN: [id number]     Arrival date & time 03/05/13  0414 History   First MD Initiated Contact with Patient 03/05/13 [id number]     Chief Complaint  Patient presents with  . Dysuria   (Consider location/radiation/quality/duration/timing/severity/associated sxs/prior Treatment) HPI History provided by patient. Dysuria onset yesterday, worse today. Burns when she urinates with associated urgency and frequency. She has some nausea but no vomiting. No fevers or chills. No back pain. No weakness or dizziness. Symptoms moderate in severity. Patient states feels like she has a bladder infection. Past Medical History  Diagnosis Date  . 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  . Tobacco abuse     60-90-pack-year history; 1/3 pack per day in 2013  . Hypertension   . Diabetes mellitus, type 2   . Depression 2012    Following sudden death of husband  . Metabolic syndrome     Elevated triglycerides, low HDL, fasting hyperglycemia with low-dose sulfonylurea  . Myocardial infarction 09/2010  . PONV (postoperative nausea and vomiting)    Past Surgical History  Procedure Laterality Date  . Coronary angioplasty with stent placement  10/01/10    DES x2  . Cholecystectomy    . Bilateral oophorectomy  2011    for benign mass  . 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 Incision and drainage of wound Right 11/24/2012    Procedure: DEBRIDEMENT WOUND FOOT;  Surgeon: Dallas SchimkeBenjamin Ivan McKinney, DPM;  Location: [facility];  Service: Orthopedics;  Laterality: Right;  . Application of wound vac Right 11/24/2012    Procedure: APPLICATION OF WOUND VAC;  Surgeon: Dallas SchimkeBenjamin Ivan McKinney, DPM;  Location: [facility];  Service: Orthopedics;  Laterality: Right;  . Toe amputation     Family History   Problem Relation Age of Onset  . Heart disease Father   . Heart disease Mother    History  Substance Use Topics  . Smoking status: Current Every Day Smoker -- 0.50 packs/day for 30 years    Types: Cigarettes  . Smokeless tobacco: Not on file  . Alcohol Use: No   OB History   Grav Para Term Preterm Abortions TAB SAB Ect Mult Living                 Review of Systems  Constitutional: Negative for fever and chills.  Respiratory: Negative for shortness of breath.   Cardiovascular: Negative for chest pain.  Gastrointestinal: Negative for abdominal pain.  Genitourinary: Positive for dysuria, urgency and frequency. Negative for flank pain.  Musculoskeletal: Negative for back pain, neck pain and neck stiffness.  Skin: Negative for rash.  Neurological: Negative for headaches.  All other systems reviewed and are negative.    Allergies  Bactrim and Isosorbide mononitrate  Home Medications   Current Outpatient Rx  Name  Route  Sig  Dispense  Refill  . aspirin 81 MG tablet   Oral   Take 81 mg by mouth daily.           . fish oil-omega-3 fatty acids 1000 MG capsule   Oral   Take 3 g by mouth daily.          Marland Kitchen. glipiZIDE (GLUCOTROL) 5 MG tablet   Oral   Take 5 mg by mouth daily.         .Marland Kitchen  metoprolol tartrate (LOPRESSOR) 25 MG tablet   Oral   Take 1 tablet (25 mg total) by mouth 2 (two) times daily.   180 tablet   3   . niacin 500 MG CR capsule   Oral   Take 1 capsule (500 mg total) by mouth at bedtime.   30 capsule   6   . nitroGLYCERIN (NITROSTAT) 0.4 MG SL tablet   Sublingual   Place 1 tablet (0.4 mg total) under the tongue every 5 (five) minutes as needed.   25 tablet   5   . simvastatin (ZOCOR) 20 MG tablet   Oral   Take 20 mg by mouth at bedtime.         Marland Kitchen amoxicillin-clavulanate (AUGMENTIN) 875-125 MG per tablet   Oral   Take 1 tablet by mouth 2 (two) times daily.   20 tablet   0   . cephALEXin (KEFLEX) 500 MG capsule   Oral   Take 1  capsule (500 mg total) by mouth 4 (four) times daily.   28 capsule   0   . doxycycline (VIBRA-TABS) 100 MG tablet   Oral   Take 100 mg by mouth 2 (two) times daily.         Marland Kitchen oxyCODONE-acetaminophen (PERCOCET/ROXICET) 5-325 MG per tablet   Oral   Take 1 tablet by mouth every 4 (four) hours as needed for pain.          BP 127/61  Pulse 63  Temp(Src) 97.7 F (36.5 C) (Oral)  Resp 20  Ht 5\' 6"  (1.676 m)  Wt 242 lb (109.77 kg)  BMI 39.08 kg/m2  SpO2 96% Physical Exam  Constitutional: She is oriented to person, place, and time. She appears well-developed and well-nourished.  HENT:  Head: Normocephalic and atraumatic.  Eyes: EOM are normal. Pupils are equal, round, and reactive to light.  Neck: Neck supple.  Cardiovascular: Normal rate, regular rhythm and intact distal pulses.   Pulmonary/Chest: Effort normal and breath sounds normal. No respiratory distress.  Abdominal: Soft. Bowel sounds are normal. She exhibits no distension. There is no tenderness.   No CVA tenderness  Musculoskeletal: Normal range of motion. She exhibits no edema.  Neurological: She is alert and oriented to person, place, and time.  Skin: Skin is warm and dry.    ED Course  Procedures (including critical care time) Labs Review Labs Reviewed  URINALYSIS, ROUTINE W REFLEX MICROSCOPIC - Abnormal; Notable for the following:    APPearance HAZY (*)    Hgb urine dipstick LARGE (*)    Protein, ur 30 (*)    Leukocytes, UA MODERATE (*)    All other components within normal limits  URINE MICROSCOPIC-ADD ON - Abnormal; Notable for the following:    Bacteria, UA MANY (*)    All other components within normal limits  URINE CULTURE   Rocephin 1 g IM provided. Patient declines AZO  Plan discharge home with prescription for Keflex. Urine culture pending. Patient will followup with her primary care physician in 2 days for followup urine culture results. She agrees to return precautions. Stable for discharge  home at this time.  MDM   1. UTI (lower urinary tract infection)    Urinalysis reviewed. Urine culture sent. Medication provided. Vital signs and nurses notes reviewed and considered.    Sunnie Nielsen, MD 03/05/13 [phone]

## 2013-03-05 NOTE — ED Notes (Signed)
Pt c/o burning with urination, nausea, and urinary urgency.

## 2013-03-07 LAB — URINE CULTURE: Colony Count: >=100000

## 2013-03-08 NOTE — ED Notes (Signed)
+   Urine Patient treated per protocol MD. 

## 2013-03-21 ENCOUNTER — Encounter (HOSPITAL_COMMUNITY): Payer: Self-pay

## 2013-03-21 ENCOUNTER — Other Ambulatory Visit: Payer: Self-pay | Admitting: Podiatry

## 2013-03-21 ENCOUNTER — Encounter (HOSPITAL_COMMUNITY): Payer: Self-pay | Admitting: Pharmacy Technician

## 2013-03-21 ENCOUNTER — Encounter (HOSPITAL_COMMUNITY)
Admission: RE | Admit: 2013-03-21 | Discharge: 2013-03-21 | Disposition: A | Discharge status: Home / Self Care | Payer: Managed Care, Other (non HMO) | Source: Ambulatory Visit | Attending: Podiatry | Admitting: Podiatry

## 2013-03-21 LAB — BASIC METABOLIC PANEL
BUN: 16 mg/dL (ref 6–23)
CO2: 25 meq/L (ref 19–32)
CREATININE: 1.12 mg/dL — AB (ref 0.50–1.10)
Calcium: 9.6 mg/dL (ref 8.4–10.5)
Chloride: 99 mEq/L (ref 96–112)
GFR calc non Af Amer: 55 mL/min — ABNORMAL LOW (ref >90)
GFR, EST AFRICAN AMERICAN: 64 mL/min — AB (ref >90)
Glucose, Bld: 152 mg/dL — ABNORMAL HIGH (ref 70–99)
Potassium: 4.3 mEq/L (ref 3.7–5.3)
Sodium: 138 mEq/L (ref 137–147)

## 2013-03-21 LAB — HEMOGLOBIN AND HEMATOCRIT, BLOOD
HCT: 43 % (ref 36.0–46.0)
HEMOGLOBIN: 14.5 g/dL (ref 12.0–15.0)

## 2013-03-21 NOTE — Patient Instructions (Addendum)
Christy [name]  03/21/2013   Your procedure is scheduled on:   03/23/2013  Report to [facility] at  615  AM.  Call this number if you have problems the morning of surgery: [phone]   Remember:   Do not eat food or drink liquids after midnight.   Take these medicines the morning of surgery with A SIP OF WATER: nmetoprolol. oxycodone   Do not wear jewelry, make-up or nail polish.  Do not wear lotions, powders, or perfumes.   Do not shave 48 hours prior to surgery. Men may shave face and neck.  Do not bring valuables to the hospital.  [facility] is not responsible for any belongings or valuables.               Contacts, dentures or bridgework may not be worn into surgery.  Leave suitcase in the car. After surgery it may be brought to your room.  For patients admitted to the hospital, discharge time is determined by your treatment team.               Patients discharged the day of surgery will not be allowed to drive home.  Name and phone number of your driver: family  Special Instructions: Shower using CHG 2 nights before surgery and the night before surgery.  If you shower the day of surgery use CHG.  Use special wash - you have one bottle of CHG for all showers.  You should use approximately 1/3 of the bottle for each shower.   Please read over the following fact sheets that you were given: Pain Booklet, Coughing and Deep Breathing, Surgical Site Infection Prevention, Anesthesia Post-op Instructions and Care and Recovery After Surgery Wound Debridement Wound debridement is a procedure used to remove dead tissue and contaminated substances from a wound. A wound must be clean to heal. It also must get a good supply of blood. Anything that is stopping this must be taken out of the wound. This could be dead tissue, scar tissue, fluid buildup, or debris from outside of the body. Wounds that are not cleaned by debridement heal slowly or not at all. They can become infected. Any infection  in the tissue can also spread to nearby areas or to other parts of the body through the blood. Wound debridement can be done through a surgical procedure or various other methods. Debridement is sometimes done to get a sample of tissue from the wound. The tissue can be checked under a microscope or sent to a lab for testing.  LET YOUR CAREGIVER KNOW ABOUT:   Any allergies you have.  All medicines you are taking, including vitamins, steroids, herbs, eyedrops, and over-the-counter medicines and creams.   Previous problems you or members of your family have had with the use of anesthetics.   Any blood disorders you have had.   Previous surgeries you have had.   Other health problems you have.  RISKS AND COMPLICATIONS  Generally, wound debridement is a safe procedure. However, as with any medical procedure, complications can occur. Possible complications include:  Bleeding that does not stop.   Infection.   Damage to nerves, blood vessels, or healthy tissue inside the wound.   Pain.   Lack of healing. BEFORE THE PROCEDURE   The caregiver will check the wound for signs of healing or infection. A measurement of the wound will be taken, including how deep it is. A metal tool (probe) may be used. Blood tests  may be done to check for infection.   If you will be given medicine to make you sleep through the procedure (general anesthetic), do not eat or drink anything for at least 6 hours before the procedure. Ask your caregiver if it is okay to have a sip of water with any needed medicine.   Make plans to have someone drive you home after the procedure. Also, make sure someone can stay with you for a few days.  PROCEDURE  The following methods may be used alone or in combination. Surgical debridement:  Small monitors may be placed on your body. They are used to check your heart, blood pressure, and oxygen level.   You may be given medicine through an intravenous (IV) access  tube in your hand or arm.   You might be given medicine to help you relax (sedative).   You may be given medicine to numb the area around the wound (local anesthetic). If the wound is deep or wide, you may be given general anesthetic to make you sleep through the procedure.   Once you are asleep or the wound area is numb, the wound may be washed with a sterile saltwater solution.   Scissors, surgical knives (scalpels), and surgical tweezers (forceps) will be used to remove dead or dying tissue. Any other material that should not be in the wound will also be taken out.   After the tissue and other material have been removed from the wound, the wound will be washed again.   A bandage (dressing) may be placed over the wound.  Mechanical debridement: Mechanical debridement may involve various techniques:  A dressing may be used to pull off dead tissue. A moist dressing is placed over the wound. It is left in place until it is dry. When the dressing is lifted off, this lifts away the dead tissue.   Whirlpool baths may be used to flush the wound with forceful streams of hot water.   The wound may be flushed with sterile solution.  Surgical instruments that use water under high pressure may be used to clean the wound. Chemical debridement:  A chemical medicine is put on the wound. The aim is to dissolve dead or dying tissue. Ointments may also be used. Autolytic debridement:  A special dressing is used to trap moisture inside the wound. The goal is for the wound to heal naturally under the dressing. Healing takes longer with this treatment. AFTER THE PROCEDURE   If a local anesthetic is used, you will be allowed to go home as soon as you are ready. If a general anesthetic is used, you will be taken to a recovery area until you are stable. Your blood pressure and pulse will be checked often. You may continue to get fluids through the IV tube for a while. Once you are stable, you may  be able to go home, or you may need to stay in the hospital overnight.Your caregiver will decide when you can go home.   You may feel some pain. You will likely be given medicine for pain.  Before you go home, make sure you know how to care for the wound. This includes knowing when the dressing should be changed and how to change it.   Set up a follow-up appointment before leaving. Document Released: 04/29/2009 Document Revised: 01/20/2012 Document Reviewed: 10/21/2011 [facility] Patient Information 2014 [place], [place]. PATIENT INSTRUCTIONS POST-ANESTHESIA  IMMEDIATELY FOLLOWING SURGERY:  Do not drive or operate machinery for the first twenty four  hours after surgery.  Do not make any important decisions for twenty four hours after surgery or while taking narcotic pain medications or sedatives.  If you develop intractable nausea and vomiting or a severe headache please notify your doctor immediately.  FOLLOW-UP:  Please make an appointment with your surgeon as instructed. You do not need to follow up with anesthesia unless specifically instructed to do so.  WOUND CARE INSTRUCTIONS (if applicable):  Keep a dry clean dressing on the anesthesia/puncture wound site if there is drainage.  Once the wound has quit draining you may leave it open to air.  Generally you should leave the bandage intact for twenty four hours unless there is drainage.  If the epidural site drains for more than 36-48 hours please call the anesthesia department.  QUESTIONS?:  Please feel free to call your physician or the hospital operator if you have any questions, and they will be happy to assist you.

## 2013-03-23 ENCOUNTER — Encounter (HOSPITAL_COMMUNITY)
Admission: RE | Disposition: A | Discharge status: Home / Self Care | Payer: Self-pay | Source: Ambulatory Visit | Attending: Podiatry

## 2013-03-23 ENCOUNTER — Encounter (HOSPITAL_COMMUNITY): Payer: Managed Care, Other (non HMO) | Admitting: Anesthesiology

## 2013-03-23 ENCOUNTER — Ambulatory Visit (HOSPITAL_COMMUNITY): Payer: Managed Care, Other (non HMO) | Admitting: Anesthesiology

## 2013-03-23 ENCOUNTER — Ambulatory Visit (HOSPITAL_COMMUNITY)
Admission: RE | Admit: 2013-03-23 | Discharge: 2013-03-23 | Disposition: A | Discharge status: Home / Self Care | Payer: Managed Care, Other (non HMO) | Source: Ambulatory Visit | Attending: Podiatry | Admitting: Podiatry

## 2013-03-23 ENCOUNTER — Encounter (HOSPITAL_COMMUNITY): Payer: Self-pay | Admitting: Anesthesiology

## 2013-03-23 ENCOUNTER — Ambulatory Visit (HOSPITAL_COMMUNITY): Payer: Managed Care, Other (non HMO)

## 2013-03-23 DIAGNOSIS — S98139A Complete traumatic amputation of one unspecified lesser toe, initial encounter: Secondary | ICD-10-CM | POA: Insufficient documentation

## 2013-03-23 DIAGNOSIS — I10 Essential (primary) hypertension: Secondary | ICD-10-CM | POA: Insufficient documentation

## 2013-03-23 DIAGNOSIS — E1149 Type 2 diabetes mellitus with other diabetic neurological complication: Secondary | ICD-10-CM | POA: Insufficient documentation

## 2013-03-23 DIAGNOSIS — Z79899 Other long term (current) drug therapy: Secondary | ICD-10-CM | POA: Insufficient documentation

## 2013-03-23 DIAGNOSIS — F17200 Nicotine dependence, unspecified, uncomplicated: Secondary | ICD-10-CM | POA: Insufficient documentation

## 2013-03-23 DIAGNOSIS — Z7982 Long term (current) use of aspirin: Secondary | ICD-10-CM | POA: Insufficient documentation

## 2013-03-23 DIAGNOSIS — M869 Osteomyelitis, unspecified: Secondary | ICD-10-CM | POA: Insufficient documentation

## 2013-03-23 DIAGNOSIS — E1142 Type 2 diabetes mellitus with diabetic polyneuropathy: Secondary | ICD-10-CM | POA: Insufficient documentation

## 2013-03-23 DIAGNOSIS — Z01812 Encounter for preprocedural laboratory examination: Secondary | ICD-10-CM | POA: Insufficient documentation

## 2013-03-23 HISTORY — PX: INCISION AND DRAINAGE OF WOUND: SHX1803

## 2013-03-23 LAB — GLUCOSE, CAPILLARY
GLUCOSE-CAPILLARY: 134 mg/dL — AB (ref 70–99)
Glucose-Capillary: 156 mg/dL — ABNORMAL HIGH (ref 70–99)

## 2013-03-23 SURGERY — IRRIGATION AND DEBRIDEMENT WOUND
Anesthesia: Monitor Anesthesia Care | Laterality: Right

## 2013-03-23 MED ORDER — LIDOCAINE HCL (PF) 1 % IJ SOLN
INTRAMUSCULAR | Status: AC
  Filled 2013-03-23: qty 5

## 2013-03-23 MED ORDER — FENTANYL CITRATE 0.05 MG/ML IJ SOLN
25.0000 ug | INTRAMUSCULAR | Status: AC
  Administered 2013-03-23 (×2): 25 ug via INTRAVENOUS

## 2013-03-23 MED ORDER — FENTANYL CITRATE 0.05 MG/ML IJ SOLN
INTRAMUSCULAR | Status: AC
Start: 2013-03-23 — End: 2013-03-23
  Filled 2013-03-23: qty 2

## 2013-03-23 MED ORDER — MIDAZOLAM HCL 2 MG/2ML IJ SOLN
INTRAMUSCULAR | Status: AC
  Filled 2013-03-23: qty 2

## 2013-03-23 MED ORDER — SODIUM CHLORIDE 0.9 % IR SOLN
Status: DC | PRN
  Administered 2013-03-23: 1000 mL

## 2013-03-23 MED ORDER — MIDAZOLAM HCL 2 MG/2ML IJ SOLN
1.0000 mg | INTRAMUSCULAR | Status: DC | PRN
  Administered 2013-03-23: 2 mg via INTRAVENOUS

## 2013-03-23 MED ORDER — LACTATED RINGERS IV SOLN
INTRAVENOUS | Status: DC
  Administered 2013-03-23: 07:00:00 via INTRAVENOUS

## 2013-03-23 MED ORDER — MIDAZOLAM HCL 5 MG/5ML IJ SOLN
INTRAMUSCULAR | Status: DC | PRN
  Administered 2013-03-23: 2 mg via INTRAVENOUS

## 2013-03-23 MED ORDER — HYDROCODONE-ACETAMINOPHEN 7.5-325 MG PO TABS: 1.0000 | ORAL_TABLET | Freq: Four times a day (QID) | ORAL | Status: DC | PRN

## 2013-03-23 MED ORDER — PROPOFOL 10 MG/ML IV EMUL
INTRAVENOUS | Status: AC
  Filled 2013-03-23: qty 20

## 2013-03-23 MED ORDER — PROPOFOL INFUSION 10 MG/ML OPTIME
INTRAVENOUS | Status: DC | PRN
  Administered 2013-03-23: 08:00:00 via INTRAVENOUS
  Administered 2013-03-23: 75 ug/kg/min via INTRAVENOUS

## 2013-03-23 MED ORDER — VANCOMYCIN HCL 10 G IV SOLR: 2000.0000 mg | Freq: Once | INTRAVENOUS | Status: DC

## 2013-03-23 MED ORDER — FENTANYL CITRATE 0.05 MG/ML IJ SOLN
INTRAMUSCULAR | Status: AC
  Filled 2013-03-23: qty 2

## 2013-03-23 MED ORDER — FENTANYL CITRATE 0.05 MG/ML IJ SOLN: 25.0000 ug | INTRAMUSCULAR | Status: DC | PRN

## 2013-03-23 MED ORDER — LACTATED RINGERS IV SOLN
INTRAVENOUS | Status: DC | PRN
  Administered 2013-03-23: 07:00:00 via INTRAVENOUS

## 2013-03-23 MED ORDER — VANCOMYCIN HCL IN DEXTROSE 1-5 GM/200ML-% IV SOLN
INTRAVENOUS | Status: AC
  Filled 2013-03-23: qty 400

## 2013-03-23 MED ORDER — BUPIVACAINE HCL (PF) 0.5 % IJ SOLN
INTRAMUSCULAR | Status: AC
  Filled 2013-03-23: qty 30

## 2013-03-23 MED ORDER — PROPOFOL 10 MG/ML IV BOLUS
INTRAVENOUS | Status: AC
  Filled 2013-03-23: qty 20

## 2013-03-23 MED ORDER — SUCCINYLCHOLINE CHLORIDE 20 MG/ML IJ SOLN
INTRAMUSCULAR | Status: AC
  Filled 2013-03-23: qty 1

## 2013-03-23 MED ORDER — LIDOCAINE HCL (PF) 1 % IJ SOLN
INTRAMUSCULAR | Status: AC
Start: 2013-03-23 — End: 2013-03-23
  Filled 2013-03-23: qty 5

## 2013-03-23 MED ORDER — ONDANSETRON HCL 4 MG/2ML IJ SOLN
4.0000 mg | Freq: Once | INTRAMUSCULAR | Status: AC
  Administered 2013-03-23: 4 mg via INTRAVENOUS

## 2013-03-23 MED ORDER — BUPIVACAINE HCL (PF) 0.5 % IJ SOLN
INTRAMUSCULAR | Status: DC | PRN
  Administered 2013-03-23 (×2): 10 mL

## 2013-03-23 MED ORDER — ONDANSETRON HCL 4 MG/2ML IJ SOLN
INTRAMUSCULAR | Status: AC
  Filled 2013-03-23: qty 2

## 2013-03-23 MED ORDER — VANCOMYCIN HCL IN DEXTROSE 1-5 GM/200ML-% IV SOLN
1000.0000 mg | INTRAVENOUS | Status: AC
  Administered 2013-03-23 (×2): 1000 mg via INTRAVENOUS

## 2013-03-23 MED ORDER — FENTANYL CITRATE 0.05 MG/ML IJ SOLN
INTRAMUSCULAR | Status: DC | PRN
  Administered 2013-03-23: 50 ug via INTRAVENOUS
  Administered 2013-03-23 (×2): 25 ug via INTRAVENOUS

## 2013-03-23 MED ORDER — ONDANSETRON HCL 4 MG/2ML IJ SOLN: 4.0000 mg | Freq: Once | INTRAMUSCULAR | Status: DC | PRN

## 2013-03-23 SURGICAL SUPPLY — 35 items
BAG HAMPER (MISCELLANEOUS) ×3 IMPLANT
BANDAGE ELASTIC 4 VELCRO NS (GAUZE/BANDAGES/DRESSINGS) ×3 IMPLANT
BANDAGE ESMARK 4X12 BL STRL LF (DISPOSABLE) ×1 IMPLANT
BANDAGE GAUZE ELAST BULKY 4 IN (GAUZE/BANDAGES/DRESSINGS) ×3 IMPLANT
BLADE OSC/SAGITTAL MD 9X18.5 (BLADE) ×2 IMPLANT
BLADE SURG 15 STRL LF DISP TIS (BLADE) ×2 IMPLANT
BLADE SURG 15 STRL SS (BLADE) ×6
BNDG CMPR 12X4 ELC STRL LF (DISPOSABLE) ×1
BNDG ESMARK 4X12 BLUE STRL LF (DISPOSABLE) ×3
CLOTH BEACON ORANGE TIMEOUT ST (SAFETY) ×3 IMPLANT
COVER LIGHT HANDLE STERIS (MISCELLANEOUS) ×6 IMPLANT
CUFF TOURNIQUET SINGLE 18IN (TOURNIQUET CUFF) ×2 IMPLANT
DECANTER SPIKE VIAL GLASS SM (MISCELLANEOUS) ×3 IMPLANT
DRSG ADAPTIC 3X8 NADH LF (GAUZE/BANDAGES/DRESSINGS) ×2 IMPLANT
ELECT REM PT RETURN 9FT ADLT (ELECTROSURGICAL) ×3
ELECTRODE REM PT RTRN 9FT ADLT (ELECTROSURGICAL) ×1 IMPLANT
GLOVE BIO SURGEON STRL SZ7.5 (GLOVE) ×3 IMPLANT
GLOVE BIOGEL PI IND STRL 7.0 (GLOVE) IMPLANT
GLOVE BIOGEL PI INDICATOR 7.0 (GLOVE) ×4
GLOVE SS BIOGEL STRL SZ 6.5 (GLOVE) IMPLANT
GLOVE SUPERSENSE BIOGEL SZ 6.5 (GLOVE) ×2
GOWN STRL REUS W/TWL LRG LVL3 (GOWN DISPOSABLE) ×7 IMPLANT
KIT CLEAN CATCH URINE (SET/KITS/TRAYS/PACK) ×2 IMPLANT
KIT ROOM TURNOVER AP CYSTO (KITS) ×3 IMPLANT
MANIFOLD NEPTUNE II (INSTRUMENTS) ×3 IMPLANT
NDL HYPO 27GX1-1/4 (NEEDLE) ×3 IMPLANT
NEEDLE HYPO 27GX1-1/4 (NEEDLE) ×6 IMPLANT
NS IRRIG 1000ML POUR BTL (IV SOLUTION) ×3 IMPLANT
PACK BASIC LIMB (CUSTOM PROCEDURE TRAY) ×3 IMPLANT
PAD ARMBOARD 7.5X6 YLW CONV (MISCELLANEOUS) ×3 IMPLANT
SET BASIN LINEN APH (SET/KITS/TRAYS/PACK) ×3 IMPLANT
SPONGE GAUZE 4X4 12PLY (GAUZE/BANDAGES/DRESSINGS) ×3 IMPLANT
SUT PROLENE 4 0 PS 2 18 (SUTURE) ×4 IMPLANT
SYR BULB IRRIGATION 50ML (SYRINGE) ×3 IMPLANT
SYR CONTROL 10ML LL (SYRINGE) ×6 IMPLANT

## 2013-03-23 NOTE — Anesthesia Preprocedure Evaluation (Signed)
Anesthesia Evaluation  Patient identified by MRN, date of birth, ID band Patient awake    Reviewed: Allergy & Precautions, H&P , NPO status , Patient's Chart, lab work & pertinent test results, reviewed documented beta blocker date and time   History of Anesthesia Complications (+) PONV and history of anesthetic complications  Airway Mallampati: I TM Distance: >3 FB     Dental  (+) Edentulous Upper   Pulmonary Current Smoker,  breath sounds clear to auscultation        Cardiovascular hypertension, + CAD, + Past MI and + Cardiac Stents Rhythm:Regular Rate:Normal     Neuro/Psych PSYCHIATRIC DISORDERS Depression    GI/Hepatic negative GI ROS,   Endo/Other  diabetes, Well Controlled, Type 2, Oral Hypoglycemic Agents  Renal/GU      Musculoskeletal   Abdominal   Peds  Hematology   Anesthesia Other Findings   Reproductive/Obstetrics                           Anesthesia Physical Anesthesia Plan  ASA: III  Anesthesia Plan: MAC   Post-op Pain Management:    Induction: Intravenous  Airway Management Planned: Nasal Cannula  Additional Equipment:   Intra-op Plan:   Post-operative Plan:   Informed Consent: I have reviewed the patients History and Physical, chart, labs and discussed the procedure including the risks, benefits and alternatives for the proposed anesthesia with the patient or authorized representative who has indicated his/her understanding and acceptance.     Plan Discussed with:   Anesthesia Plan Comments:         Anesthesia Quick Evaluation

## 2013-03-23 NOTE — Discharge Instructions (Signed)

## 2013-03-23 NOTE — Transfer of Care (Signed)
Immediate Anesthesia Transfer of Care Note  Patient: Christy [name]  Procedure(s) Performed: Procedure(s): DEBRIDEMENT OF INFECTED BONE 5TH METATARSAL RIGHT FOOT (Right)  Patient Location: PACU  Anesthesia Type:MAC  Level of Consciousness: awake, alert , oriented and patient cooperative  Airway & Oxygen Therapy: Patient Spontanous Breathing and Patient connected to nasal cannula oxygen  Post-op Assessment: Report given to PACU RN and Post -op Vital signs reviewed and stable  Post vital signs: Reviewed and stable  Complications: No apparent anesthesia complications

## 2013-03-23 NOTE — Anesthesia Postprocedure Evaluation (Signed)
  Anesthesia Post-op Note  Patient: Sheela StackKathy S Hogenson  Procedure(s) Performed: Procedure(s): DEBRIDEMENT OF INFECTED BONE 5TH METATARSAL RIGHT FOOT (Right)  Patient Location: PACU  Anesthesia Type:MAC  Level of Consciousness: awake, alert , oriented and patient cooperative  Airway and Oxygen Therapy: Patient Spontanous Breathing and Patient connected to nasal cannula oxygen  Post-op Pain: mild  Post-op Assessment: Post-op Vital signs reviewed, Patient's Cardiovascular Status Stable, Respiratory Function Stable, Patent Airway, No signs of Nausea or vomiting and Pain level controlled  Post-op Vital Signs: Reviewed and stable  Complications: No apparent anesthesia complications

## 2013-03-23 NOTE — Preoperative (Signed)
Beta Blockers   Reason not to administer Beta Blockers:Not Applicable 

## 2013-03-23 NOTE — H&P (Signed)
HISTORY AND PHYSICAL INTERVAL NOTE:  03/23/2013  7:25 AM  Christy [name]  has presented today for surgery, with the diagnosis of osteomyelitis 5th metatarsal right foot.  The various methods of treatment have been discussed with the patient.  No guarantees were given.  After consideration of risks, benefits and other options for treatment, the patient has consented to surgery.  I have reviewed the patients' chart and labs.    Patient Vitals for the past 24 hrs:  BP Temp Temp src Pulse Resp SpO2  03/23/13 0715 101/55 mmHg - - - 14 94 %  03/23/13 0710 101/55 mmHg - - - 14 96 %  03/23/13 0705 - - - - 10 96 %  03/23/13 0700 - - - - 17 93 %  03/23/13 0659 110/65 mmHg - - - - -  03/23/13 0655 99/55 mmHg - - - 15 93 %  03/23/13 0650 99/55 mmHg - - - 23 94 %  03/23/13 0645 99/55 mmHg - - - 16 95 %  03/23/13 0640 99/55 mmHg - - - 21 95 %  03/23/13 0637 - 98.5 F (36.9 C) Oral 57 18 96 %  03/23/13 0635 - - - - 13 96 %    A history and physical examination was performed in my office.  The patient was reexamined.  There have been no changes to the physical examination.  She did not get her Levquin that was prescribed at her last visit.  Christy [name], DPM

## 2013-03-23 NOTE — Op Note (Signed)
OPERATIVE NOTE  DATE OF PROCEDURE:  03/23/2013  SURGEON:   Dallas SchimkeBenjamin Ivan Aleesa Sweigert, DPM  OR STAFF:   Circulator: Lennox Pippinsebra P Dallas, RN Scrub Person: Diana EvesWendy J Cain, CST Circulator Assistant: Horton MarshallKatie S Woods   PREOPERATIVE DIAGNOSIS:   1.  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 2.  Diabetes mellitus with peripheral neuropathy.  POSTOPERATIVE DIAGNOSIS: Same  PROCEDURE: Debridement of skin, subcutaneous tissue and infected bone, right foot.  ANESTHESIA:  Monitor Anesthesia Care   HEMOSTASIS:   Pneumatic ankle tourniquet set at 250 mmHg  ESTIMATED BLOOD LOSS:   Minimal (<5 cc)  MATERIALS USED:   None  INJECTABLES: Marcaine 0.5% plain; 20mL  PATHOLOGY:   1.  Soft tissue for culture and sensitivity 2.  5th metatarsal bone for pathology  COMPLICATIONS:   None  INDICATIONS:  This 53 year old female history of osteomyelitis of her fifth ray.  She has had 2 surgeries.  She was progressing well until Tuesday.  On Tuesday, she presented complaining of drainage and redness overlying the proximal aspect of the fifth metatarsal region of her right foot.  Radiographs revealed erosive changes of the distal portion of the residual fifth metatarsal bone.  Surgical debridement was recommended.  She was prescribed Levaquin in the office.  She has not had this prescription filled.  DESCRIPTION OF THE PROCEDURE:   The patient was brought to the operating room and placed on the operative table in the supine position.  A pneumatic ankle tourniquet was applied to the patient's ankle.  Following sedation, the surgical site was anesthetized with 0.5% Marcaine plain.  The foot was then prepped, scrubbed, and draped in the usual sterile technique.  The foot was elevated, exsanguinated and the pneumatic ankle tourniquet inflated to 250 mmHg.    Attention was directed to the dorsal lateral aspect of the right foot.  2 open wounds are present along the previous amputation site.  The proximal wound is  fibrotic with periwound erythema present.  The distal wound is granular.  There is a bridge intact skin.  A #15 blade was used to create 2 converging semi-elliptical incisions to excise the open areas.  The areas of skin were removed and passed from the operative field.  Dissection was continued deep down to the level of the fifth metatarsal bone.  Erosive changes were visible along the distal lateral aspect of the fifth metatarsal bone.  A small fragment of bone was present.  Soft tissue was divided free of fibrotic tissue.  Soft tissue specimen was sent to pathology for culture.    Using a power bone saw the distal portion of the residual fifth metatarsal bone was resected.  The bone was sent to pathology for evaluation.  The residual portion of the fifth metatarsal bone was evaluated.  The bone was firm and of normal color.  The was irrigated with copious amounts of sterile irrigant.  Skin was reapproximated using 4-0 Prolene in a horizontal mattress suture technique.  A sterile compressive dressing was applied to the right foot.  The pneumatic ankle tourniquet was deflated and a prompt hyperemic response was noted to all remaining digits of the right foot   The patient tolerated the procedure well.  The patient was then transferred to PACU with vital signs stable and vascular status intact to all toes of the operative foot.  Following a period of postoperative monitoring, the patient will be discharged home.

## 2013-03-26 LAB — TISSUE CULTURE: Gram Stain: NONE SEEN

## 2013-03-27 ENCOUNTER — Encounter (HOSPITAL_COMMUNITY): Payer: Self-pay | Admitting: Podiatry

## 2013-09-28 ENCOUNTER — Encounter: Payer: Self-pay | Admitting: Vascular Surgery

## 2013-09-28 ENCOUNTER — Other Ambulatory Visit: Payer: Self-pay | Admitting: Vascular Surgery

## 2013-09-28 DIAGNOSIS — I739 Peripheral vascular disease, unspecified: Secondary | ICD-10-CM

## 2013-09-28 DIAGNOSIS — L98499 Non-pressure chronic ulcer of skin of other sites with unspecified severity: Principal | ICD-10-CM

## 2013-10-04 ENCOUNTER — Encounter: Payer: Self-pay | Admitting: Vascular Surgery

## 2013-10-05 ENCOUNTER — Encounter: Payer: Self-pay | Admitting: Vascular Surgery

## 2013-10-05 ENCOUNTER — Other Ambulatory Visit: Payer: Self-pay

## 2013-10-05 ENCOUNTER — Ambulatory Visit (HOSPITAL_COMMUNITY)
Admission: RE | Admit: 2013-10-05 | Discharge: 2013-10-05 | Disposition: A | Discharge status: Home / Self Care | Payer: Managed Care, Other (non HMO) | Source: Ambulatory Visit | Attending: Vascular Surgery | Admitting: Vascular Surgery

## 2013-10-05 ENCOUNTER — Ambulatory Visit (INDEPENDENT_AMBULATORY_CARE_PROVIDER_SITE_OTHER): Payer: Managed Care, Other (non HMO) | Admitting: Vascular Surgery

## 2013-10-05 VITALS — BP 132/71 | HR 64 | Resp 16 | Ht 66.0 in | Wt 242.0 lb

## 2013-10-05 DIAGNOSIS — I739 Peripheral vascular disease, unspecified: Secondary | ICD-10-CM

## 2013-10-05 DIAGNOSIS — L98499 Non-pressure chronic ulcer of skin of other sites with unspecified severity: Secondary | ICD-10-CM | POA: Diagnosis present

## 2013-10-05 DIAGNOSIS — I70209 Unspecified atherosclerosis of native arteries of extremities, unspecified extremity: Secondary | ICD-10-CM | POA: Insufficient documentation

## 2013-10-05 DIAGNOSIS — L97509 Non-pressure chronic ulcer of other part of unspecified foot with unspecified severity: Secondary | ICD-10-CM

## 2013-10-05 DIAGNOSIS — L97519 Non-pressure chronic ulcer of other part of right foot with unspecified severity: Secondary | ICD-10-CM

## 2013-10-05 NOTE — Assessment & Plan Note (Signed)
This patient has a nonhealing wound of the right fourth toe. Given her evidence of significant infrainguinal arterial occlusive disease and her diabetes, clearly this is a limb threatening problem. We have had a long discussion about the importance of tobacco cessation. We discussed its relationship to the development of atherosclerosis but also discussed its affect on the microcirculation. I have recommended we proceed with arteriography in order to determine if she is a candidate for revascularization. I have reviewed with the patient the indications for arteriography. In addition, I have reviewed the potential complications of arteriography including but not limited to: Bleeding, arterial injury, arterial thrombosis, dye action, renal insufficiency, or other unpredictable medical problems. I have explained to the patient that if we find disease amenable to angioplasty we could potentially address this at the same time. I have discussed the potential complications of angioplasty and stenting, including but not limited to: Bleeding, arterial thrombosis, arterial injury, dissection, or the need for surgical intervention. I'll make further recommendations pending these results. Of note if she will is a candidate for bypass she would need preoperative vein mapping. She will be at increased risk for wound healing problems and infection given her obesity and diabetes. Her arteriogram is scheduled for a 2415.

## 2013-10-05 NOTE — Progress Notes (Signed)
 Patient ID: Christy [name], female   DOB: 09/16/1960, 53 y.o.   MRN: [id number]  Reason for Consult: Nonhealing wound of the right foot.   Referred by Dr. Harold Nichols  Subjective:     HPI:  Christy [name] is a 53 y.o. female who was referred by Dr. Nichols from the wound care center. She has undergone previous amputation of the right fifth toe by Dr.McKinney. She subsequently developed a wound on her adjacent fourth toe on the right foot. The patient is being treated with doxycycline for this infection. I have reviewed her records from the wound care center.  She did undergo a Doppler study there which showed an ABI of 40% on the right and 64% on the left. Toe pressure on the right with 23 mmHg and toe pressure on the left 47 mm of mercury.  She does describe bilateral lower extremity claudication. She denies any history of rest pain.  Her risk factors for peripheral vascular disease include diabetes, hypertension, hyperkalemia, and history of tobacco use. She currently smokes a pack per day of cigarettes and has been smoking since she was 53 years old.  Past Medical History  Diagnosis Date  . 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  . Tobacco abuse     60-90-pack-year history; 1/3 pack per day in 2013  . Hypertension   . Diabetes mellitus, type 2   . Depression 2012    Following sudden death of husband  . Metabolic syndrome     Elevated triglycerides, low HDL, fasting hyperglycemia with low-dose sulfonylurea  . Myocardial infarction 09/2010  . PONV (postoperative nausea and vomiting)    Family History  Problem Relation Age of Onset  . Heart disease Father   . Hyperlipidemia Father   . Hypertension Father   . Heart disease Mother   . Diabetes Mother   . Hyperlipidemia Mother   . Hypertension Mother   . Varicose Veins Sister   . Cancer Brother    Past Surgical History  Procedure Laterality Date  . Coronary  angioplasty with stent placement  10/01/10    DES x2  . Cholecystectomy    . Bilateral oophorectomy  2011    for benign mass  . Metatarsal head excision Right 11/03/2012    Procedure: AMPUTATION OF RIGHT FIFTH TOE AND PORTION OF FIFTH METATARSAL;  Surgeon: Benjamin Ivan McKinney, DPM;  Location: [facility];  Service: Orthopedics;  Laterality: Right;  . Incision and drainage of wound Right 11/24/2012    Procedure: DEBRIDEMENT WOUND FOOT;  Surgeon: Benjamin Ivan McKinney, DPM;  Location: [facility];  Service: Orthopedics;  Laterality: Right;  . Application of wound vac Right 11/24/2012    Procedure: APPLICATION OF WOUND VAC;  Surgeon: Benjamin Ivan McKinney, DPM;  Location: [facility];  Service: Orthopedics;  Laterality: Right;  . Toe amputation    . Incision and drainage of wound Right 03/23/2013    Procedure: DEBRIDEMENT OF INFECTED BONE 5TH METATARSAL RIGHT FOOT;  Surgeon: Benjamin Ivan McKinney, DPM;  Location: [facility];  Service: Orthopedics;  Laterality: Right;   Short Social History:  History  Substance Use Topics  . Smoking status: Current Every Day Smoker -- 0.50 packs/day for 30 years    Types: Cigarettes  . Smokeless tobacco: Never Used  . Alcohol Use: No   Allergies  Allergen Reactions  . Bactrim Diarrhea  . Isosorbide Mononitrate Other (See Comments)    Made patient   feel as though she was about to have another MI   Current Outpatient Prescriptions  Medication Sig Dispense Refill  . aspirin 81 MG tablet Take 81 mg by mouth daily.        . fish oil-omega-3 fatty acids 1000 MG capsule Take 3 g by mouth daily.       . glipiZIDE (GLUCOTROL) 5 MG tablet Take 5 mg by mouth daily.      . metFORMIN (GLUCOPHAGE) 500 MG tablet Take 500 mg by mouth 2 (two) times daily with a meal.      . metoprolol succinate (TOPROL-XL) 25 MG 24 hr tablet Take 25 mg by mouth daily.      . metoprolol tartrate (LOPRESSOR) 25 MG tablet Take 1 tablet (25 mg total) by mouth 2 (two) times daily.  180 tablet  3  . Misc  Natural Products (CYSTEX) LIQD Take 15 mLs by mouth daily.      . niacin 500 MG CR capsule Take 1 capsule (500 mg total) by mouth at bedtime.  30 capsule  6  . nitroGLYCERIN (NITROSTAT) 0.4 MG SL tablet Place 1 tablet (0.4 mg total) under the tongue every 5 (five) minutes as needed.  25 tablet  5  . ciprofloxacin (CIPRO) 500 MG tablet Take 500 mg by mouth 2 (two) times daily.      . HYDROcodone-acetaminophen (LORTAB) 7.5-325 MG per tablet Take 1 tablet by mouth every 6 (six) hours as needed for moderate pain.  30 tablet  0  . simvastatin (ZOCOR) 20 MG tablet Take 20 mg by mouth at bedtime.       No current facility-administered medications for this visit.   Review of Systems  Constitutional: Negative for chills and fever.  Eyes: Negative for loss of vision.  Respiratory: Negative for cough and wheezing.  Cardiovascular: Positive for claudication. Negative for chest pain, chest tightness, dyspnea with exertion, orthopnea and palpitations.  GI: Negative for blood in stool and vomiting.  GU: Negative for dysuria and hematuria.  Musculoskeletal: Negative for leg pain, joint pain and myalgias.  Skin: Negative for rash and wound.  Neurological: Positive for numbness. Negative for dizziness and speech difficulty.  Hematologic: Negative for bruises/bleeds easily. Psychiatric: Negative for depressed mood.        Objective:  Objective  Filed Vitals:   10/05/13 1423  BP: 132/71  Pulse: 64  Resp: 16  Height: 5' 6" (1.676 m)  Weight: 242 lb (109.77 kg)   Body mass index is 39.08 kg/(m^2).  Physical Exam  Constitutional: She is oriented to person, place, and time. She appears well-developed and well-nourished.  HENT:  Head: Normocephalic and atraumatic.  Neck: Neck supple. No JVD present. No thyromegaly present.  Cardiovascular: Normal rate, regular rhythm and normal heart sounds.  Exam reveals no friction rub.   No murmur heard. Pulses:      Femoral pulses are 2+ on the right side,  and 2+ on the left side.      Popliteal pulses are 0 on the right side, and 0 on the left side.       Dorsalis pedis pulses are 0 on the right side, and 0 on the left side.       Posterior tibial pulses are 0 on the right side, and 0 on the left side.  Pulmonary/Chest: Breath sounds normal. She has no wheezes. She has no rales.  Abdominal: Soft. Bowel sounds are normal. There is no tenderness.  I do not palpate an aneurysm.  Musculoskeletal:   Normal range of motion. She exhibits no edema.  Lymphadenopathy:    She has no cervical adenopathy.  Neurological: She is alert and oriented to person, place, and time. She has normal strength. No sensory deficit.  Skin: No lesion and no rash noted.  She has a previous right fifth toe amputation with some eschar laterally on her foot. There is an ulcer on her adjacent right fourth toe which appears to go to the joint space.  Psychiatric: She has a normal mood and affect.   Data: I have independently interpreted her duplex scan today which shows bilateral superficial femoral artery occlusions in addition to tibial artery occlusive disease.      Assessment/Plan:    Atherosclerotic PVD with ulceration This patient has a nonhealing wound of the right fourth toe. Given her evidence of significant infrainguinal arterial occlusive disease and her diabetes, clearly this is a limb threatening problem. We have had a long discussion about the importance of tobacco cessation. We discussed its relationship to the development of atherosclerosis but also discussed its affect on the microcirculation. I have recommended we proceed with arteriography in order to determine if she is a candidate for revascularization. I have reviewed with the patient the indications for arteriography. In addition, I have reviewed the potential complications of arteriography including but not limited to: Bleeding, arterial injury, arterial thrombosis, dye action, renal insufficiency, or other  unpredictable medical problems. I have explained to the patient that if we find disease amenable to angioplasty we could potentially address this at the same time. I have discussed the potential complications of angioplasty and stenting, including but not limited to: Bleeding, arterial thrombosis, arterial injury, dissection, or the need for surgical intervention. I'll make further recommendations pending these results. Of note if she will is a candidate for bypass she would need preoperative vein mapping. She will be at increased risk for wound healing problems and infection given her obesity and diabetes. Her arteriogram is scheduled for a 2415.   Karsten Vaughn S Zakiya Sporrer MD [facility]   

## 2013-10-06 ENCOUNTER — Encounter (HOSPITAL_COMMUNITY): Payer: Self-pay | Admitting: Pharmacy Technician

## 2013-10-08 MED ORDER — SODIUM CHLORIDE 0.9 % IV SOLN
INTRAVENOUS | Status: DC
  Administered 2013-10-09: 07:00:00 via INTRAVENOUS

## 2013-10-09 ENCOUNTER — Other Ambulatory Visit: Payer: Self-pay | Admitting: *Deleted

## 2013-10-09 ENCOUNTER — Encounter (HOSPITAL_COMMUNITY)
Admission: RE | Disposition: A | Discharge status: Home / Self Care | Payer: Managed Care, Other (non HMO) | Source: Ambulatory Visit | Attending: Vascular Surgery

## 2013-10-09 ENCOUNTER — Encounter: Payer: Managed Care, Other (non HMO) | Admitting: Surgery

## 2013-10-09 ENCOUNTER — Ambulatory Visit (HOSPITAL_COMMUNITY)
Admission: RE | Admit: 2013-10-09 | Discharge: 2013-10-09 | Disposition: A | Discharge status: Home / Self Care | Payer: Managed Care, Other (non HMO) | Source: Ambulatory Visit | Attending: Vascular Surgery | Admitting: Vascular Surgery

## 2013-10-09 DIAGNOSIS — S98139A Complete traumatic amputation of one unspecified lesser toe, initial encounter: Secondary | ICD-10-CM | POA: Diagnosis not present

## 2013-10-09 DIAGNOSIS — L98499 Non-pressure chronic ulcer of skin of other sites with unspecified severity: Principal | ICD-10-CM | POA: Insufficient documentation

## 2013-10-09 DIAGNOSIS — I739 Peripheral vascular disease, unspecified: Secondary | ICD-10-CM

## 2013-10-09 DIAGNOSIS — Z7982 Long term (current) use of aspirin: Secondary | ICD-10-CM | POA: Diagnosis not present

## 2013-10-09 DIAGNOSIS — E119 Type 2 diabetes mellitus without complications: Secondary | ICD-10-CM | POA: Diagnosis not present

## 2013-10-09 DIAGNOSIS — Z792 Long term (current) use of antibiotics: Secondary | ICD-10-CM | POA: Diagnosis not present

## 2013-10-09 DIAGNOSIS — F17200 Nicotine dependence, unspecified, uncomplicated: Secondary | ICD-10-CM | POA: Diagnosis not present

## 2013-10-09 DIAGNOSIS — I2510 Atherosclerotic heart disease of native coronary artery without angina pectoris: Secondary | ICD-10-CM | POA: Insufficient documentation

## 2013-10-09 DIAGNOSIS — E875 Hyperkalemia: Secondary | ICD-10-CM | POA: Insufficient documentation

## 2013-10-09 DIAGNOSIS — I252 Old myocardial infarction: Secondary | ICD-10-CM | POA: Diagnosis not present

## 2013-10-09 DIAGNOSIS — Z9861 Coronary angioplasty status: Secondary | ICD-10-CM | POA: Insufficient documentation

## 2013-10-09 DIAGNOSIS — L97509 Non-pressure chronic ulcer of other part of unspecified foot with unspecified severity: Secondary | ICD-10-CM | POA: Insufficient documentation

## 2013-10-09 DIAGNOSIS — Z01810 Encounter for preprocedural cardiovascular examination: Secondary | ICD-10-CM

## 2013-10-09 DIAGNOSIS — I10 Essential (primary) hypertension: Secondary | ICD-10-CM | POA: Diagnosis not present

## 2013-10-09 HISTORY — PX: ABDOMINAL AORTAGRAM: SHX5454

## 2013-10-09 LAB — POCT I-STAT, CHEM 8
BUN: 27 mg/dL — AB (ref 6–23)
CHLORIDE: 105 meq/L (ref 96–112)
Calcium, Ion: 1.21 mmol/L (ref 1.12–1.23)
Creatinine, Ser: 1.4 mg/dL — ABNORMAL HIGH (ref 0.50–1.10)
Glucose, Bld: 174 mg/dL — ABNORMAL HIGH (ref 70–99)
HCT: 46 % (ref 36.0–46.0)
Hemoglobin: 15.6 g/dL — ABNORMAL HIGH (ref 12.0–15.0)
POTASSIUM: 4.9 meq/L (ref 3.7–5.3)
SODIUM: 136 meq/L — AB (ref 137–147)
TCO2: 24 mmol/L (ref 0–100)

## 2013-10-09 LAB — GLUCOSE, CAPILLARY: GLUCOSE-CAPILLARY: 125 mg/dL — AB (ref 70–99)

## 2013-10-09 SURGERY — ABDOMINAL AORTAGRAM
Anesthesia: LOCAL

## 2013-10-09 MED ORDER — SODIUM CHLORIDE 0.9 % IV SOLN: 1.0000 mL/kg/h | INTRAVENOUS | Status: DC

## 2013-10-09 MED ORDER — MIDAZOLAM HCL 2 MG/2ML IJ SOLN
INTRAMUSCULAR | Status: AC
  Filled 2013-10-09: qty 2

## 2013-10-09 MED ORDER — OXYCODONE-ACETAMINOPHEN 5-325 MG PO TABS: 1.0000 | ORAL_TABLET | ORAL | Status: DC | PRN

## 2013-10-09 MED ORDER — FENTANYL CITRATE 0.05 MG/ML IJ SOLN
INTRAMUSCULAR | Status: AC
  Filled 2013-10-09: qty 2

## 2013-10-09 MED ORDER — LIDOCAINE HCL (PF) 1 % IJ SOLN
INTRAMUSCULAR | Status: AC
  Filled 2013-10-09: qty 30

## 2013-10-09 MED ORDER — HEPARIN (PORCINE) IN NACL 2-0.9 UNIT/ML-% IJ SOLN
INTRAMUSCULAR | Status: AC
  Filled 2013-10-09: qty 1000

## 2013-10-09 NOTE — Discharge Instructions (Signed)
NO METFORMIN FOR 2 DAYS ° ° ° ° ° °Angiogram, Care After °Refer to this sheet in the next few weeks. These instructions provide you with information on caring for yourself after your procedure. Your health care provider may also give you more specific instructions. Your treatment has been planned according to current medical practices, but problems sometimes occur. Call your health care provider if you have any problems or questions after your procedure.  °WHAT TO EXPECT AFTER THE PROCEDURE °After your procedure, it is typical to have the following sensations: °· Minor discomfort or tenderness and a small bump at the catheter insertion site. The bump should usually decrease in size and tenderness within 1 to 2 weeks. °· Any bruising will usually fade within 2 to 4 weeks. °HOME CARE INSTRUCTIONS  °· You may need to keep taking blood thinners if they were prescribed for you. Take medicines only as directed by your health care provider. °· Do not apply powder or lotion to the site. °· Do not take baths, swim, or use a hot tub until your health care provider approves. °· You may shower 24 hours after the procedure. Remove the bandage (dressing) and gently wash the site with plain soap and water. Gently pat the site dry. °· Inspect the site at least twice daily. °· Limit your activity for the first 48 hours. Do not bend, squat, or lift anything over 20 lb (9 kg) or as directed by your health care provider. °· Plan to have someone take you home after the procedure. Follow instructions about when you can drive or return to work. °SEEK MEDICAL CARE IF: °· You get light-headed when standing up. °· You have drainage (other than a small amount of blood on the dressing). °· You have chills. °· You have a fever. °· You have redness, warmth, swelling, or pain at the insertion site. °SEEK IMMEDIATE MEDICAL CARE IF:  °· You develop chest pain or shortness of breath, feel faint, or pass out. °· You have bleeding, swelling larger  than a walnut, or drainage from the catheter insertion site. °· You develop pain, discoloration, coldness, or severe bruising in the leg or arm that held the catheter. °· You develop bleeding from any other place, such as the bowels. You may see bright red blood in your urine or stools, or your stools may appear black and tarry. °· You have heavy bleeding from the site. If this happens, hold pressure on the site. °MAKE SURE YOU: °· Understand these instructions. °· Will watch your condition. °· Will get help right away if you are not doing well or get worse. °Document Released: 08/21/2004 Document Revised: 06/19/2013 Document Reviewed: 06/27/2012 °[employer]® Patient Information ©2015 [employer]. This information is not intended to replace advice given to you by your health care provider. Make sure you discuss any questions you have with your health care provider. ° °

## 2013-10-09 NOTE — Interval H&P Note (Signed)
History and Physical Interval Note:  10/09/2013 8:33 AM  Christy [name]  has presented today for surgery, with the diagnosis of PVD  The various methods of treatment have been discussed with the patient and family. After consideration of risks, benefits and other options for treatment, the patient has consented to  Procedure(s): ABDOMINAL AORTAGRAM (N/A) as a surgical intervention .  The patient's history has been reviewed, patient examined, no change in status, stable for surgery.  I have reviewed the patient's chart and labs.  Questions were answered to the patient's satisfaction.     [name] S

## 2013-10-09 NOTE — Op Note (Signed)
   PATIENT: Christy [name]   MRN: [id number] DOB: March 25, 1960    DATE OF PROCEDURE: 10/09/2013  INDICATIONS: Christy [name] is a 53 y.o. female who presented with a nonhealing wound on her right foot. She has evidence of infrainguinal arterial occlusive disease. Her BMI is 39. She has diabetes. Doppler study showed an ABI 40% on the right with a toe pressure of 23 mmHg. ABI on the left was 64% with a toe pressure of 47 mm mercury.  PROCEDURE:  1. Ultrasound-guided access to the right common femoral artery 2. Aortogram with bilateral iliac arteriogram and bilateral lower extremity runoff  SURGEON: Di Kindle. Edilia Bo, MD, FACS  ANESTHESIA: local with sedation   EBL: minimal  TECHNIQUE: the patient was taken to the peripheral vascular lab and sedated with 1 mg of Versed and 50 mcg of fentanyl. Both groins are prepped and draped in usual sterile fashion. After the skin was anesthetized, under ultrasound guidance, the right common femoral artery was cannulated with a micropuncture needle and a micropuncture sheath introduced over a wire. This was exchanged for a 5 [place] sheath over a [employer] wire. A pigtail catheter was positioned at the L1 vertebral body and flush aortogram obtained. The catheter was in position above the aortic bifurcation and oblique iliac projection was obtained. Bilateral lower extremity runoff films were then obtained. I then removed the pigtail catheter over a wire and a retrograde right femoral arteriogram was obtained by spot film of the right leg. At the completion of the procedure, the patient was transferred back to short stay for removal of the sheath.  FINDINGS:  1. There are single renal arteries bilaterally with no significant renal artery stenosis identified. 2. The infrarenal aorta is widely patent. Bilateral common iliac, external iliac, and hypogastric arteries are patent 3. On the right side, which is the symptomatic side, the superficial femoral artery is  occluded proximally. The common femoral and deep femoral artery are patent. There is reconstitution of the popliteal artery at the level of the knee. There is two-vessel runoff on the left via the anterior tibial and peroneal arteries. 4. On the left side, the common femoral and deep femoral arteries are patent. The superficial femoral artery is occluded proximally. There is reconstitution of the above-knee pop 2 artery. There is two-vessel runoff on the left via the posterior tibial and peroneal arteries. The anterior tibial artery is patent proximally but occludes in the proximal calf.  CLINICAL NOTE: The patient will be scheduled for right femoral to below knee popliteal artery bypass graft. She will come into the office for vein mapping and discussion of the procedure.  Waverly Ferrari, MD, FACS [facility]  DATE OF DICTATION:   10/09/2013

## 2013-10-09 NOTE — Progress Notes (Signed)
Site area: right groin Site Prior to Removal:  Level 0 Pressure Applied For: 20 minutes Manual:   yes Patient Status During Pull:  stable Post Pull Site:  Level 0 Post Pull Instructions Given:  yes  Post Pull Pulses Present: yes and remained doppled during sheath pull Dressing Applied:  tegaderm Bedrest begins @ 1005 Comments:no complications

## 2013-10-09 NOTE — H&P (View-Only) (Signed)
Patient ID: Christy [name], female   DOB: [birth year], 53 y.o.   MRN: [id number]  Reason for Consult: Nonhealing wound of the right foot.   Referred by Dr. Mills Koller  Subjective:     HPI:  Christy [name] is a 53 y.o. female who was referred by Dr. Tanda Rockers from the wound care center. She has undergone previous amputation of the right fifth toe by Dr.McKinney. She subsequently developed a wound on her adjacent fourth toe on the right foot. The patient is being treated with doxycycline for this infection. I have reviewed her records from the wound care center.  She did undergo a Doppler study there which showed an ABI of 40% on the right and 64% on the left. Toe pressure on the right with 23 mmHg and toe pressure on the left 47 mm of mercury.  She does describe bilateral lower extremity claudication. She denies any history of rest pain.  Her risk factors for peripheral vascular disease include diabetes, hypertension, hyperkalemia, and history of tobacco use. She currently smokes a pack per day of cigarettes and has been smoking since she was 53 years old.  Past Medical History  Diagnosis Date  . 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  . Tobacco abuse     60-90-pack-year history; 1/3 pack per day in Jun 22, 2011  . Hypertension   . Diabetes mellitus, type 2   . Depression 2010-06-22    Following sudden death of husband  . Metabolic syndrome     Elevated triglycerides, low HDL, fasting hyperglycemia with low-dose sulfonylurea  . Myocardial infarction 09/2010  . PONV (postoperative nausea and vomiting)    Family History  Problem Relation Age of Onset  . Heart disease Father   . Hyperlipidemia Father   . Hypertension Father   . Heart disease Mother   . Diabetes Mother   . Hyperlipidemia Mother   . Hypertension Mother   . Varicose Veins Sister   . Cancer Brother    Past Surgical History  Procedure Laterality Date  . Coronary  angioplasty with stent placement  10/01/10    DES x2  . Cholecystectomy    . Bilateral oophorectomy  06-21-09    for benign mass  . Metatarsal head excision Right 11/03/2012    Procedure: AMPUTATION OF RIGHT FIFTH TOE AND PORTION OF FIFTH METATARSAL;  Surgeon: Dallas Schimke, DPM;  Location: [facility];  Service: Orthopedics;  Laterality: Right;  . Incision and drainage of wound Right 11/24/2012    Procedure: DEBRIDEMENT WOUND FOOT;  Surgeon: Dallas Schimke, DPM;  Location: [facility];  Service: Orthopedics;  Laterality: Right;  . Application of wound vac Right 11/24/2012    Procedure: APPLICATION OF WOUND VAC;  Surgeon: Dallas Schimke, DPM;  Location: [facility];  Service: Orthopedics;  Laterality: Right;  . Toe amputation    . Incision and drainage of wound Right 03/23/2013    Procedure: DEBRIDEMENT OF INFECTED BONE 5TH METATARSAL RIGHT FOOT;  Surgeon: Dallas Schimke, DPM;  Location: [facility];  Service: Orthopedics;  Laterality: Right;   Short Social History:  History  Substance Use Topics  . Smoking status: Current Every Day Smoker -- 0.50 packs/day for 30 years    Types: Cigarettes  . Smokeless tobacco: Never Used  . Alcohol Use: No   Allergies  Allergen Reactions  . Bactrim Diarrhea  . Isosorbide Mononitrate Other (See Comments)    Made patient  feel as though she was about to have another MI   Current Outpatient Prescriptions  Medication Sig Dispense Refill  . aspirin 81 MG tablet Take 81 mg by mouth daily.        . fish oil-omega-3 fatty acids 1000 MG capsule Take 3 g by mouth daily.       Marland Kitchen glipiZIDE (GLUCOTROL) 5 MG tablet Take 5 mg by mouth daily.      . metFORMIN (GLUCOPHAGE) 500 MG tablet Take 500 mg by mouth 2 (two) times daily with a meal.      . metoprolol succinate (TOPROL-XL) 25 MG 24 hr tablet Take 25 mg by mouth daily.      . metoprolol tartrate (LOPRESSOR) 25 MG tablet Take 1 tablet (25 mg total) by mouth 2 (two) times daily.  180 tablet  3  . Misc  Natural Products (CYSTEX) LIQD Take 15 mLs by mouth daily.      . niacin 500 MG CR capsule Take 1 capsule (500 mg total) by mouth at bedtime.  30 capsule  6  . nitroGLYCERIN (NITROSTAT) 0.4 MG SL tablet Place 1 tablet (0.4 mg total) under the tongue every 5 (five) minutes as needed.  25 tablet  5  . ciprofloxacin (CIPRO) 500 MG tablet Take 500 mg by mouth 2 (two) times daily.      Marland Kitchen HYDROcodone-acetaminophen (LORTAB) 7.5-325 MG per tablet Take 1 tablet by mouth every 6 (six) hours as needed for moderate pain.  30 tablet  0  . simvastatin (ZOCOR) 20 MG tablet Take 20 mg by mouth at bedtime.       No current facility-administered medications for this visit.   Review of Systems  Constitutional: Negative for chills and fever.  Eyes: Negative for loss of vision.  Respiratory: Negative for cough and wheezing.  Cardiovascular: Positive for claudication. Negative for chest pain, chest tightness, dyspnea with exertion, orthopnea and palpitations.  GI: Negative for blood in stool and vomiting.  GU: Negative for dysuria and hematuria.  Musculoskeletal: Negative for leg pain, joint pain and myalgias.  Skin: Negative for rash and wound.  Neurological: Positive for numbness. Negative for dizziness and speech difficulty.  Hematologic: Negative for bruises/bleeds easily. Psychiatric: Negative for depressed mood.        Objective:  Objective  Filed Vitals:   10/05/13 1423  BP: 132/71  Pulse: 64  Resp: 16  Height:  (1.676 m)  Weight: 242 lb (109.77 kg)   Body mass index is 39.08 kg/(m^2).  Physical Exam  Constitutional: She is oriented to person, place, and time. She appears well-developed and well-nourished.  HENT:  Head: Normocephalic and atraumatic.  Neck: Neck supple. No JVD present. No thyromegaly present.  Cardiovascular: Normal rate, regular rhythm and normal heart sounds.  Exam reveals no friction rub.   No murmur heard. Pulses:      Femoral pulses are 2+ on the right side,  and 2+ on the left side.      Popliteal pulses are 0 on the right side, and 0 on the left side.       Dorsalis pedis pulses are 0 on the right side, and 0 on the left side.       Posterior tibial pulses are 0 on the right side, and 0 on the left side.  Pulmonary/Chest: Breath sounds normal. She has no wheezes. She has no rales.  Abdominal: Soft. Bowel sounds are normal. There is no tenderness.  I do not palpate an aneurysm.  Musculoskeletal:  Normal range of motion. She exhibits no edema.  Lymphadenopathy:    She has no cervical adenopathy.  Neurological: She is alert and oriented to person, place, and time. She has normal strength. No sensory deficit.  Skin: No lesion and no rash noted.  She has a previous right fifth toe amputation with some eschar laterally on her foot. There is an ulcer on her adjacent right fourth toe which appears to go to the joint space.  Psychiatric: She has a normal mood and affect.   Data: I have independently interpreted her duplex scan today which shows bilateral superficial femoral artery occlusions in addition to tibial artery occlusive disease.      Assessment/Plan:    Atherosclerotic PVD with ulceration This patient has a nonhealing wound of the right fourth toe. Given her evidence of significant infrainguinal arterial occlusive disease and her diabetes, clearly this is a limb threatening problem. We have had a long discussion about the importance of tobacco cessation. We discussed its relationship to the development of atherosclerosis but also discussed its affect on the microcirculation. I have recommended we proceed with arteriography in order to determine if she is a candidate for revascularization. I have reviewed with the patient the indications for arteriography. In addition, I have reviewed the potential complications of arteriography including but not limited to: Bleeding, arterial injury, arterial thrombosis, dye action, renal insufficiency, or other  unpredictable medical problems. I have explained to the patient that if we find disease amenable to angioplasty we could potentially address this at the same time. I have discussed the potential complications of angioplasty and stenting, including but not limited to: Bleeding, arterial thrombosis, arterial injury, dissection, or the need for surgical intervention. I'll make further recommendations pending these results. Of note if she will is a candidate for bypass she would need preoperative vein mapping. She will be at increased risk for wound healing problems and infection given her obesity and diabetes. Her arteriogram is scheduled for a 2415.   Christy Hint MD [facility]

## 2013-10-10 ENCOUNTER — Other Ambulatory Visit: Payer: Self-pay

## 2013-10-10 ENCOUNTER — Encounter: Payer: Self-pay | Admitting: Vascular Surgery

## 2013-10-10 ENCOUNTER — Telehealth: Payer: Self-pay | Admitting: Vascular Surgery

## 2013-10-10 NOTE — Telephone Encounter (Addendum)
Message copied by Rosalyn Charters on Tue Oct 10, 2013  9:40 AM ------      Message from: Lorin Mercy K      Created: Mon Oct 09, 2013  9:39 AM      Regarding: Schedule                    ----- Message -----         From: Chuck Hint, MD         Sent: 10/09/2013   9:30 AM           To: [facility] Charge Pool      Subject: charge and schedule                                      PROCEDURE:       1. Ultrasound-guided access to the right common femoral artery      2. Aortogram with bilateral iliac arteriogram and bilateral lower extremity runoff            SURGEON: Di Kindle. Edilia Bo, MD, FACS            Maurine Cane please schedule her for a right femoral to below knee popliteal artery bypass graft with vein on September 3 which is a Thursday. She will need to be seen on a Wednesday prior to that for vein mapping of her right greater saphenous vein and an office visit to discuss surgery. Thank you. CD ------  notified patient of ov and vein mapping at 8:30 and 9:30 with [name]

## 2013-10-11 ENCOUNTER — Other Ambulatory Visit: Payer: Self-pay

## 2013-10-11 ENCOUNTER — Ambulatory Visit (HOSPITAL_COMMUNITY)
Admission: RE | Admit: 2013-10-11 | Discharge: 2013-10-11 | Disposition: A | Discharge status: Home / Self Care | Payer: Managed Care, Other (non HMO) | Source: Ambulatory Visit | Attending: Vascular Surgery | Admitting: Vascular Surgery

## 2013-10-11 ENCOUNTER — Ambulatory Visit (INDEPENDENT_AMBULATORY_CARE_PROVIDER_SITE_OTHER): Payer: Managed Care, Other (non HMO) | Admitting: Vascular Surgery

## 2013-10-11 ENCOUNTER — Encounter: Payer: Self-pay | Admitting: Vascular Surgery

## 2013-10-11 VITALS — BP 130/68 | HR 64 | Ht 65.0 in | Wt 244.3 lb

## 2013-10-11 DIAGNOSIS — Z01810 Encounter for preprocedural cardiovascular examination: Secondary | ICD-10-CM | POA: Diagnosis present

## 2013-10-11 DIAGNOSIS — I739 Peripheral vascular disease, unspecified: Secondary | ICD-10-CM

## 2013-10-11 DIAGNOSIS — I70209 Unspecified atherosclerosis of native arteries of extremities, unspecified extremity: Secondary | ICD-10-CM

## 2013-10-11 DIAGNOSIS — L98499 Non-pressure chronic ulcer of skin of other sites with unspecified severity: Secondary | ICD-10-CM | POA: Insufficient documentation

## 2013-10-11 NOTE — Assessment & Plan Note (Signed)
This patient has a limb threatening situation in the right lower extremity. She has a nonhealing wound with significant multilevel infrainguinal arterial occlusive disease. Given the long segment occlusion of the superficial femoral artery, she is not a candidate for an endovascular approach. Her only option for revascularization is a right femoral to below knee popliteal artery bypass. I have reviewed the indications for lower extremity bypass. I have also reviewed the potential complications of surgery including but not limited to: wound healing problems, infection, graft thrombosis, limb loss, or other unpredictable medical problems. All the patient's questions were answered and they are agreeable to proceed. Given her morbid obesity and diabetes, certainly she is at increased risk for wound healing problems and infection. I've also discussed the option of proceeding with toe amputation given that the wound appears to extend to the joint space. She would like to proceed with a right fourth toe amputation. Her surgery is scheduled for 10/19/2013.

## 2013-10-11 NOTE — Progress Notes (Signed)
  [facility] [place]  Patient name: Christy [name] MRN: [id number] DOB: 06/20/1960 Sex: female  REASON FOR ADMISSION: Infrainguinal arterial occlusive disease on the right with a nonhealing wound of the right fourth toe.  HPI: Christy [name] is a 53 y.o. female who is admitted for elective right femoral to below knee popliteal artery bypass grafting and amputation of the right fourth toe. I saw her in consultation on 10/05/2013. She had been referred by Dr. Harold Nichols from the wound care center. She had undergone previous amputation of the right fifth toe by Dr. McKinney. She subsequently developed a wound on her adjacent fourth toe which has been nonhealing. She has been treated with doxycycline. Her workup included a Doppler study which showed an ABI of 40% on the right and 64% on the left. Toe pressure on the right with 23 mm of mercury and toe pressure on the left was 47 mm of mercury.  She does describe bilateral lower extremity claudication. She denies any history of rest pain.  Her risk factors for peripheral vascular disease include diabetes, hypertension, hypercholesterolemia, and tobacco use. She has smoked a pack per day of cigarettes and she was 53 years old. We have had multiple discussions about the importance of tobacco cessation since her original visit.  Past Medical History  Diagnosis Date  . 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  . Tobacco abuse     60-90-pack-year history; 1/3 pack per day in 2013  . Hypertension   . Diabetes mellitus, type 2   . Depression 2012    Following sudden death of husband  . Metabolic syndrome     Elevated triglycerides, low HDL, fasting hyperglycemia with low-dose sulfonylurea  . Myocardial infarction 09/2010  . PONV (postoperative nausea and vomiting)    Family History  Problem Relation Age of Onset  . Heart disease Father   . Hyperlipidemia Father     . Hypertension Father   . Heart disease Mother   . Diabetes Mother   . Hyperlipidemia Mother   . Hypertension Mother   . Varicose Veins Sister   . Cancer Brother    SOCIAL HISTORY: History  Substance Use Topics  . Smoking status: Current Every Day Smoker -- 0.50 packs/day for 30 years    Types: Cigarettes  . Smokeless tobacco: Never Used  . Alcohol Use: No   Allergies  Allergen Reactions  . Bactrim Diarrhea  . Isosorbide Mononitrate Other (See Comments)    Made patient feel as though she was about to have another MI   Current Outpatient Prescriptions  Medication Sig Dispense Refill  . aspirin EC 81 MG tablet Take 81 mg by mouth daily.      . ciprofloxacin (CIPRO) 500 MG tablet Take 500 mg by mouth 2 (two) times daily.      . fish oil-omega-3 fatty acids 1000 MG capsule Take 3 g by mouth daily.       . glipiZIDE (GLUCOTROL) 5 MG tablet Take 5 mg by mouth daily.      . metFORMIN (GLUCOPHAGE) 500 MG tablet Take 500 mg by mouth 2 (two) times daily with a meal.      . metoprolol tartrate (LOPRESSOR) 25 MG tablet Take 1 tablet (25 mg total) by mouth 2 (two) times daily.  180 tablet  3  . niacin 500 MG CR capsule Take 1 capsule (500 mg total) by mouth at   bedtime.  30 capsule  6  . nitroGLYCERIN (NITROSTAT) 0.4 MG SL tablet Place 1 tablet (0.4 mg total) under the tongue every 5 (five) minutes as needed.  25 tablet  5   No current facility-administered medications for this visit.   REVIEW OF SYSTEMS: [X ] denotes positive finding; [  ] denotes negative finding  CARDIOVASCULAR:  [ ] chest pain   [ ] chest pressure   [ ] palpitations   [ ] orthopnea   [ ] dyspnea on exertion   [X ] claudication   [ ] rest pain   [ ] DVT   [ ] phlebitis PULMONARY:   [ ] productive cough   [ ] asthma   [ ] wheezing NEUROLOGIC:   [ ] weakness  [X ] paresthesias  [ ] aphasia  [ ] amaurosis  [ ] dizziness HEMATOLOGIC:   [ ] bleeding problems   [ ] clotting disorders MUSCULOSKELETAL:  [ ] joint pain   [ ]  joint swelling [ ] leg swelling GASTROINTESTINAL: [ ]  blood in stool  [ ]  hematemesis GENITOURINARY:  [ ]  dysuria  [ ]  hematuria PSYCHIATRIC:  [ ] history of major depression INTEGUMENTARY:  [ ] rashes  [ ] ulcers CONSTITUTIONAL:  [ ] fever   [ ] chills  PHYSICAL EXAM: Filed Vitals:   10/11/13 0940  BP: 130/68  Pulse: 64  Height: 5' 5" (1.651 m)  Weight: 244 lb 4.8 oz (110.814 kg)  SpO2: 98%   Body mass index is 40.65 kg/(m^2). GENERAL: The patient is a morbidly obese  female, in no acute distress. The vital signs are documented above. CARDIOVASCULAR: There is a regular rate and rhythm. I do not detect carotid bruits. She has a palpable femoral pulse bilaterally. I cannot palpate popliteal or pedal pulses on either side. PULMONARY: There is good air exchange bilaterally without wheezing or rales. ABDOMEN: Soft and non-tender with normal pitched bowel sounds.  MUSCULOSKELETAL: she has had a previous right fifth toe amputation which has some eschar laterally on the foot. There is an ulcer on her adjacent right fourth toe which appears to go to the joint space. NEUROLOGIC: No focal weakness or paresthesias are detected. SKIN: There are no ulcers or rashes noted. PSYCHIATRIC: The patient has a normal affect.  DATA:  ARTERIOGRAPHY: She has no significant aortoiliac occlusive disease. On the right side, she has a long segment occlusion of the superficial femoral artery. There is reconstitution of the popliteal artery with two-vessel runoff via the anterior tibial and peroneal arteries.  I have independently interpreted her vein mapping. The right greater saphenous vein has diameters ranging from 0.35 cm-0.58 cm.  MEDICAL ISSUES: Atherosclerotic PVD with ulceration This patient has a limb threatening situation in the right lower extremity. She has a nonhealing wound with significant multilevel infrainguinal arterial occlusive disease. Given the long segment occlusion of the superficial  femoral artery, she is not a candidate for an endovascular approach. Her only option for revascularization is a right femoral to below knee popliteal artery bypass. I have reviewed the indications for lower extremity bypass. I have also reviewed the potential complications of surgery including but not limited to: wound healing problems, infection, graft thrombosis, limb loss, or other unpredictable medical problems. All the patient's questions were answered and they are agreeable to proceed. Given her morbid obesity and diabetes, certainly she is at increased risk for wound healing problems and infection. I've also discussed the option of   proceeding with toe amputation given that the wound appears to extend to the joint space. She would like to proceed with a right fourth toe amputation. Her surgery is scheduled for 10/19/2013.   VQI DATA: The patient is on aspirin but does not tolerate statins.  Zerenity Bowron S [facility] [place] Beeper: [phone]    

## 2013-10-13 ENCOUNTER — Encounter (HOSPITAL_COMMUNITY)
Admission: RE | Admit: 2013-10-13 | Discharge: 2013-10-13 | Disposition: A | Discharge status: Home / Self Care | Payer: Managed Care, Other (non HMO) | Source: Ambulatory Visit | Attending: Vascular Surgery | Admitting: Vascular Surgery

## 2013-10-13 ENCOUNTER — Encounter (HOSPITAL_COMMUNITY)
Admission: RE | Admit: 2013-10-13 | Discharge: 2013-10-13 | Disposition: A | Discharge status: Home / Self Care | Payer: Managed Care, Other (non HMO) | Source: Ambulatory Visit | Attending: Anesthesiology | Admitting: Anesthesiology

## 2013-10-13 ENCOUNTER — Encounter (HOSPITAL_COMMUNITY): Payer: Self-pay

## 2013-10-13 DIAGNOSIS — Z01818 Encounter for other preprocedural examination: Secondary | ICD-10-CM | POA: Insufficient documentation

## 2013-10-13 DIAGNOSIS — I739 Peripheral vascular disease, unspecified: Secondary | ICD-10-CM | POA: Insufficient documentation

## 2013-10-13 DIAGNOSIS — L98499 Non-pressure chronic ulcer of skin of other sites with unspecified severity: Secondary | ICD-10-CM | POA: Insufficient documentation

## 2013-10-13 HISTORY — DX: Unspecified osteoarthritis, unspecified site: M19.90

## 2013-10-13 HISTORY — DX: Chronic kidney disease, unspecified: N18.9

## 2013-10-13 LAB — URINALYSIS, ROUTINE W REFLEX MICROSCOPIC
BILIRUBIN URINE: NEGATIVE
Glucose, UA: NEGATIVE mg/dL
Ketones, ur: NEGATIVE mg/dL
Leukocytes, UA: NEGATIVE
Nitrite: NEGATIVE
PH: 5 (ref 5.0–8.0)
Protein, ur: NEGATIVE mg/dL
Specific Gravity, Urine: 1.02 (ref 1.005–1.030)
Urobilinogen, UA: 0.2 mg/dL (ref 0.0–1.0)

## 2013-10-13 LAB — CBC
HEMATOCRIT: 40.7 % (ref 36.0–46.0)
Hemoglobin: 14 g/dL (ref 12.0–15.0)
MCH: 32.9 pg (ref 26.0–34.0)
MCHC: 34.4 g/dL (ref 30.0–36.0)
MCV: 95.5 fL (ref 78.0–100.0)
PLATELETS: 223 10*3/uL (ref 150–400)
RBC: 4.26 MIL/uL (ref 3.87–5.11)
RDW: 13.7 % (ref 11.5–15.5)
WBC: 7.9 10*3/uL (ref 4.0–10.5)

## 2013-10-13 LAB — TYPE AND SCREEN
ABO/RH(D): A POS
ANTIBODY SCREEN: NEGATIVE

## 2013-10-13 LAB — COMPREHENSIVE METABOLIC PANEL
ALBUMIN: 3.2 g/dL — AB (ref 3.5–5.2)
ALT: 19 U/L (ref 0–35)
AST: 19 U/L (ref 0–37)
Alkaline Phosphatase: 75 U/L (ref 39–117)
Anion gap: 14 (ref 5–15)
BUN: 16 mg/dL (ref 6–23)
CALCIUM: 9.2 mg/dL (ref 8.4–10.5)
CO2: 22 mEq/L (ref 19–32)
Chloride: 100 mEq/L (ref 96–112)
Creatinine, Ser: 1.05 mg/dL (ref 0.50–1.10)
GFR calc Af Amer: 69 mL/min — ABNORMAL LOW (ref >90)
GFR, EST NON AFRICAN AMERICAN: 60 mL/min — AB (ref >90)
Glucose, Bld: 172 mg/dL — ABNORMAL HIGH (ref 70–99)
Potassium: 4.2 mEq/L (ref 3.7–5.3)
Sodium: 136 mEq/L — ABNORMAL LOW (ref 137–147)
Total Bilirubin: 0.3 mg/dL (ref 0.3–1.2)
Total Protein: 7.6 g/dL (ref 6.0–8.3)

## 2013-10-13 LAB — SURGICAL PCR SCREEN
MRSA, PCR: NEGATIVE
STAPHYLOCOCCUS AUREUS: NEGATIVE

## 2013-10-13 LAB — PROTIME-INR
INR: 0.99 (ref 0.00–1.49)
PROTHROMBIN TIME: 13.1 s (ref 11.6–15.2)

## 2013-10-13 LAB — URINE MICROSCOPIC-ADD ON

## 2013-10-13 LAB — APTT: aPTT: 28 seconds (ref 24–37)

## 2013-10-13 LAB — ABO/RH: ABO/RH(D): A POS

## 2013-10-13 NOTE — Progress Notes (Signed)
Pt.states that she has not seen a cardiologist in over a year. Cardiac stent placed in 2012.  No complaints of recent chest pain or discomfort or SOB.

## 2013-10-13 NOTE — Pre-Procedure Instructions (Addendum)
Christy [name]  10/13/2013   Your procedure is scheduled on:  10-19-2013  Thursday   Report to [facility] Admitting at 6:30 AM.     Call this number if you have problems the morning of surgery: [phone]   Remember:   Do not eat food or drink liquids after midnight.    Take these medicines the morning of surgery with A SIP OF WATER  Aspirin,metoprolol(Lopressor):    Do not wear jewelry, make-up or nail polish.   Do not wear lotions, powders, or perfumes. You may not wear deodorant.   Do not shave 48 hours prior to surgery. Men may shave face and neck.   Do not bring valuables to the hospital.  [facility] is not responsible  for any belongings or valuables.               Contacts, dentures or bridgework may not be worn into surgery.   Leave suitcase in the car. After surgery it may be brought to your room.   For patients admitted to the hospital, discharge time is determined by your treatment team.               .    Special Instructions: See Attached sheet for instructions on CHG  Shower /bath     Please read over the following fact sheets that you were given: Pain Booklet, Coughing and Deep Breathing, Blood Transfusion Information and Surgical Site Infection Prevention

## 2013-10-13 NOTE — Progress Notes (Signed)
Pt. Has screened at an elevated risk for obstructive sleep apnea during a pre-surgical visit using the STOP BANG tool.ascore of 4 or greater is considered an elevated risk.

## 2013-10-16 ENCOUNTER — Telehealth: Payer: Self-pay | Admitting: Vascular Surgery

## 2013-10-16 NOTE — Telephone Encounter (Signed)
Received info from [place], that Dr Michelle Piper, the anesthesiologist would like for Christy [name] to have cardiac clearance prior to her procedure on 10/19/13-  I scheduled Christy [name] to see Dr Purvis Sheffield at the [place] office tomorrow 10/17/13 @ 9:00am. I have left her a message to call back to confirm this appointment,. dpm

## 2013-10-16 NOTE — Progress Notes (Addendum)
Anesthesia Chart Review:  Patient is a 53 year old female scheduled for right FPBG with vein and right fourth toe amputation on 10/19/13 by Dr. Edilia [name].  History includes smoking, CAD/STEMI s/p LAD DES 09/2010, HTN, metabolic syndrome and DM2, UTI, situational depression '12, cholecystectomy, post-operative N/V, arthritis, PAD s/p right fifth toe amputation '14 with debridement of infected 5th metatarsal 03/23/13, cholecystectomy. BMI (41) is consistent with morbid obesity. OSA screening score was 4 or greater. PCP is listed as Dr. Assunta [name]. Cardiologist is Dr. Dietrich [name], with last visit 07/20/12.  He discontinued clopidogrel at that visit. "No complaints of recent chest pain or discomfort or SOB" according to [name].  I wasn't asked to evaluate her at her PAT appointment.  Meds: ASA , fish oil, glipizide, metformin, Lopressor, niacin, Nitro.  EKG on 10/09/13 showed NSR, low voltage QRS, non-specific anterior T wave abnormality.  Anterolateral T wave inversion improved since 10/02/10 but new non-specific T wave abnormality in V2-3 since 07/20/12.  Cardiac cath on 10/01/10 showed: LM widely patent. LCX medium size and appeared widely patent with small, patent OM1 and 2 and mid CX 70% stenosis in the continuation branch (small vessel). 90% mid LAD (ulcerated, hazy) and 70% mid to distal LAD. D1 90% stenosis extending up to the ostium of the vessel, D2 and D3 small and patent. RCA large proximally with diffuse moderate disease in the mid vessel and 40-50% at the crux. IMPRESSION/PROCEDURE:  1. Anterior ST elevation myocardial infarction. TIMI 3 flow at the time of diagnostic cath, 2 drug-eluting stent placed in the mid LAD at different spots of significant disease.  2. Mildly decreased LV function. Apical hypokinesis with EF 45%. 3. Moderate diffuse disease in the RCA and circ. There is also some moderate disease between the 2 stents in the mid LAD.   Aortogram with bilateral iliac arteriogram  with BLE runoff on 10/09/13 showed: FINDINGS:  1. There are single renal arteries bilaterally with no significant renal artery stenosis identified.  2. The infrarenal aorta is widely patent. Bilateral common iliac, external iliac, and hypogastric arteries are patent  3. On the right side, which is the symptomatic side, the superficial femoral artery is occluded proximally. The common femoral and deep femoral artery are patent. There is reconstitution of the popliteal artery at the level of the knee. There is two-vessel runoff on the left via the anterior tibial and peroneal arteries.  4. On the left side, the common femoral and deep femoral arteries are patent. The superficial femoral artery is occluded proximally. There is reconstitution of the above-knee pop 2 artery. There is two-vessel runoff on the left via the posterior tibial and peroneal arteries. The anterior tibial artery is patent proximally but occludes in the proximal calf.   CXR on 10/13/13 showed: No acute cardiopulmonary process.  Preoperative labs noted.  Cr 1.05, glucose 172. CBC and PT/PTT WNL.  She has known CAD, PAD, DM, morbid obesity, smoking, and positive screening tool for OSA risk.  She has not seen cardiology in > 1 year and has not had any recent cardiac function testing.  She needs vascular surgery. Above reviewed with anesthesiologist Dr. Michelle [name] who agrees with need for preoperative cardiac evaluation.  RN Christy [name] at [facility] has notified [facility] and patient can be seen by Dr. Purvis [name] tomorrow morning. Plans to proceed with depend on cardiology input.  Christy [name] [facility] Stay [facility]/Anesthesiology Phone [phone] 10/16/2013 3:08 PM  Addendum: 10/17/2013 1:00 PM I reviewed Dr. Junius [name] note  from this morning.  She was felt moderate risk for surgery, but he did not think that she would require non-invasive testing at this time.

## 2013-10-16 NOTE — Telephone Encounter (Signed)
Left message for patient at home # again, but also reached out to her emergency contact, sister- she will make sure her sister checks her voicemail, dpm

## 2013-10-17 ENCOUNTER — Encounter: Payer: Self-pay | Admitting: Cardiovascular Disease

## 2013-10-17 ENCOUNTER — Ambulatory Visit (INDEPENDENT_AMBULATORY_CARE_PROVIDER_SITE_OTHER): Payer: Managed Care, Other (non HMO) | Admitting: Cardiovascular Disease

## 2013-10-17 VITALS — BP 118/68 | HR 62 | Ht 65.0 in | Wt 246.0 lb

## 2013-10-17 DIAGNOSIS — I10 Essential (primary) hypertension: Secondary | ICD-10-CM

## 2013-10-17 DIAGNOSIS — N058 Unspecified nephritic syndrome with other morphologic changes: Secondary | ICD-10-CM

## 2013-10-17 DIAGNOSIS — E1121 Type 2 diabetes mellitus with diabetic nephropathy: Secondary | ICD-10-CM

## 2013-10-17 DIAGNOSIS — F17200 Nicotine dependence, unspecified, uncomplicated: Secondary | ICD-10-CM

## 2013-10-17 DIAGNOSIS — I739 Peripheral vascular disease, unspecified: Secondary | ICD-10-CM

## 2013-10-17 DIAGNOSIS — Z720 Tobacco use: Secondary | ICD-10-CM

## 2013-10-17 DIAGNOSIS — E785 Hyperlipidemia, unspecified: Secondary | ICD-10-CM

## 2013-10-17 DIAGNOSIS — I2510 Atherosclerotic heart disease of native coronary artery without angina pectoris: Secondary | ICD-10-CM

## 2013-10-17 DIAGNOSIS — Z01818 Encounter for other preprocedural examination: Secondary | ICD-10-CM

## 2013-10-17 DIAGNOSIS — E1129 Type 2 diabetes mellitus with other diabetic kidney complication: Secondary | ICD-10-CM

## 2013-10-17 MED ORDER — ATORVASTATIN CALCIUM 10 MG PO TABS: 10.0000 mg | ORAL_TABLET | Freq: Every day | ORAL | Status: DC

## 2013-10-17 NOTE — Progress Notes (Signed)
Patient ID: Christy [name], female   DOB: 11-Mar-1960, 53 y.o.   MRN: [id number]      SUBJECTIVE: The patient has a history of coronary artery disease with 2 previously placed stents in the LAD for an acute coronary syndrome (anterior wall STEMI on 10/01/10). She also has essential hypertension, hyperlipidemia and history of tobacco abuse, as well as type 2 diabetes mellitus. She also has a history of peripheral vascular disease and is been scheduled for right femoral to below knee popliteal artery bypass grafting and amputation of the right fourth toe on 10/19/13 by [name]. Coronary angiography in 09/2010 demonstrated the following:  1. Anterior ST elevation myocardial infarction. TIMI 3 flow at the  time of diagnostic cath, 2 drug-eluting stent placed in the mid LAD  at different spots of significant disease.  2. Mildly decreased LV function.  3. Moderate diffuse disease in the RCA and circ. There is also some  moderate disease between the 2 stents in the mid LAD.  Left ventriculography at that time demonstrated apical hypokinesis with an EF of 45%.  While she is not able to get much activity as she is limited by lower extremity claudication, she denies any significant exertional chest pain, shortness of breath and palpitations. She continues to smoke a half pack of cigarettes daily. She had been on simvastatin in the past as per review of the electronic medical records, and did not tolerate simvastatin 20 mg daily as it led to lower extremity myalgias which subsided after cessation.  She tells me her total cholesterol is 210. The most recent lipid panel in electronic records performed on 08/16/2012 demonstrate total cholesterol 144, triglycerides 252, HDL 36, LDL 58.  ECG performed on October 09, 2013 demonstrated normal sinus rhythm with no ischemic ST segment or T-wave abnormalities.  Review of Systems: As per "subjective", otherwise negative.  Allergies  Allergen Reactions  . Bactrim  Diarrhea  . Isosorbide Mononitrate Other (See Comments)    Made patient feel as though she was about to have another MI    Current Outpatient Prescriptions  Medication Sig Dispense Refill  . aspirin EC 81 MG tablet Take 81 mg by mouth daily.      . fish oil-omega-3 fatty acids 1000 MG capsule Take 3 g by mouth daily.       Marland Kitchen glipiZIDE (GLUCOTROL) 5 MG tablet Take 5 mg by mouth daily.      . metFORMIN (GLUCOPHAGE) 500 MG tablet Take 500 mg by mouth 2 (two) times daily with a meal.      . metoprolol tartrate (LOPRESSOR) 25 MG tablet Take 1 tablet (25 mg total) by mouth 2 (two) times daily.  180 tablet  3  . niacin 500 MG CR capsule Take 1 capsule (500 mg total) by mouth at bedtime.  30 capsule  6  . nitroGLYCERIN (NITROSTAT) 0.4 MG SL tablet Place 1 tablet (0.4 mg total) under the tongue every 5 (five) minutes as needed.  25 tablet  5   No current facility-administered medications for this visit.    Past Medical History  Diagnosis Date  . 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  . Tobacco abuse     60-90-pack-year history; 1/3 pack per day in 06-23-2011  . Hypertension   . Diabetes mellitus, type 2   . Depression 06/23/2010    Following sudden death of husband  . Metabolic syndrome     Elevated  triglycerides, low HDL, fasting hyperglycemia with low-dose sulfonylurea  . Myocardial infarction 09/2010  . PONV (postoperative nausea and vomiting)   . Chronic kidney disease     recent UTI  . Arthritis   . Coronary artery disease     Past Surgical History  Procedure Laterality Date  . Coronary angioplasty with stent placement  10/01/10    DES x2  . Cholecystectomy    . Bilateral oophorectomy  2011    for benign mass  . Metatarsal head excision Right 11/03/2012    Procedure: AMPUTATION OF RIGHT FIFTH TOE AND PORTION OF FIFTH METATARSAL;  Surgeon: Dallas Schimke, DPM;  Location: [facility];  Service: Orthopedics;  Laterality: Right;  .  Incision and drainage of wound Right 11/24/2012    Procedure: DEBRIDEMENT WOUND FOOT;  Surgeon: Dallas Schimke, DPM;  Location: [facility];  Service: Orthopedics;  Laterality: Right;  . Application of wound vac Right 11/24/2012    Procedure: APPLICATION OF WOUND VAC;  Surgeon: Dallas Schimke, DPM;  Location: [facility];  Service: Orthopedics;  Laterality: Right;  . Toe amputation    . Incision and drainage of wound Right 03/23/2013    Procedure: DEBRIDEMENT OF INFECTED BONE 5TH METATARSAL RIGHT FOOT;  Surgeon: Dallas Schimke, DPM;  Location: [facility];  Service: Orthopedics;  Laterality: Right;  . Breast surgery Left     brest biopsy    History   Social History  . Marital Status: Widowed    Spouse Name: N/A    Number of Children: N/A  . Years of Education: N/A   Occupational History  . Factory worker     Cytogeneticist   Social History Main Topics  . Smoking status: Current Every Day Smoker -- 0.50 packs/day for 30 years    Types: Cigarettes    Start date: 10/18/1983  . Smokeless tobacco: Never Used  . Alcohol Use: No  . Drug Use: No  . Sexual Activity: No   Other Topics Concern  . Not on file   Social History Narrative  . No narrative on file     Filed Vitals:   10/17/13 0856  BP: 118/68  Pulse: 62  Height:  (1.651 m)  Weight: 246 lb (111.585 kg)  SpO2: 95%    PHYSICAL EXAM General: NAD HEENT: Normal. Neck: No JVD, no thyromegaly. Lungs: Diminished sounds but clear to auscultation bilaterally. CV: Nondisplaced PMI.  Regular rate and rhythm, normal S1/S2, no S3/S4, no murmur. No pretibial edema.  No carotid bruit.  Abdomen: Soft, nontender, no hepatosplenomegaly, no distention.  Neurologic: Alert and oriented x 3.  Psych: Flat affect. Skin: Normal. Musculoskeletal: Normal range of motion. Extremities: No clubbing.   ECG: Most recent ECG reviewed.    ASSESSMENT AND PLAN: 1. Preoperative risk stratification: Given her numerous  comorbidities including coronary artery disease, essential hypertension, hyperlipidemia, type 2 diabetes mellitus, and ongoing tobacco abuse along with peripheral vascular disease, her risk for a major adverse cardiac event in the perioperative period is at least moderate. Unfortunately, she has not been able to tolerate statin therapy in the past. I plan to institute very low dose statin therapy after the completion of her surgery. She does not require noninvasive testing at this time as preoperative coronary revascularization will not change outcomes. Continue beta blocker therapy along with ASA.  2. CAD with h/o LAD stents: Stable ischemic heart disease. Continue therapy with aspirin and metoprolol. Statin therapy is warranted both for cholesterol reduction and for plaque stabilizing  effects. After the surgery is completed, I plan to start Lipitor 10 mg daily. If she is unable to tolerate this dose, she would be a candidate for PCSK-9 inhibitors.  3. Essential HTN: Controlled on metoprolol.  4. Hyperlipidemia: She has been intolerant of statin therapy in the past, as it has led to lower extremity myalgias. Symptoms abated with statin cessation. It appears she was on simvastatin 20 mg daily. Statin therapy is warranted both for cholesterol reduction and for plaque stabilizing pleiotropic effects. After the surgery is completed, I plan to start Lipitor 10 mg daily. If she is unable to tolerate this dose, she would be a candidate for PCSK-9 inhibitors.  5. Tobacco abuse: Cessation counseling provided.  6. Type II diabetes mellitus: On glipizide and metformin.  7. PVD: Scheduled to undergo surgery on 9/3 as noted above.  Dispo: f/u 1 year.  Time spent: 40 minutes, of which >50% was spent on counseling on medications, particularly statin therapy and its beneficial effects as well as review of all prior cardiac procedures and testing.  Prentice Docker, M.D., F.A.C.C.

## 2013-10-17 NOTE — Patient Instructions (Addendum)
Your physician wants you to follow-up in: 1 year You will receive a reminder letter in the mail two months in advance. If you don't receive a letter, please call our office to schedule the follow-up appointment.     Your physician has recommended you make the following change in your medication:    Please take Lipitor (Atorvastatin)10 mg at dinner time - start in 2 weeks   Please get blood work in 3 months (Lipid,LFT'S)     Thank you for choosing [facility] !

## 2013-10-18 MED ORDER — DEXTROSE 5 % IV SOLN
1.5000 g | INTRAVENOUS | Status: AC
  Administered 2013-10-19: 1.5 g via INTRAVENOUS
  Filled 2013-10-18: qty 1.5

## 2013-10-18 MED ORDER — SODIUM CHLORIDE 0.9 % IV SOLN: INTRAVENOUS | Status: DC

## 2013-10-19 ENCOUNTER — Encounter (HOSPITAL_COMMUNITY): Payer: Managed Care, Other (non HMO) | Admitting: Vascular Surgery

## 2013-10-19 ENCOUNTER — Ambulatory Visit (HOSPITAL_COMMUNITY): Payer: Managed Care, Other (non HMO)

## 2013-10-19 ENCOUNTER — Encounter (HOSPITAL_COMMUNITY)
Admission: RE | Disposition: A | Discharge status: Home Health | Payer: Self-pay | Source: Ambulatory Visit | Attending: Vascular Surgery

## 2013-10-19 ENCOUNTER — Inpatient Hospital Stay (HOSPITAL_COMMUNITY)
Admission: RE | Admit: 2013-10-19 | Discharge: 2013-10-21 | DRG: 253 | Disposition: A | Discharge status: Home Health | Payer: Managed Care, Other (non HMO) | Source: Ambulatory Visit | Attending: Vascular Surgery | Admitting: Vascular Surgery

## 2013-10-19 ENCOUNTER — Ambulatory Visit (HOSPITAL_COMMUNITY): Payer: Managed Care, Other (non HMO) | Admitting: Anesthesiology

## 2013-10-19 ENCOUNTER — Encounter (HOSPITAL_COMMUNITY): Payer: Self-pay | Admitting: Anesthesiology

## 2013-10-19 DIAGNOSIS — E7800 Pure hypercholesterolemia, unspecified: Secondary | ICD-10-CM | POA: Diagnosis present

## 2013-10-19 DIAGNOSIS — Z6841 Body Mass Index (BMI) 40.0 and over, adult: Secondary | ICD-10-CM

## 2013-10-19 DIAGNOSIS — I2510 Atherosclerotic heart disease of native coronary artery without angina pectoris: Secondary | ICD-10-CM | POA: Diagnosis present

## 2013-10-19 DIAGNOSIS — E119 Type 2 diabetes mellitus without complications: Secondary | ICD-10-CM | POA: Diagnosis present

## 2013-10-19 DIAGNOSIS — E8881 Metabolic syndrome: Secondary | ICD-10-CM | POA: Diagnosis present

## 2013-10-19 DIAGNOSIS — Z79899 Other long term (current) drug therapy: Secondary | ICD-10-CM

## 2013-10-19 DIAGNOSIS — I129 Hypertensive chronic kidney disease with stage 1 through stage 4 chronic kidney disease, or unspecified chronic kidney disease: Secondary | ICD-10-CM | POA: Diagnosis present

## 2013-10-19 DIAGNOSIS — I252 Old myocardial infarction: Secondary | ICD-10-CM

## 2013-10-19 DIAGNOSIS — N189 Chronic kidney disease, unspecified: Secondary | ICD-10-CM | POA: Diagnosis present

## 2013-10-19 DIAGNOSIS — Z9861 Coronary angioplasty status: Secondary | ICD-10-CM | POA: Diagnosis not present

## 2013-10-19 DIAGNOSIS — Z87440 Personal history of urinary (tract) infections: Secondary | ICD-10-CM

## 2013-10-19 DIAGNOSIS — I743 Embolism and thrombosis of arteries of the lower extremities: Secondary | ICD-10-CM | POA: Diagnosis present

## 2013-10-19 DIAGNOSIS — Z7982 Long term (current) use of aspirin: Secondary | ICD-10-CM

## 2013-10-19 DIAGNOSIS — L98499 Non-pressure chronic ulcer of skin of other sites with unspecified severity: Secondary | ICD-10-CM

## 2013-10-19 DIAGNOSIS — I70209 Unspecified atherosclerosis of native arteries of extremities, unspecified extremity: Secondary | ICD-10-CM

## 2013-10-19 DIAGNOSIS — Z720 Tobacco use: Secondary | ICD-10-CM

## 2013-10-19 DIAGNOSIS — I739 Peripheral vascular disease, unspecified: Secondary | ICD-10-CM | POA: Diagnosis present

## 2013-10-19 DIAGNOSIS — F17200 Nicotine dependence, unspecified, uncomplicated: Secondary | ICD-10-CM | POA: Diagnosis present

## 2013-10-19 HISTORY — PX: INTRAOPERATIVE ARTERIOGRAM: SHX5157

## 2013-10-19 HISTORY — PX: AMPUTATION: SHX166

## 2013-10-19 HISTORY — PX: FEMORAL-POPLITEAL BYPASS GRAFT: SHX937

## 2013-10-19 LAB — CREATININE, SERUM
Creatinine, Ser: 1.19 mg/dL — ABNORMAL HIGH (ref 0.50–1.10)
GFR calc Af Amer: 59 mL/min — ABNORMAL LOW (ref >90)
GFR calc non Af Amer: 51 mL/min — ABNORMAL LOW (ref >90)

## 2013-10-19 LAB — GLUCOSE, CAPILLARY
GLUCOSE-CAPILLARY: 157 mg/dL — AB (ref 70–99)
GLUCOSE-CAPILLARY: 112 mg/dL — AB (ref 70–99)
Glucose-Capillary: 114 mg/dL — ABNORMAL HIGH (ref 70–99)

## 2013-10-19 LAB — CBC
HEMATOCRIT: 36.3 % (ref 36.0–46.0)
Hemoglobin: 12 g/dL (ref 12.0–15.0)
MCH: 32.2 pg (ref 26.0–34.0)
MCHC: 33.1 g/dL (ref 30.0–36.0)
MCV: 97.3 fL (ref 78.0–100.0)
Platelets: 208 10*3/uL (ref 150–400)
RBC: 3.73 MIL/uL — ABNORMAL LOW (ref 3.87–5.11)
RDW: 14 % (ref 11.5–15.5)
WBC: 11.4 10*3/uL — ABNORMAL HIGH (ref 4.0–10.5)

## 2013-10-19 SURGERY — BYPASS GRAFT FEMORAL-POPLITEAL ARTERY
Anesthesia: General | Site: Toe | Laterality: Right

## 2013-10-19 MED ORDER — FENTANYL CITRATE 0.05 MG/ML IJ SOLN
INTRAMUSCULAR | Status: DC | PRN
  Administered 2013-10-19 (×2): 50 ug via INTRAVENOUS
  Administered 2013-10-19: 150 ug via INTRAVENOUS
  Administered 2013-10-19: 100 ug via INTRAVENOUS
  Administered 2013-10-19 (×3): 50 ug via INTRAVENOUS

## 2013-10-19 MED ORDER — ROCURONIUM BROMIDE 100 MG/10ML IV SOLN
INTRAVENOUS | Status: DC | PRN
  Administered 2013-10-19: 20 mg via INTRAVENOUS
  Administered 2013-10-19: 30 mg via INTRAVENOUS
  Administered 2013-10-19: 20 mg via INTRAVENOUS

## 2013-10-19 MED ORDER — HYDROMORPHONE HCL PF 1 MG/ML IJ SOLN
INTRAMUSCULAR | Status: AC
  Filled 2013-10-19: qty 1

## 2013-10-19 MED ORDER — SODIUM CHLORIDE 0.9 % IV SOLN
10.0000 mg | INTRAVENOUS | Status: DC | PRN
  Administered 2013-10-19: 10 ug/min via INTRAVENOUS

## 2013-10-19 MED ORDER — PNEUMOCOCCAL VAC POLYVALENT 25 MCG/0.5ML IJ INJ
0.5000 mL | INJECTION | INTRAMUSCULAR | Status: DC
  Filled 2013-10-19: qty 0.5

## 2013-10-19 MED ORDER — LABETALOL HCL 5 MG/ML IV SOLN
10.0000 mg | INTRAVENOUS | Status: DC | PRN
  Filled 2013-10-19: qty 4

## 2013-10-19 MED ORDER — POTASSIUM CHLORIDE CRYS ER 20 MEQ PO TBCR: 20.0000 meq | EXTENDED_RELEASE_TABLET | Freq: Every day | ORAL | Status: DC | PRN

## 2013-10-19 MED ORDER — SODIUM CHLORIDE 0.9 % IJ SOLN
INTRAMUSCULAR | Status: AC
  Filled 2013-10-19: qty 10

## 2013-10-19 MED ORDER — LACTATED RINGERS IV SOLN
INTRAVENOUS | Status: DC
  Administered 2013-10-19: 08:00:00 via INTRAVENOUS

## 2013-10-19 MED ORDER — ROCURONIUM BROMIDE 50 MG/5ML IV SOLN
INTRAVENOUS | Status: AC
  Filled 2013-10-19: qty 2

## 2013-10-19 MED ORDER — ARTIFICIAL TEARS OP OINT
TOPICAL_OINTMENT | OPHTHALMIC | Status: AC
  Filled 2013-10-19: qty 3.5

## 2013-10-19 MED ORDER — LIDOCAINE HCL (CARDIAC) 20 MG/ML IV SOLN
INTRAVENOUS | Status: DC | PRN
  Administered 2013-10-19: 100 mg via INTRAVENOUS

## 2013-10-19 MED ORDER — NITROGLYCERIN 0.4 MG SL SUBL: 0.4000 mg | SUBLINGUAL_TABLET | SUBLINGUAL | Status: DC | PRN

## 2013-10-19 MED ORDER — NEOSTIGMINE METHYLSULFATE 10 MG/10ML IV SOLN
INTRAVENOUS | Status: AC
  Filled 2013-10-19: qty 1

## 2013-10-19 MED ORDER — MAGNESIUM SULFATE 40 MG/ML IJ SOLN
2.0000 g | Freq: Every day | INTRAMUSCULAR | Status: DC | PRN
  Filled 2013-10-19: qty 50

## 2013-10-19 MED ORDER — HEPARIN SODIUM (PORCINE) 1000 UNIT/ML IJ SOLN
INTRAMUSCULAR | Status: DC | PRN
  Administered 2013-10-19: 9000 [IU] via INTRAVENOUS

## 2013-10-19 MED ORDER — ACETAMINOPHEN 325 MG PO TABS: 325.0000 mg | ORAL_TABLET | ORAL | Status: DC | PRN

## 2013-10-19 MED ORDER — 0.9 % SODIUM CHLORIDE (POUR BTL) OPTIME
TOPICAL | Status: DC | PRN
  Administered 2013-10-19 (×2): 1000 mL

## 2013-10-19 MED ORDER — INSULIN ASPART 100 UNIT/ML ~~LOC~~ SOLN
0.0000 [IU] | Freq: Three times a day (TID) | SUBCUTANEOUS | Status: DC
  Administered 2013-10-20 (×2): 2 [IU] via SUBCUTANEOUS

## 2013-10-19 MED ORDER — GLYCOPYRROLATE 0.2 MG/ML IJ SOLN
INTRAMUSCULAR | Status: DC | PRN
  Administered 2013-10-19: 0.6 mg via INTRAVENOUS
  Administered 2013-10-19: 0.2 mg via INTRAVENOUS

## 2013-10-19 MED ORDER — NIACIN ER 500 MG PO CPCR
500.0000 mg | ORAL_CAPSULE | Freq: Every day | ORAL | Status: DC
  Administered 2013-10-19 – 2013-10-20 (×2): 500 mg via ORAL
  Filled 2013-10-19 (×3): qty 1

## 2013-10-19 MED ORDER — METOPROLOL TARTRATE 25 MG PO TABS
25.0000 mg | ORAL_TABLET | Freq: Two times a day (BID) | ORAL | Status: DC
  Administered 2013-10-20 – 2013-10-21 (×2): 25 mg via ORAL
  Filled 2013-10-19 (×5): qty 1

## 2013-10-19 MED ORDER — GLIPIZIDE 5 MG PO TABS
5.0000 mg | ORAL_TABLET | Freq: Every day | ORAL | Status: DC
  Administered 2013-10-19 – 2013-10-21 (×3): 5 mg via ORAL
  Filled 2013-10-19 (×4): qty 1

## 2013-10-19 MED ORDER — OXYCODONE-ACETAMINOPHEN 5-325 MG PO TABS
1.0000 | ORAL_TABLET | ORAL | Status: DC | PRN
  Administered 2013-10-19 – 2013-10-21 (×5): 2 via ORAL
  Filled 2013-10-19 (×5): qty 2

## 2013-10-19 MED ORDER — SUCCINYLCHOLINE CHLORIDE 20 MG/ML IJ SOLN
INTRAMUSCULAR | Status: AC
  Filled 2013-10-19: qty 1

## 2013-10-19 MED ORDER — PANTOPRAZOLE SODIUM 40 MG PO TBEC
40.0000 mg | DELAYED_RELEASE_TABLET | Freq: Every day | ORAL | Status: DC
  Administered 2013-10-19 – 2013-10-21 (×2): 40 mg via ORAL
  Filled 2013-10-19 (×3): qty 1

## 2013-10-19 MED ORDER — PHENYLEPHRINE HCL 10 MG/ML IJ SOLN
INTRAMUSCULAR | Status: DC | PRN
  Administered 2013-10-19: 80 ug via INTRAVENOUS
  Administered 2013-10-19 (×3): 40 ug via INTRAVENOUS
  Administered 2013-10-19: 80 ug via INTRAVENOUS
  Administered 2013-10-19 (×2): 40 ug via INTRAVENOUS

## 2013-10-19 MED ORDER — ATORVASTATIN CALCIUM 10 MG PO TABS
10.0000 mg | ORAL_TABLET | Freq: Every day | ORAL | Status: DC
  Administered 2013-10-21: 10 mg via ORAL
  Filled 2013-10-19 (×3): qty 1

## 2013-10-19 MED ORDER — ONDANSETRON HCL 4 MG/2ML IJ SOLN
INTRAMUSCULAR | Status: AC
  Filled 2013-10-19: qty 2

## 2013-10-19 MED ORDER — SODIUM CHLORIDE 0.9 % IR SOLN
Status: DC | PRN
  Administered 2013-10-19: 09:00:00

## 2013-10-19 MED ORDER — CHLORHEXIDINE GLUCONATE 4 % EX LIQD
60.0000 mL | Freq: Once | CUTANEOUS | Status: DC
  Filled 2013-10-19: qty 60

## 2013-10-19 MED ORDER — PROPOFOL 10 MG/ML IV BOLUS
INTRAVENOUS | Status: AC
  Filled 2013-10-19: qty 20

## 2013-10-19 MED ORDER — SUCCINYLCHOLINE CHLORIDE 20 MG/ML IJ SOLN
INTRAMUSCULAR | Status: DC | PRN
  Administered 2013-10-19: 120 mg via INTRAVENOUS

## 2013-10-19 MED ORDER — ENOXAPARIN SODIUM 30 MG/0.3ML ~~LOC~~ SOLN
30.0000 mg | SUBCUTANEOUS | Status: DC
  Administered 2013-10-19 – 2013-10-20 (×2): 30 mg via SUBCUTANEOUS
  Filled 2013-10-19 (×4): qty 0.3

## 2013-10-19 MED ORDER — LIDOCAINE HCL (CARDIAC) 20 MG/ML IV SOLN
INTRAVENOUS | Status: AC
  Filled 2013-10-19: qty 5

## 2013-10-19 MED ORDER — IOHEXOL 300 MG/ML  SOLN
INTRAMUSCULAR | Status: DC | PRN
  Administered 2013-10-19: 30 mL via INTRAVENOUS

## 2013-10-19 MED ORDER — LACTATED RINGERS IV SOLN
INTRAVENOUS | Status: DC | PRN
  Administered 2013-10-19 (×3): via INTRAVENOUS

## 2013-10-19 MED ORDER — PIPERACILLIN-TAZOBACTAM 3.375 G IVPB
3.3750 g | Freq: Four times a day (QID) | INTRAVENOUS | Status: DC
  Filled 2013-10-19 (×3): qty 50

## 2013-10-19 MED ORDER — METFORMIN HCL 500 MG PO TABS
500.0000 mg | ORAL_TABLET | Freq: Two times a day (BID) | ORAL | Status: DC
  Filled 2013-10-19 (×6): qty 1

## 2013-10-19 MED ORDER — PROPOFOL 10 MG/ML IV BOLUS
INTRAVENOUS | Status: DC | PRN
  Administered 2013-10-19: 200 mg via INTRAVENOUS

## 2013-10-19 MED ORDER — BACITRACIN ZINC 500 UNIT/GM EX OINT
TOPICAL_OINTMENT | CUTANEOUS | Status: DC | PRN
  Administered 2013-10-19: 1 via TOPICAL

## 2013-10-19 MED ORDER — PHENYLEPHRINE HCL 10 MG/ML IJ SOLN
INTRAMUSCULAR | Status: AC
  Filled 2013-10-19: qty 1

## 2013-10-19 MED ORDER — HYDROMORPHONE HCL PF 1 MG/ML IJ SOLN
0.2500 mg | INTRAMUSCULAR | Status: DC | PRN
  Administered 2013-10-19 (×2): 1 mg via INTRAVENOUS

## 2013-10-19 MED ORDER — PROTAMINE SULFATE 10 MG/ML IV SOLN
INTRAVENOUS | Status: DC | PRN
  Administered 2013-10-19: 40 mg via INTRAVENOUS

## 2013-10-19 MED ORDER — HYDRALAZINE HCL 20 MG/ML IJ SOLN
10.0000 mg | INTRAMUSCULAR | Status: DC | PRN
  Filled 2013-10-19: qty 0.5

## 2013-10-19 MED ORDER — ATROPINE SULFATE 0.1 MG/ML IJ SOLN
INTRAMUSCULAR | Status: AC
  Filled 2013-10-19: qty 10

## 2013-10-19 MED ORDER — FENTANYL CITRATE 0.05 MG/ML IJ SOLN
INTRAMUSCULAR | Status: AC
  Filled 2013-10-19: qty 5

## 2013-10-19 MED ORDER — ONDANSETRON HCL 4 MG/2ML IJ SOLN
INTRAMUSCULAR | Status: DC | PRN
  Administered 2013-10-19: 4 mg via INTRAVENOUS

## 2013-10-19 MED ORDER — PAPAVERINE HCL 30 MG/ML IJ SOLN
INTRAMUSCULAR | Status: AC
  Filled 2013-10-19: qty 2

## 2013-10-19 MED ORDER — PIPERACILLIN-TAZOBACTAM 3.375 G IVPB
3.3750 g | Freq: Three times a day (TID) | INTRAVENOUS | Status: AC
  Administered 2013-10-19 – 2013-10-20 (×3): 3.375 g via INTRAVENOUS
  Filled 2013-10-19 (×3): qty 50

## 2013-10-19 MED ORDER — THROMBIN 20000 UNITS EX SOLR
CUTANEOUS | Status: AC
  Filled 2013-10-19: qty 20000

## 2013-10-19 MED ORDER — METOPROLOL TARTRATE 1 MG/ML IV SOLN
2.0000 mg | INTRAVENOUS | Status: DC | PRN
  Filled 2013-10-19: qty 5

## 2013-10-19 MED ORDER — MORPHINE SULFATE 4 MG/ML IJ SOLN: 3.0000 mg | INTRAMUSCULAR | Status: DC | PRN

## 2013-10-19 MED ORDER — ONDANSETRON HCL 4 MG/2ML IJ SOLN: 4.0000 mg | Freq: Once | INTRAMUSCULAR | Status: DC | PRN

## 2013-10-19 MED ORDER — BACITRACIN ZINC 500 UNIT/GM EX OINT
TOPICAL_OINTMENT | CUTANEOUS | Status: AC
  Filled 2013-10-19: qty 15

## 2013-10-19 MED ORDER — GLYCOPYRROLATE 0.2 MG/ML IJ SOLN
INTRAMUSCULAR | Status: AC
  Filled 2013-10-19: qty 3

## 2013-10-19 MED ORDER — ONDANSETRON HCL 4 MG/2ML IJ SOLN: 4.0000 mg | Freq: Four times a day (QID) | INTRAMUSCULAR | Status: DC | PRN

## 2013-10-19 MED ORDER — SODIUM CHLORIDE 0.9 % IV SOLN: 500.0000 mL | Freq: Once | INTRAVENOUS | Status: AC | PRN

## 2013-10-19 MED ORDER — DOPAMINE-DEXTROSE 3.2-5 MG/ML-% IV SOLN
3.0000 ug/kg/min | INTRAVENOUS | Status: DC
  Filled 2013-10-19: qty 250

## 2013-10-19 MED ORDER — DIPHENHYDRAMINE HCL 50 MG/ML IJ SOLN
INTRAMUSCULAR | Status: DC | PRN
  Administered 2013-10-19: 25 mg via INTRAVENOUS

## 2013-10-19 MED ORDER — SODIUM CHLORIDE 0.9 % IV SOLN
INTRAVENOUS | Status: DC
  Administered 2013-10-19 – 2013-10-20 (×2): via INTRAVENOUS

## 2013-10-19 MED ORDER — DOCUSATE SODIUM 100 MG PO CAPS
100.0000 mg | ORAL_CAPSULE | Freq: Every day | ORAL | Status: DC
  Filled 2013-10-19: qty 1

## 2013-10-19 MED ORDER — ASPIRIN EC 81 MG PO TBEC
81.0000 mg | DELAYED_RELEASE_TABLET | Freq: Every day | ORAL | Status: DC
  Administered 2013-10-20 – 2013-10-21 (×2): 81 mg via ORAL
  Filled 2013-10-19 (×3): qty 1

## 2013-10-19 MED ORDER — ACETAMINOPHEN 650 MG RE SUPP: 325.0000 mg | RECTAL | Status: DC | PRN

## 2013-10-19 MED ORDER — GUAIFENESIN-DM 100-10 MG/5ML PO SYRP: 15.0000 mL | ORAL_SOLUTION | ORAL | Status: DC | PRN

## 2013-10-19 MED ORDER — ATROPINE SULFATE 0.4 MG/ML IJ SOLN
INTRAMUSCULAR | Status: DC | PRN
  Administered 2013-10-19 (×2): .1 mg via INTRAVENOUS

## 2013-10-19 MED ORDER — NEOSTIGMINE METHYLSULFATE 10 MG/10ML IV SOLN
INTRAVENOUS | Status: DC | PRN
  Administered 2013-10-19: 4 mg via INTRAVENOUS

## 2013-10-19 MED ORDER — EPHEDRINE SULFATE 50 MG/ML IJ SOLN
INTRAMUSCULAR | Status: AC
  Filled 2013-10-19: qty 1

## 2013-10-19 MED ORDER — EPHEDRINE SULFATE 50 MG/ML IJ SOLN
INTRAMUSCULAR | Status: DC | PRN
  Administered 2013-10-19 (×2): 10 mg via INTRAVENOUS

## 2013-10-19 MED ORDER — PHENYLEPHRINE 40 MCG/ML (10ML) SYRINGE FOR IV PUSH (FOR BLOOD PRESSURE SUPPORT)
PREFILLED_SYRINGE | INTRAVENOUS | Status: AC
  Filled 2013-10-19: qty 10

## 2013-10-19 MED ORDER — PAPAVERINE HCL 30 MG/ML IJ SOLN
INTRAMUSCULAR | Status: DC | PRN
  Administered 2013-10-19: 60 mg

## 2013-10-19 MED ORDER — ARTIFICIAL TEARS OP OINT
TOPICAL_OINTMENT | OPHTHALMIC | Status: DC | PRN
  Administered 2013-10-19: 1 via OPHTHALMIC

## 2013-10-19 SURGICAL SUPPLY — 86 items
ADH SKN CLS APL DERMABOND .7 (GAUZE/BANDAGES/DRESSINGS) ×3
BANDAGE ELASTIC 4 VELCRO ST LF (GAUZE/BANDAGES/DRESSINGS) ×5 IMPLANT
BANDAGE ESMARK 6X9 LF (GAUZE/BANDAGES/DRESSINGS) IMPLANT
BLADE 10 SAFETY STRL DISP (BLADE) ×3 IMPLANT
BLADE AVERAGE 25MMX9MM (BLADE)
BLADE AVERAGE 25X9 (BLADE) IMPLANT
BNDG CMPR 9X6 STRL LF SNTH (GAUZE/BANDAGES/DRESSINGS)
BNDG CONFORM 3 STRL LF (GAUZE/BANDAGES/DRESSINGS) ×3 IMPLANT
BNDG ESMARK 6X9 LF (GAUZE/BANDAGES/DRESSINGS)
BNDG GAUZE ELAST 4 BULKY (GAUZE/BANDAGES/DRESSINGS) ×5 IMPLANT
CANISTER SUCTION 2500CC (MISCELLANEOUS) ×5 IMPLANT
CANNULA VESSEL 3MM 2 BLNT TIP (CANNULA) ×9 IMPLANT
CLIP TI MEDIUM 24 (CLIP) ×5 IMPLANT
CLIP TI WIDE RED SMALL 24 (CLIP) ×7 IMPLANT
COVER SURGICAL LIGHT HANDLE (MISCELLANEOUS) ×5 IMPLANT
CUFF TOURNIQUET SINGLE 24IN (TOURNIQUET CUFF) IMPLANT
CUFF TOURNIQUET SINGLE 34IN LL (TOURNIQUET CUFF) IMPLANT
CUFF TOURNIQUET SINGLE 44IN (TOURNIQUET CUFF) IMPLANT
DERMABOND ADVANCED (GAUZE/BANDAGES/DRESSINGS) ×2
DERMABOND ADVANCED .7 DNX12 (GAUZE/BANDAGES/DRESSINGS) IMPLANT
DRAIN CHANNEL 15F RND FF W/TCR (WOUND CARE) ×2 IMPLANT
DRAPE EXTREMITY T 121X128X90 (DRAPE) ×3 IMPLANT
DRAPE WARM FLUID 44X44 (DRAPE) ×5 IMPLANT
DRAPE X-RAY CASS 24X20 (DRAPES) IMPLANT
DRSG COVADERM 4X6 (GAUZE/BANDAGES/DRESSINGS) ×2 IMPLANT
DRSG COVADERM 4X8 (GAUZE/BANDAGES/DRESSINGS) ×2 IMPLANT
DRSG TEGADERM 2-3/8X2-3/4 SM (GAUZE/BANDAGES/DRESSINGS) ×2 IMPLANT
ELECT REM PT RETURN 9FT ADLT (ELECTROSURGICAL) ×5
ELECTRODE REM PT RTRN 9FT ADLT (ELECTROSURGICAL) ×3 IMPLANT
EVACUATOR SILICONE 100CC (DRAIN) ×2 IMPLANT
GAUZE SPONGE 2X2 8PLY STRL LF (GAUZE/BANDAGES/DRESSINGS) IMPLANT
GAUZE SPONGE 4X4 12PLY STRL (GAUZE/BANDAGES/DRESSINGS) ×3 IMPLANT
GLOVE BIO SURGEON STRL SZ7.5 (GLOVE) ×5 IMPLANT
GLOVE BIOGEL PI IND STRL 6.5 (GLOVE) IMPLANT
GLOVE BIOGEL PI IND STRL 7.0 (GLOVE) IMPLANT
GLOVE BIOGEL PI IND STRL 8 (GLOVE) ×3 IMPLANT
GLOVE BIOGEL PI INDICATOR 6.5 (GLOVE) ×2
GLOVE BIOGEL PI INDICATOR 7.0 (GLOVE) ×2
GLOVE BIOGEL PI INDICATOR 8 (GLOVE) ×2
GOWN STRL REUS W/ TWL LRG LVL3 (GOWN DISPOSABLE) ×9 IMPLANT
GOWN STRL REUS W/TWL LRG LVL3 (GOWN DISPOSABLE) ×15
KIT BASIN OR (CUSTOM PROCEDURE TRAY) ×5 IMPLANT
KIT ROOM TURNOVER OR (KITS) ×5 IMPLANT
MARKER GRAFT CORONARY BYPASS (MISCELLANEOUS) ×2 IMPLANT
NDL 18GX1X1/2 (RX/OR ONLY) (NEEDLE) IMPLANT
NDL INFUSION SET 21GA (NEEDLE) IMPLANT
NEEDLE 18GX1X1/2 (RX/OR ONLY) (NEEDLE) ×5 IMPLANT
NEEDLE INFUSION SET 21GA (NEEDLE) ×5 IMPLANT
NS IRRIG 1000ML POUR BTL (IV SOLUTION) ×10 IMPLANT
PACK GENERAL/GYN (CUSTOM PROCEDURE TRAY) ×5 IMPLANT
PACK PERIPHERAL VASCULAR (CUSTOM PROCEDURE TRAY) ×5 IMPLANT
PAD ARMBOARD 7.5X6 YLW CONV (MISCELLANEOUS) ×8 IMPLANT
PADDING CAST COTTON 6X4 STRL (CAST SUPPLIES) IMPLANT
PROBE PENCIL 8 MHZ STRL DISP (MISCELLANEOUS) ×2 IMPLANT
SET COLLECT BLD 21X3/4 12 (NEEDLE) IMPLANT
SPECIMEN JAR SMALL (MISCELLANEOUS) ×3 IMPLANT
SPONGE GAUZE 2X2 STER 10/PKG (GAUZE/BANDAGES/DRESSINGS) ×2
SPONGE GAUZE 4X4 12PLY STER LF (GAUZE/BANDAGES/DRESSINGS) ×2 IMPLANT
SPONGE SURGIFOAM ABS GEL 100 (HEMOSTASIS) IMPLANT
STAPLER VISISTAT (STAPLE) IMPLANT
STOPCOCK 4 WAY LG BORE MALE ST (IV SETS) ×2 IMPLANT
SUT ETHILON 3 0 PS 1 (SUTURE) ×7 IMPLANT
SUT PROLENE 5 0 C 1 24 (SUTURE) ×5 IMPLANT
SUT PROLENE 6 0 BV (SUTURE) ×7 IMPLANT
SUT PROLENE 7 0 BV 1 (SUTURE) IMPLANT
SUT SILK 2 0 (SUTURE) ×5
SUT SILK 2 0 FS (SUTURE) ×3 IMPLANT
SUT SILK 2-0 18XBRD TIE 12 (SUTURE) IMPLANT
SUT SILK 3 0 (SUTURE) ×10
SUT SILK 3-0 18XBRD TIE 12 (SUTURE) IMPLANT
SUT VIC AB 2-0 CT1 27 (SUTURE) ×5
SUT VIC AB 2-0 CT1 TAPERPNT 27 (SUTURE) IMPLANT
SUT VIC AB 2-0 CTB1 (SUTURE) ×10 IMPLANT
SUT VIC AB 3-0 SH 27 (SUTURE) ×15
SUT VIC AB 3-0 SH 27X BRD (SUTURE) ×6 IMPLANT
SUT VIC AB 4-0 PS2 27 (SUTURE) ×6 IMPLANT
SUT VICRYL 4-0 PS2 18IN ABS (SUTURE) ×10 IMPLANT
SWAB COLLECTION DEVICE MRSA (MISCELLANEOUS) IMPLANT
SYR 5ML LL (SYRINGE) ×2 IMPLANT
TOWEL OR 17X24 6PK STRL BLUE (TOWEL DISPOSABLE) ×6 IMPLANT
TOWEL OR 17X26 10 PK STRL BLUE (TOWEL DISPOSABLE) ×6 IMPLANT
TRAY FOLEY CATH 16FRSI W/METER (SET/KITS/TRAYS/PACK) ×5 IMPLANT
TUBE ANAEROBIC SPECIMEN COL (MISCELLANEOUS) IMPLANT
TUBING EXTENTION W/L.L. (IV SETS) ×2 IMPLANT
UNDERPAD 30X30 INCONTINENT (UNDERPADS AND DIAPERS) ×5 IMPLANT
WATER STERILE IRR 1000ML POUR (IV SOLUTION) ×5 IMPLANT

## 2013-10-19 NOTE — Progress Notes (Signed)
vasc PA/Maureen in / doppled pulse ANT TIB and POST TIB / satisfied with exam, aware pt moving to 3s

## 2013-10-19 NOTE — Progress Notes (Signed)
Utilization review completed.  

## 2013-10-19 NOTE — Anesthesia Preprocedure Evaluation (Signed)
Anesthesia Evaluation  Patient identified by MRN, date of birth, ID band Patient awake    Reviewed: Allergy & Precautions, H&P , NPO status , Patient's Chart, lab work & pertinent test results  History of Anesthesia Complications (+) PONV  Airway       Dental   Pulmonary Current Smoker,          Cardiovascular hypertension, + CAD, + Past MI, + Cardiac Stents and + Peripheral Vascular Disease     Neuro/Psych Depression    GI/Hepatic   Endo/Other  diabetes, Type 2, Oral Hypoglycemic AgentsMorbid obesity  Renal/GU Renal disease     Musculoskeletal  (+) Arthritis -,   Abdominal   Peds  Hematology   Anesthesia Other Findings   Reproductive/Obstetrics                           Anesthesia Physical Anesthesia Plan  ASA: III  Anesthesia Plan: General   Post-op Pain Management:    Induction: Intravenous  Airway Management Planned: Oral ETT  Additional Equipment:   Intra-op Plan:   Post-operative Plan: Extubation in OR  Informed Consent: I have reviewed the patients History and Physical, chart, labs and discussed the procedure including the risks, benefits and alternatives for the proposed anesthesia with the patient or authorized representative who has indicated his/her understanding and acceptance.     Plan Discussed with: CRNA, Anesthesiologist and Surgeon  Anesthesia Plan Comments:         Anesthesia Quick Evaluation

## 2013-10-19 NOTE — Interval H&P Note (Signed)
History and Physical Interval Note:  10/19/2013 7:07 AM  Christy [name]  has presented today for surgery, with the diagnosis of Peripheral Vascular Disease with Nonhealing wound right 4th toe  The various methods of treatment have been discussed with the patient and family. After consideration of risks, benefits and other options for treatment, the patient has consented to  Procedure(s): BYPASS GRAFT FEMORAL-BELOW KNEE POPLITEAL ARTERY-RIGHT (Right) AMPUTATION DIGIT-RIGHT 4TH TOE AMPUTATION (Right) as a surgical intervention .  The patient's history has been reviewed, patient examined, no change in status, stable for surgery.  I have reviewed the patient's chart and labs.  Questions were answered to the patient's satisfaction.     [name]

## 2013-10-19 NOTE — Anesthesia Procedure Notes (Signed)
Procedure Name: Intubation Date/Time: 10/19/2013 7:59 AM Performed by: Coralee Rud Pre-anesthesia Checklist: Patient identified, Emergency Drugs available, Suction available, Patient being monitored and Timeout performed Patient Re-evaluated:Patient Re-evaluated prior to inductionOxygen Delivery Method: Circle system utilized Preoxygenation: Pre-oxygenation with 100% oxygen Intubation Type: IV induction Ventilation: Mask ventilation without difficulty Laryngoscope Size: Miller and 2 Grade View: Grade I Tube type: Oral Tube size: 7.5 mm Number of attempts: 1 Airway Equipment and Method: Stylet Placement Confirmation: ETT inserted through vocal cords under direct vision,  positive ETCO2 and breath sounds checked- equal and bilateral Secured at: 22 cm Tube secured with: Tape Dental Injury: Teeth and Oropharynx as per pre-operative assessment

## 2013-10-19 NOTE — H&P (View-Only) (Signed)
[facility] [place]  Patient name: Christy [name] MRN: [id number] DOB: 06-07-1960 Sex: female  REASON FOR ADMISSION: Infrainguinal arterial occlusive disease on the right with a nonhealing wound of the right fourth toe.  HPI: Christy [name] is a 53 y.o. female who is admitted for elective right femoral to below knee popliteal artery bypass grafting and amputation of the right fourth toe. I saw her in consultation on 10/05/2013. She had been referred by Dr. Mills Koller from the wound care center. She had undergone previous amputation of the right fifth toe by Dr. Nolen Mu. She subsequently developed a wound on her adjacent fourth toe which has been nonhealing. She has been treated with doxycycline. Her workup included a Doppler study which showed an ABI of 40% on the right and 64% on the left. Toe pressure on the right with 23 mm of mercury and toe pressure on the left was 47 mm of mercury.  She does describe bilateral lower extremity claudication. She denies any history of rest pain.  Her risk factors for peripheral vascular disease include diabetes, hypertension, hypercholesterolemia, and tobacco use. She has smoked a pack per day of cigarettes and she was 53 years old. We have had multiple discussions about the importance of tobacco cessation since her original visit.  Past Medical History  Diagnosis Date  . 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  . Tobacco abuse     60-90-pack-year history; 1/3 pack per day in 07/15/2011  . Hypertension   . Diabetes mellitus, type 2   . Depression 07/15/2010    Following sudden death of husband  . Metabolic syndrome     Elevated triglycerides, low HDL, fasting hyperglycemia with low-dose sulfonylurea  . Myocardial infarction 09/2010  . PONV (postoperative nausea and vomiting)    Family History  Problem Relation Age of Onset  . Heart disease Father   . Hyperlipidemia Father     . Hypertension Father   . Heart disease Mother   . Diabetes Mother   . Hyperlipidemia Mother   . Hypertension Mother   . Varicose Veins Sister   . Cancer Brother    SOCIAL HISTORY: History  Substance Use Topics  . Smoking status: Current Every Day Smoker -- 0.50 packs/day for 30 years    Types: Cigarettes  . Smokeless tobacco: Never Used  . Alcohol Use: No   Allergies  Allergen Reactions  . Bactrim Diarrhea  . Isosorbide Mononitrate Other (See Comments)    Made patient feel as though she was about to have another MI   Current Outpatient Prescriptions  Medication Sig Dispense Refill  . aspirin EC 81 MG tablet Take 81 mg by mouth daily.      . ciprofloxacin (CIPRO) 500 MG tablet Take 500 mg by mouth 2 (two) times daily.      . fish oil-omega-3 fatty acids 1000 MG capsule Take 3 g by mouth daily.       Marland Kitchen glipiZIDE (GLUCOTROL) 5 MG tablet Take 5 mg by mouth daily.      . metFORMIN (GLUCOPHAGE) 500 MG tablet Take 500 mg by mouth 2 (two) times daily with a meal.      . metoprolol tartrate (LOPRESSOR) 25 MG tablet Take 1 tablet (25 mg total) by mouth 2 (two) times daily.  180 tablet  3  . niacin 500 MG CR capsule Take 1 capsule (500 mg total) by mouth at  bedtime.  30 capsule  6  . nitroGLYCERIN (NITROSTAT) 0.4 MG SL tablet Place 1 tablet (0.4 mg total) under the tongue every 5 (five) minutes as needed.  25 tablet  5   No current facility-administered medications for this visit.   REVIEW OF SYSTEMS: Arly.Keller ] denotes positive finding; [  ] denotes negative finding  CARDIOVASCULAR:   chest pain    chest pressure    palpitations    orthopnea    dyspnea on exertion   Arly.Keller ] claudication    rest pain    DVT    phlebitis PULMONARY:    productive cough    asthma    wheezing NEUROLOGIC:    weakness  Arly.Keller ] paresthesias   aphasia   amaurosis   dizziness HEMATOLOGIC:    bleeding problems    clotting disorders MUSCULOSKELETAL:   joint pain     joint swelling  leg swelling GASTROINTESTINAL:   blood in stool    hematemesis GENITOURINARY:    dysuria    hematuria PSYCHIATRIC:   history of major depression INTEGUMENTARY:   rashes   ulcers CONSTITUTIONAL:   fever    chills  PHYSICAL EXAM: Filed Vitals:   10/11/13 0940  BP: 130/68  Pulse: 64  Height:  (1.651 m)  Weight: 244 lb 4.8 oz (110.814 kg)  SpO2: 98%   Body mass index is 40.65 kg/(m^2). GENERAL: The patient is a morbidly obese  female, in no acute distress. The vital signs are documented above. CARDIOVASCULAR: There is a regular rate and rhythm. I do not detect carotid bruits. She has a palpable femoral pulse bilaterally. I cannot palpate popliteal or pedal pulses on either side. PULMONARY: There is good air exchange bilaterally without wheezing or rales. ABDOMEN: Soft and non-tender with normal pitched bowel sounds.  MUSCULOSKELETAL: she has had a previous right fifth toe amputation which has some eschar laterally on the foot. There is an ulcer on her adjacent right fourth toe which appears to go to the joint space. NEUROLOGIC: No focal weakness or paresthesias are detected. SKIN: There are no ulcers or rashes noted. PSYCHIATRIC: The patient has a normal affect.  DATA:  ARTERIOGRAPHY: She has no significant aortoiliac occlusive disease. On the right side, she has a long segment occlusion of the superficial femoral artery. There is reconstitution of the popliteal artery with two-vessel runoff via the anterior tibial and peroneal arteries.  I have independently interpreted her vein mapping. The right greater saphenous vein has diameters ranging from 0.35 cm-0.58 cm.  MEDICAL ISSUES: Atherosclerotic PVD with ulceration This patient has a limb threatening situation in the right lower extremity. She has a nonhealing wound with significant multilevel infrainguinal arterial occlusive disease. Given the long segment occlusion of the superficial  femoral artery, she is not a candidate for an endovascular approach. Her only option for revascularization is a right femoral to below knee popliteal artery bypass. I have reviewed the indications for lower extremity bypass. I have also reviewed the potential complications of surgery including but not limited to: wound healing problems, infection, graft thrombosis, limb loss, or other unpredictable medical problems. All the patient's questions were answered and they are agreeable to proceed. Given her morbid obesity and diabetes, certainly she is at increased risk for wound healing problems and infection. I've also discussed the option of  proceeding with toe amputation given that the wound appears to extend to the joint space. She would like to proceed with a right fourth toe amputation. Her surgery is scheduled for 10/19/2013.   VQI DATA: The patient is on aspirin but does not tolerate statins.  DICKSON,CHRISTOPHER S [facility] [place] Beeper: [phone]

## 2013-10-19 NOTE — Transfer of Care (Signed)
Immediate Anesthesia Transfer of Care Note  Patient: Christy [name]  Procedure(s) Performed: Procedure(s): RIGHT FEMORAL-BELOW KNEE POPLITEAL ARTERY BYPASS GRAFT (Right) AMPUTATION DIGIT-RIGHT 4TH TOE AMPUTATION (Right) INTRA OPERATIVE ARTERIOGRAM (Right)  Patient Location: PACU  Anesthesia Type:General  Level of Consciousness: awake, alert  and oriented  Airway & Oxygen Therapy: Patient Spontanous Breathing and Patient connected to face mask oxygen  Post-op Assessment: Report given to PACU RN, Post -op Vital signs reviewed and stable and Patient moving all extremities  Post vital signs: Reviewed and stable  Complications: No apparent anesthesia complications

## 2013-10-19 NOTE — Op Note (Signed)
NAME: Christy [name]   MRN: [id number] DOB: 08-17-1960    DATE OF OPERATION: 10/19/2013  PREOP DIAGNOSIS: critical limb ischemia right lower extremity  POSTOP DIAGNOSIS: same  PROCEDURE:  1. Right femoral to below knee popliteal artery bypass with non-reverse translocated saphenous vein graft 2. Intraoperative arteriogram 3. Ray amputation of the right fourth toe  SURGEON: Di Kindle. Edilia Bo, MD, FACS  ASSIST: Lianne Cure PA  ANESTHESIA: Gen.   EBL: minimal  INDICATIONS: Christy [name] is a 53 y.o. female who presented with a nonhealing wound on her left fourth toe. Arteriogram showed a superficial femoral artery occlusion and occluded posterior tibial artery.  FINDINGS: Two-vessel runoff via the anterior tibial and peroneal arteries.  TECHNIQUE: Patient was taken to the operating room and received a general anesthetic. The right lower extremity was prepped and draped in usual sterile fashion. Given her obesity the pannus had been taped superiorly. I elected to make the incision below the inguinal crease. This was done horizontally. Dissection was carried down to the common femoral artery. There was some plaque present but a good pulse anteriorly. I dissected well up under the inguinal ligament and really the plaque did not end at any point. The dissection was carried down to the bifurcation into the superficial femoral artery and deep femoral arteries. Through the same incision the saphenofemoral junction was dissected free. Patient had a bifid saphenous vein. I followed the medial branch. A separate longitudinal incision was made in the thigh and here the vein began to branch multiple times. I therefore followed the anterior branch. This appeared to be the dominant branch. Using 3 additional incisions along the medial aspect of the right leg the greater saphenous vein was harvested from the saphenofemoral junction to the mid calf. Branches were divided between clips and 3-0 silk  ties.  Through the distal incision, the below knee popliteal artery was exposed. The artery was somewhat small but patent. The common was then created from this incision to the groin incision. The patient was heparinized. The vein was ligated proximally and distally and then gently distended with heparinized saline. I used the vein in a non-reverse fashion. The proximal aspect of the vein was opened longitudinally and a valve sharply excised. The common femoral artery was then clamped proximally and distally and a longitudinal arteriotomy was made. The vein was sewn end to side to the artery using continuous 6-0 Prolene suture. Prior to completing the anastomosis the artery was backbled and flushed and there was excellent inflow. The anastomosis was then completed. Next using a retrograde Mills valvulotome the valves were sharply excised. There was excellent flow established to the graft which was then flushed with heparinized saline and clipped. It was marked with a twisting. It was then brought to the previously created tunnel for anastomosis to the below knee popliteal artery.  Tourniquet was placed on the thigh. The leg was exsanguinated with an Esmarch bandage and the tourniquet inflated to 300 mm of mercury. Under tourniquet control, a longitudinal arteriotomy was made in the below knee popliteal artery. The vein was cut the appropriate length, spatulated, and sewn end-to-side to the below knee popliteal artery using continuous 6-0 Prolene suture. Prior to completing the anastomosis the tourniquet was released. The artery was backbled and flushed properly and the anastomosis completed. Flow was reestablished to the right foot and was a good anterior tibial signal at the completion. A completion arteriogram was obtained. The vein was cannulated in the groin and arteriogram  was obtained. No technical problems were noted. 1:15 Blake drain was placed in the thigh incision. The groin incision was closed with  deep layer of 2-0 Vicryl in a subcutaneous layer of 2-0 Vicryl. This was done after hemostasis was obtained. The skin was closed with 40 subcutaneous stitch. The remaining incisions were closed with 2 deep layers of 3-0 Vicryl and the skin closed with 4-0 Vicryl. Dermabond was applied.  Attention was then turned to the right foot. An elliptical incision was made encompassing the right fourth toe. Dissection was carried down to the metatarsal head which was debrided back to healthy bone. The wound is irrigated with copious amounts of saline and hemostasis obtained. The wound was then closed with interrupted 4-0 nylon sutures. Sterile dressing was applied. The patient tolerated the procedure well was transferred to the recovery room in stable condition. All needle and sponge counts were correct.  Waverly Ferrari, MD, FACS [facility]  DATE OF DICTATION:   10/19/2013

## 2013-10-19 NOTE — Anesthesia Postprocedure Evaluation (Signed)
  Anesthesia Post-op Note  Patient: Christy [name]  Procedure(s) Performed: Procedure(s): RIGHT FEMORAL-BELOW KNEE POPLITEAL ARTERY BYPASS GRAFT (Right) AMPUTATION DIGIT-RIGHT 4TH TOE AMPUTATION (Right) INTRA OPERATIVE ARTERIOGRAM (Right)  Patient Location: PACU  Anesthesia Type:General  Level of Consciousness: awake, alert , oriented and patient cooperative  Airway and Oxygen Therapy: Patient Spontanous Breathing  Post-op Pain: moderate  Post-op Assessment: Post-op Vital signs reviewed, Patient's Cardiovascular Status Stable, Respiratory Function Stable, Patent Airway and No signs of Nausea or vomiting  Post-op Vital Signs: stable  Last Vitals:  Filed Vitals:   10/19/13 1249  BP: 98/53  Pulse: 87  Temp: 36.7 C  Resp: 18    Complications: No apparent anesthesia complications

## 2013-10-19 NOTE — Progress Notes (Addendum)
      Patient alert and oriented Doppler AT right LE Groin soft without hematoma  [name], [name] PA-C  Agree with above.  Brisk ATA signal with the doppler.  Waverly Ferrari, MD, FACS Beeper [phone] 10/19/2013

## 2013-10-20 ENCOUNTER — Encounter (HOSPITAL_COMMUNITY): Payer: Self-pay | Admitting: Vascular Surgery

## 2013-10-20 DIAGNOSIS — I739 Peripheral vascular disease, unspecified: Secondary | ICD-10-CM

## 2013-10-20 LAB — BASIC METABOLIC PANEL
Anion gap: 11 (ref 5–15)
BUN: 12 mg/dL (ref 6–23)
CO2: 24 mEq/L (ref 19–32)
Calcium: 8.3 mg/dL — ABNORMAL LOW (ref 8.4–10.5)
Chloride: 98 mEq/L (ref 96–112)
Creatinine, Ser: 1.19 mg/dL — ABNORMAL HIGH (ref 0.50–1.10)
GFR calc Af Amer: 59 mL/min — ABNORMAL LOW (ref >90)
GFR calc non Af Amer: 51 mL/min — ABNORMAL LOW (ref >90)
Glucose, Bld: 127 mg/dL — ABNORMAL HIGH (ref 70–99)
Potassium: 4.3 mEq/L (ref 3.7–5.3)
Sodium: 133 mEq/L — ABNORMAL LOW (ref 137–147)

## 2013-10-20 LAB — GLUCOSE, CAPILLARY
GLUCOSE-CAPILLARY: 129 mg/dL — AB (ref 70–99)
GLUCOSE-CAPILLARY: 118 mg/dL — AB (ref 70–99)
Glucose-Capillary: 144 mg/dL — ABNORMAL HIGH (ref 70–99)
Glucose-Capillary: 135 mg/dL — ABNORMAL HIGH (ref 70–99)

## 2013-10-20 LAB — HEMOGLOBIN A1C
Hgb A1c MFr Bld: 7.4 % — ABNORMAL HIGH (ref <5.7)
Mean Plasma Glucose: 166 mg/dL — ABNORMAL HIGH (ref <117)

## 2013-10-20 LAB — CBC
HCT: 33.7 % — ABNORMAL LOW (ref 36.0–46.0)
Hemoglobin: 11.2 g/dL — ABNORMAL LOW (ref 12.0–15.0)
MCH: 32.3 pg (ref 26.0–34.0)
MCHC: 33.2 g/dL (ref 30.0–36.0)
MCV: 97.1 fL (ref 78.0–100.0)
Platelets: 172 10*3/uL (ref 150–400)
RBC: 3.47 MIL/uL — AB (ref 3.87–5.11)
RDW: 14.1 % (ref 11.5–15.5)
WBC: 7.5 10*3/uL (ref 4.0–10.5)

## 2013-10-20 MED ORDER — MORPHINE SULFATE 2 MG/ML IJ SOLN: 1.0000 mg | INTRAMUSCULAR | Status: DC | PRN

## 2013-10-20 NOTE — Progress Notes (Signed)
   VASCULAR SURGERY ASSESSMENT & PLAN:  * 1 Day Post-Op s/p: right femoral to below knee popliteal artery bypass with a vein graft and amputation of the right fourth toe. The bypass graft is patent with brisk Doppler flow in the dorsalis pedis position. (The posterior tibial artery is occluded).  *  ID: Although there was no gross purulence, given her diabetes I will leave her on Zosyn for 48 hours and then she can be converted to po keflex.  * Physical therapy: Partial weight-bearing right foot he'll only. I have ordered a Darco shoe.  * Discontinue Foley  * Discontinue JP  * Lovenox for DVT prophylaxis  * Diabetes management: Blood sugars are under control.  * VQI: the patient is on aspirin and is on a statin. Tobacco cessation has been consulted. I have discussed the importance of tobacco cessation with her again today.  * Transfer to 2000.  * Anticipate discharge Sunday.  SUBJECTIVE: pain well controlled.  PHYSICAL EXAM: Filed Vitals:   10/19/13 2328 10/19/13 2330 10/20/13 0418 10/20/13 0422  BP:  94/47  99/59  Pulse:  65  69  Temp: 100.2 F (37.9 C)  98.8 F (37.1 C)   TempSrc: Oral  Oral   Resp:  15  17  Height:      Weight:      SpO2:  91%  93%   Brisk Doppler signal in right dorsalis pedis position. Incisions look fine. Toe amp site inspected and so far looks good.  JP 40 cc since OR.  LABS: Lab Results  Component Value Date   WBC 7.5 10/20/2013   HGB 11.2* 10/20/2013   HCT 33.7* 10/20/2013   MCV 97.1 10/20/2013   PLT 172 10/20/2013   Lab Results  Component Value Date   CREATININE 1.19* 10/20/2013   Lab Results  Component Value Date   INR 0.99 10/13/2013   CBG (last 3)   Recent Labs  10/19/13 0643 10/19/13 1305 10/19/13 2146  GLUCAP 157* 114* 112*    Active Problems:   PAD (peripheral artery disease)  Cari Caraway Beeper: [phone] 10/20/2013

## 2013-10-20 NOTE — Progress Notes (Signed)
Occupational Therapy Evaluation Patient Details Name: Christy [name] MRN: [id number] DOB: 22-Nov-1960 Today's Date: 10/20/2013    History of Present Illness Patient is a 53 yo female s/p s/p: right femoral to below knee popliteal artery bypass with a vein graft and amputation of the right fourth toe.   Clinical Impression   Pt close to baseline level for ADL and functional mobility @ RW level. Completed education regarding compensatory techniques, home safety and reducing risk of falls. Pt verbalized understanding. OT signing off. Thank you. Encourage pt to ambulate wit staff @ RW level.    Follow Up Recommendations  No OT follow up;Supervision - Intermittent    Equipment Recommendations  Other (comment) (pt declined DME recommendations)    Recommendations for Other Services       Precautions / Restrictions Precautions Precautions: Other (comment) Precaution Comments: NWBing toes (Right) Required Braces or Orthoses:  (Darko shoe) Restrictions Weight Bearing Restrictions: Yes RLE Weight Bearing: Non weight bearing (through toes, Darko shoe required)      Mobility Bed Mobility Overal bed mobility: Modified Independent                Transfers Overall transfer level: Modified independent               General transfer comment: increased time to perform    Balance Overall balance assessment: No apparent balance deficits (not formally assessed)                                          ADL Overall ADL's : Needs assistance/impaired                                     Functional mobility during ADLs: Modified independent (with Darco shoe) General ADL Comments: Educated pt on availibility of tub bench for bathing. Pt states she will just use her bucket. also discussed home set up to increase her independence and reduce risk of falls. Pt states her grandchildren will help with this. Discussed availability of AE for ADL. Pt verbalized  understanding.     Vision                     Perception     Praxis      Pertinent Vitals/Pain Pain Assessment: 0-10 Pain Score: 3  Pain Location: R foot Pain Descriptors / Indicators: Aching Pain Intervention(s): Limited activity within patient's tolerance;Monitored during session     Hand Dominance Right   Extremity/Trunk Assessment Upper Extremity Assessment Upper Extremity Assessment: Overall WFL for tasks assessed   Lower Extremity Assessment Lower Extremity Assessment: Defer to PT evaluation   Cervical / Trunk Assessment Cervical / Trunk Assessment: Normal   Communication Communication Communication: No difficulties   Cognition Arousal/Alertness: Awake/alert Behavior During Therapy: Flat affect Overall Cognitive Status: Within Functional Limits for tasks assessed                     General Comments       Exercises       Shoulder Instructions      Home Living Family/patient expects to be discharged to:: Private residence Living Arrangements: Alone Available Help at Discharge: Family;Friend(s) Type of Home: Mobile home Home Access: Stairs to enter Entrance Stairs-Number of Steps: 3 Entrance Stairs-Rails: None Home Layout: One level  Bathroom Shower/Tub: Therapist, sports characteristics: Engineer, building services: Standard Bathroom Accessibility: Yes How Accessible: Accessible via walker Home Equipment: Walker - 2 wheels;Cane - single point   Additional Comments: uses "5 gallon bucket " for shower bench      Prior Functioning/Environment Level of Independence: Independent;Independent with assistive device(s)             OT Diagnosis:     OT Problem List:     OT Treatment/Interventions:      OT Goals(Current goals can be found in the care plan section) Acute Rehab OT Goals Patient Stated Goal: to go home  OT Frequency:     Barriers to D/C:            Co-evaluation              End of  Session Nurse Communication: Mobility status  Activity Tolerance: Patient tolerated treatment well Patient left: in chair;with call bell/phone within reach;with family/visitor present   Time: 1735-1751 OT Time Calculation (min): 16 min Charges:  OT General Charges $OT Visit: 1 Procedure OT Evaluation $Initial OT Evaluation Tier I: 1 Procedure G-Codes:    Jakobie Henslee,HILLARY 10-23-2013, 5:52 PM   [facility], OTR/L  [phone] 10-23-13

## 2013-10-20 NOTE — Evaluation (Signed)
Physical Therapy Evaluation Patient Details Name: Christy [name] MRN: [id number] DOB: 08-22-1960 Today's Date: 10/20/2013   History of Present Illness  Patient is a 53 yo female s/p s/p: right femoral to below knee popliteal artery bypass with a vein graft and amputation of the right fourth toe.  Clinical Impression  Patient demonstrates deficits in functional mobility as indicated below. Will need continued skilled PT to address deficits and maximize function. Will see as indicated and progress as tolerated.     Follow Up Recommendations No PT follow up (recommend initial 24/7 assist for first few days at home)    Equipment Recommendations  None recommended by PT    Recommendations for Other Services       Precautions / Restrictions Precautions Precautions: Other (comment) Precaution Comments: NWBing toes (Right) Required Braces or Orthoses:  (Darko shoe) Restrictions Weight Bearing Restrictions: Yes RLE Weight Bearing: Non weight bearing (through toes, Darko shoe required)      Mobility  Bed Mobility               General bed mobility comments: pt received in chair  Transfers Overall transfer level: Modified independent               General transfer comment: increased time to perform  Ambulation/Gait Ambulation/Gait assistance: Supervision Ambulation Distance (Feet): 60 Feet Assistive device: Rolling walker (2 wheeled) Gait Pattern/deviations: Step-to pattern;Trunk flexed Gait velocity: decreased Gait velocity interpretation: Below normal speed for age/gender General Gait Details: patient limited by pain  Stairs            Wheelchair Mobility    Modified Rankin (Stroke Patients Only)       Balance Overall balance assessment: No apparent balance deficits (not formally assessed) (With use of RW)                                           Pertinent Vitals/Pain Pain Assessment: 0-10 Pain Score: 4  Pain Location: RLE  incision Pain Descriptors / Indicators: Sore    Home Living Family/patient expects to be discharged to:: Private residence Living Arrangements: Alone Available Help at Discharge: Family;Friend(s) Type of Home: Mobile home Home Access: Stairs to enter Entrance Stairs-Rails: None Entrance Stairs-Number of Steps: 3 Home Layout: One level Home Equipment: Walker - 2 wheels;Cane - single point      Prior Function Level of Independence: Independent               Hand Dominance   Dominant Hand: Right    Extremity/Trunk Assessment   Upper Extremity Assessment: Overall WFL for tasks assessed           Lower Extremity Assessment: RLE deficits/detail         Communication   Communication: No difficulties  Cognition Arousal/Alertness: Awake/alert Behavior During Therapy: Flat affect Overall Cognitive Status: Within Functional Limits for tasks assessed                      General Comments General comments (skin integrity, edema, etc.): educated patient on edema control and therapeutic breathing    Exercises        Assessment/Plan    PT Assessment Patient needs continued PT services  PT Diagnosis Difficulty walking;Abnormality of gait;Acute pain   PT Problem List Decreased strength;Decreased activity tolerance;Decreased mobility;Pain  PT Treatment Interventions DME instruction;Gait training;Stair training;Functional mobility training;Therapeutic activities;Therapeutic exercise;Balance training;Patient/family  education   PT Goals (Current goals can be found in the Care Plan section) Acute Rehab PT Goals Patient Stated Goal: to go home PT Goal Formulation: With patient Time For Goal Achievement: 11/03/13 Potential to Achieve Goals: Good    Frequency Min 3X/week   Barriers to discharge Decreased caregiver support      Co-evaluation               End of Session Equipment Utilized During Treatment: Gait belt;Oxygen Activity Tolerance: Patient  limited by fatigue;Patient limited by pain Patient left: in chair (in w/c for transport to new unit) Nurse Communication: Mobility status         Time: 1610-9604 PT Time Calculation (min): 18 min   Charges:   PT Evaluation $Initial PT Evaluation Tier I: 1 Procedure PT Treatments $Gait Training: 8-22 mins   PT G CodesFabio Asa 10/20/2013, 10:29 AM Charlotte Crumb, PT DPT  [phone]

## 2013-10-20 NOTE — Progress Notes (Signed)
Pt TX to 2W-38, VSS, d/c foley, JP, Pt worked w/PT. Called report.

## 2013-10-20 NOTE — Progress Notes (Signed)
VASCULAR LAB PRELIMINARY  ARTERIAL  ABI completed:    RIGHT    LEFT    PRESSURE WAVEFORM  PRESSURE WAVEFORM  BRACHIAL 122 triphasic BRACHIAL 127 triphasic  DP   DP    AT 140 monophasic AT 61 monophasic  PT 122 monophasic PT 79 monophasic  PER   PER    GREAT TOE  NA GREAT TOE  NA    RIGHT LEFT  ABI >1.0 0.62     Geraldina Parrott, RVT 10/20/2013, 1:46 PM

## 2013-10-21 LAB — GLUCOSE, CAPILLARY
Glucose-Capillary: 87 mg/dL (ref 70–99)
Glucose-Capillary: 143 mg/dL — ABNORMAL HIGH (ref 70–99)

## 2013-10-21 MED ORDER — OXYCODONE HCL 5 MG PO TABS: 5.0000 mg | ORAL_TABLET | Freq: Four times a day (QID) | ORAL | Status: DC | PRN

## 2013-10-21 MED ORDER — CEPHALEXIN 500 MG PO CAPS: 500.0000 mg | ORAL_CAPSULE | Freq: Three times a day (TID) | ORAL | Status: DC

## 2013-10-21 NOTE — Progress Notes (Signed)
Discharge education, medication, prescriptions, and follow-up appts given to pt and reviewed, pt stated understanding and that she had no questions, family aware of discharge Archie Balboa, RN

## 2013-10-21 NOTE — Discharge Summary (Signed)
Discharge Summary     Christy [name] [birth year] 53 y.o. female  [id number]  Admission Date: 10/19/2013  Discharge Date: 10/21/13  Physician: Chuck Hint, MD  Admission Diagnosis: Peripheral Vascular Disease   HPI:   This is a 53 y.o. female who is admitted for elective right femoral to below knee popliteal artery bypass grafting and amputation of the right fourth toe. I saw her in consultation on 10/05/2013. She had been referred by Dr. Mills Koller from the wound care center. She had undergone previous amputation of the right fifth toe by Dr. Nolen Mu. She subsequently developed a wound on her adjacent fourth toe which has been nonhealing. She has been treated with doxycycline. Her workup included a Doppler study which showed an ABI of 40% on the right and 64% on the left. Toe pressure on the right with 23 mm of mercury and toe pressure on the left was 47 mm of mercury.  She does describe bilateral lower extremity claudication. She denies any history of rest pain.  Her risk factors for peripheral vascular disease include diabetes, hypertension, hypercholesterolemia, and tobacco use. She has smoked a pack per day of cigarettes and she was 53 years old. We have had multiple discussions about the importance of tobacco cessation since her original visit.  Hospital Course:  The patient was admitted to the hospital and taken to the operating room on 10/19/2013 and underwent: 1. Right femoral to below knee popliteal artery bypass with non-reverse translocated saphenous vein graft  2. Intraoperative arteriogram  3. Ray amputation of the right fourth toe    The pt tolerated the procedure well and was transported to the PACU in good condition. By POD 1, she was doing well.  Her JP drain was removed and she was transferred to 2W.  It was discussed with her at length the importance of tobacco cessation.  By POD 2, her wounds are all c/d/i.  She has doppler signals in her right PT/AT.   Will order home health for wound checks and PT.  Had discussion with pt about keeping right groin wound clean and dry.  Instructions on AVS.  Post operative ABI's as follows:  RIGHT    LEFT     PRESSURE  WAVEFORM   PRESSURE  WAVEFORM   BRACHIAL  122  triphasic  BRACHIAL  127  triphasic   DP    DP     AT  140  monophasic  AT  61  monophasic   PT  122  monophasic  PT  79  monophasic   PER    PER     GREAT TOE   NA  GREAT TOE   NA     RIGHT  LEFT   ABI  >1.0  0.62      The remainder of the hospital course consisted of increasing mobilization and increasing intake of solids without difficulty.  CBC    Component Value Date/Time   WBC 7.5 10/20/2013 0312   RBC 3.47* 10/20/2013 0312   HGB 11.2* 10/20/2013 0312   HCT 33.7* 10/20/2013 0312   PLT 172 10/20/2013 0312   MCV 97.1 10/20/2013 0312   MCH 32.3 10/20/2013 0312   MCHC 33.2 10/20/2013 0312   RDW 14.1 10/20/2013 0312   LYMPHSABS 2.3 06/27/2010 1311   MONOABS 0.4 06/27/2010 1311   EOSABS 0.1 06/27/2010 1311   BASOSABS 0.0 06/27/2010 1311    BMET    Component Value Date/Time   NA 133* 10/20/2013 7829  K 4.3 10/20/2013 0312   CL 98 10/20/2013 0312   CO2 24 10/20/2013 0312   GLUCOSE 127* 10/20/2013 0312   BUN 12 10/20/2013 0312   CREATININE 1.19* 10/20/2013 0312   CREATININE 1.16* 07/14/2011 0921   CALCIUM 8.3* 10/20/2013 0312   GFRNONAA 51* 10/20/2013 0312   GFRAA 59* 10/20/2013 0312     Discharge Instructions:   The patient is discharged with extensive instructions on wound care and progressive ambulation.  They are instructed not to drive or perform any heavy lifting until returning to see the physician in his office.  Discharge Instructions   Call MD for:  redness, tenderness, or signs of infection (pain, swelling, bleeding, redness, odor or green/yellow discharge around incision site)    Complete by:  As directed      Call MD for:  severe or increased pain, loss or decreased feeling  in affected limb(s)    Complete by:  As directed      Call MD for:   temperature >100.5    Complete by:  As directed      Discharge instructions    Complete by:  As directed   Stop smoking.  If you need assistance, you may call 1-800-QUIT-NOW.     Discharge wound care:    Complete by:  As directed   Wash the groin wound with soap and water daily and pat dry. (No tub bath-only shower)  Then put a dry gauze or washcloth there to keep this area dry daily and as needed.  Do not use Vaseline or neosporin on your incisions.  Only use soap and water on your incisions and then protect and keep dry.     Driving Restrictions    Complete by:  As directed   No driving for 2 weeks     Lifting restrictions    Complete by:  As directed   No lifting for 4 weeks     Resume previous diet    Complete by:  As directed            Discharge Diagnosis:  Peripheral Vascular Disease  Secondary Diagnosis: Patient Active Problem List   Diagnosis Date Noted  . PAD (peripheral artery disease) 10/19/2013  . Atherosclerotic PVD with ulceration 10/05/2013  . Metabolic syndrome   . Morbid obesity with BMI of 40.0-44.9, adult 12/17/2011  . Hyperlipidemia 07/03/2011  . Diabetes mellitus, type 2   . Reactive depression (situational) 10/17/2010  . Hypertension 10/17/2010  . Tobacco abuse 10/17/2010  . Arteriosclerotic cardiovascular disease (ASCVD) 09/17/2010   Past Medical History  Diagnosis Date  . 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  . Tobacco abuse     60-90-pack-year history; 1/3 pack per day in 22-Jun-2011  . Hypertension   . Diabetes mellitus, type 2   . Depression 2010/06/22    Following sudden death of husband  . Metabolic syndrome     Elevated triglycerides, low HDL, fasting hyperglycemia with low-dose sulfonylurea  . Myocardial infarction 09/2010  . PONV (postoperative nausea and vomiting)   . Chronic kidney disease     recent UTI  . Arthritis   . Coronary artery disease        Medication List          aspirin EC 81 MG tablet  Take 81 mg by mouth daily.     atorvastatin 10 MG tablet  Commonly known as:  LIPITOR  Take 1 tablet (  10 mg total) by mouth daily.     fish oil-omega-3 fatty acids 1000 MG capsule  Take 3 g by mouth daily.     glipiZIDE 5 MG tablet  Commonly known as:  GLUCOTROL  Take 5 mg by mouth daily.     metFORMIN 500 MG tablet  Commonly known as:  GLUCOPHAGE  Take 500 mg by mouth 2 (two) times daily with a meal.     metoprolol tartrate 25 MG tablet  Commonly known as:  LOPRESSOR  Take 1 tablet (25 mg total) by mouth 2 (two) times daily.     niacin 500 MG CR capsule  Take 1 capsule (500 mg total) by mouth at bedtime.     nitroGLYCERIN 0.4 MG SL tablet  Commonly known as:  NITROSTAT  Place 1 tablet (0.4 mg total) under the tongue every 5 (five) minutes as needed.     oxyCODONE 5 MG immediate release tablet  Commonly known as:  ROXICODONE  Take 1 tablet (5 mg total) by mouth every 6 (six) hours as needed for severe pain.        Roxicodone #30 No Refill Keflex 500 mg tid x 7 days Rx given  Disposition: home with [facility]  Patient's condition: is Good  Follow up: 1. [name] in 2 weeks   Doreatha Massed, PA-C [facility] [phone] 10/21/2013  8:40 AM  - For VQI Registry use --- Instructions: Press F2 to tab through selections.  Delete question if not applicable.   Post-op:  Wound infection: No  Graft infection: No  Transfusion: No  If yes, n/a units given New Arrhythmia: No Ipsilateral amputation: No,  Minor,  BKA,  AKA Discharge patency: [ x] Primary,  Primary assisted,  Secondary,  Occluded Patency judged by:  Dopper only,  Palpable graft pulse,  Palpable distal pulse,  ABI inc. > 0.15,  Duplex Discharge ABI: R >1.0, L 0.62 Discharge TBI: R , L  D/C Ambulatory Status: Ambulatory with Assistance  Complications: MI: No,  Troponin only,  EKG or Clinical CHF: No Resp failure:No,   Pneumonia,  Ventilator Chg in renal function: No,  Inc. Cr > 0.5,  Temp. Dialysis,  Permanent dialysis Stroke: No,  Minor,  Major Return to OR: No  Reason for return to OR:  Bleeding,  Infection,  Thrombosis,  Revision  Discharge medications: Statin use:  yes ASA use:  yes Plavix use:  no Beta blocker use: yes Coumadin use: no

## 2013-10-21 NOTE — Progress Notes (Addendum)
  Progress Note    10/21/2013 8:33 AM 2 Days Post-Op  Subjective:  No complaints  Tm 99.4 now afebrile HR 60's-70's regular 90's-110's systolic 93% RA  Filed Vitals:   10/21/13 0830  BP: 111/61  Pulse: 68  Temp:   Resp:     Physical Exam: Cardiac:  regular Lungs:  Non labored Incisions:  All incisions are c/d/i; toe amp incision with nylon sutures in place Extremities:  + doppler signals right AT/PT  CBC    Component Value Date/Time   WBC 7.5 10/20/2013 0312   RBC 3.47* 10/20/2013 0312   HGB 11.2* 10/20/2013 0312   HCT 33.7* 10/20/2013 0312   PLT 172 10/20/2013 0312   MCV 97.1 10/20/2013 0312   MCH 32.3 10/20/2013 0312   MCHC 33.2 10/20/2013 0312   RDW 14.1 10/20/2013 0312   LYMPHSABS 2.3 06/27/2010 1311   MONOABS 0.4 06/27/2010 1311   EOSABS 0.1 06/27/2010 1311   BASOSABS 0.0 06/27/2010 1311    BMET    Component Value Date/Time   NA 133* 10/20/2013 0312   K 4.3 10/20/2013 0312   CL 98 10/20/2013 0312   CO2 24 10/20/2013 0312   GLUCOSE 127* 10/20/2013 0312   BUN 12 10/20/2013 0312   CREATININE 1.19* 10/20/2013 0312   CREATININE 1.16* 07/14/2011 0921   CALCIUM 8.3* 10/20/2013 0312   GFRNONAA 51* 10/20/2013 0312   GFRAA 59* 10/20/2013 0312    INR    Component Value Date/Time   INR 0.99 10/13/2013 1456     Intake/Output Summary (Last 24 hours) at 10/21/13 0833 Last data filed at 10/20/13 1811  Gross per 24 hour  Intake    480 ml  Output      0 ml  Net    480 ml     Assessment:  53 y.o. female is s/p:  1. Right femoral to below knee popliteal artery bypass with non-reverse translocated saphenous vein graft  2. Intraoperative arteriogram  3. Ray amputation of the right fourth toe  2 Days Post-Op  Plan: -pt doing well today with + doppler signals right foot.  -wants to go home -will order [facility] for wound checks -DVT prophylaxis:  lovenox   Doreatha Massed, PA-C [facility] [phone] 10/21/2013 8:33 AM    Details as above. Brisk DP doppler All incisions  healing Will d/c home  Fabienne Bruns, MD [facility] [place] Office: [phone] Pager: [phone]

## 2013-10-21 NOTE — Care Management Note (Signed)
    Page 1 of 1   10/21/2013     12:34:56 PM CARE MANAGEMENT NOTE 10/21/2013  Patient:  Christy Christy [name], Christy [name]   Account Number:  [id number]  Date Initiated:  10/21/2013  Documentation initiated by:  [facility]  Subjective/Objective Assessment:   adm: Peripheral Vascular Disease     Action/Plan:   discharge planning   Anticipated DC Date:  10/21/2013   Anticipated DC Plan:  [place]  CM consult      [facility] Choice  HOME HEALTH   Choice offered to / List presented to:  [name]        [place] arranged  HH-1 RN  [place].   Status of service:  Completed, signed off Medicare Important Message given?   (If response is "NO", the following Medicare IM given date fields will be blank) Date Medicare IM given:   Medicare IM given by:   Date Additional Medicare IM given:   Additional Medicare IM given by:    Discharge Disposition:  [place]  Per UR Regulation:    If discussed at Long Length of Stay Meetings, dates discussed:    Comments:  10/21/13 09:14 CM met with pt in room to offer choice of home health agency.  Pt chooses [facility] to render HHRN/aide. Address and contact informaiton verified with pt.  Cm called referral to [facility] rep, jaime.  No other CM needs were communicated.  Mariane Masters, BSn, [place].

## 2013-10-24 ENCOUNTER — Telehealth: Payer: Self-pay | Admitting: Vascular Surgery

## 2013-10-24 ENCOUNTER — Telehealth: Payer: Self-pay

## 2013-10-24 NOTE — Telephone Encounter (Signed)
Phone call from pt's [facility] RN; reported pt. has a firm hematoma right groin/ upper thigh that is approx. 3" in length x 2 " in width; stated it is raised like a "goose egg."   Reported the area is cool ; denies redness or warmth.  Denies any bleeding/ drainage.   Discussed with Dr. Arbie Cookey.  Recommended to bring pt. in to see [name] tomorrow, for evaluation.  Will notify pt.

## 2013-10-24 NOTE — Telephone Encounter (Signed)
Message copied by Fredrich Birks on Tue Oct 24, 2013 10:30 AM ------      Message from: Sharee Pimple      Created: Tue Oct 24, 2013  9:40 AM      Regarding: Schedule                   ----- Message -----         From: Dara Lords, PA-C         Sent: 10/21/2013   8:40 AM           To: [facility] Charge Pool            S/p 1. Right femoral to below knee popliteal artery bypass with non-reverse translocated saphenous vein graft        2. Intraoperative arteriogram        3. 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            F/u with [facility] in 2 weeks.            Thanks,      Lelon Mast ------

## 2013-10-24 NOTE — Discharge Summary (Signed)
Agree with plans for discharge.  Christopher Dickson, MD, FACS Beeper [phone] 10/24/2013  

## 2013-10-24 NOTE — Telephone Encounter (Signed)
Spoke with pt to schedule appt, dpm °

## 2013-10-25 ENCOUNTER — Ambulatory Visit: Payer: Managed Care, Other (non HMO) | Admitting: Vascular Surgery

## 2013-10-25 ENCOUNTER — Ambulatory Visit (INDEPENDENT_AMBULATORY_CARE_PROVIDER_SITE_OTHER): Payer: Self-pay | Admitting: Vascular Surgery

## 2013-10-25 ENCOUNTER — Encounter: Payer: Self-pay | Admitting: Vascular Surgery

## 2013-10-25 VITALS — BP 107/63 | HR 84 | Temp 98.5°F | Resp 18 | Ht 65.0 in | Wt 244.0 lb

## 2013-10-25 DIAGNOSIS — L98499 Non-pressure chronic ulcer of skin of other sites with unspecified severity: Secondary | ICD-10-CM

## 2013-10-25 DIAGNOSIS — I739 Peripheral vascular disease, unspecified: Secondary | ICD-10-CM

## 2013-10-25 DIAGNOSIS — I70209 Unspecified atherosclerosis of native arteries of extremities, unspecified extremity: Secondary | ICD-10-CM

## 2013-10-25 NOTE — Progress Notes (Signed)
POST OPERATIVE OFFICE NOTE    CC:  F/u for surgery  HPI:  This is a 53 y.o. female who is s/p right femoral to below knee popliteal artery bypass with non reversed translocated saphenous vein graft on 10/19/13.  She was discharged on 10/21/13.  Her hospital course was unremarkable.  She presents today with complaints of swelling in her thigh at one of her incisions.   She states that she has not had any fever or chills.    Allergies  Allergen Reactions  . Bactrim Diarrhea  . Isosorbide Mononitrate Other (See Comments)    Made patient feel as though she was about to have another MI    Current Outpatient Prescriptions  Medication Sig Dispense Refill  . aspirin EC 81 MG tablet Take 81 mg by mouth daily.      Marland Kitchen atorvastatin (LIPITOR) 10 MG tablet Take 1 tablet (10 mg total) by mouth daily.  90 tablet  3  . cephALEXin (KEFLEX) 500 MG capsule Take 1 capsule (500 mg total) by mouth 3 (three) times daily.  21 capsule  0  . fish oil-omega-3 fatty acids 1000 MG capsule Take 3 g by mouth daily.       Marland Kitchen glipiZIDE (GLUCOTROL) 5 MG tablet Take 5 mg by mouth daily.      . metFORMIN (GLUCOPHAGE) 500 MG tablet Take 500 mg by mouth 2 (two) times daily with a meal.      . metoprolol tartrate (LOPRESSOR) 25 MG tablet Take 1 tablet (25 mg total) by mouth 2 (two) times daily.  180 tablet  3  . niacin 500 MG CR capsule Take 1 capsule (500 mg total) by mouth at bedtime.  30 capsule  6  . nitroGLYCERIN (NITROSTAT) 0.4 MG SL tablet Place 1 tablet (0.4 mg total) under the tongue every 5 (five) minutes as needed.  25 tablet  5  . oxyCODONE (ROXICODONE) 5 MG immediate release tablet Take 1 tablet (5 mg total) by mouth every 6 (six) hours as needed for severe pain.  30 tablet  0   No current facility-administered medications for this visit.     ROS:  See HPI  Physical Exam:  Filed Vitals:   10/25/13 1449  BP: 107/63  Pulse: 84  Temp: 98.5 F (36.9 C)  Resp: 18    Incision:  Right groin incision is c/d/i;  saphenous vein harvest sites are also c/d/i and below knee incision is c/d/i and all are healing nicely.  Her toe amp is clean and dry with sutures in tact and healing nicely as well.  Extremities:  Right foot is warm with a 2+ palpable DP.  She does have some fullness at the most proximal vein harvest site.  There is no erythema   A/P:  This is a 53 y.o. female here for f/u for: 1. Right femoral to below knee popliteal artery bypass with non-reverse translocated saphenous vein graft  2. Intraoperative arteriogram  3. 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  She has a patent bypass graft with a palpable right DP pulse.   -she does have some fullness in the most proximal vein harvest site.  There is no erythema and she has not had any fever or chills.  This site does not look infected.  She was discharged from the hospital on Keflex and she will continue the course until finished.  -she has an appt to see [name] in a couple of weeks. -continue  HH wound care checks   Doreatha Massed, PA-C [facility] [phone]  Clinic MD:  Pt seen and examined with [name]

## 2013-10-31 DIAGNOSIS — Z0279 Encounter for issue of other medical certificate: Secondary | ICD-10-CM | POA: Diagnosis not present

## 2013-11-07 ENCOUNTER — Encounter: Payer: Self-pay | Admitting: Vascular Surgery

## 2013-11-08 ENCOUNTER — Encounter: Payer: Self-pay | Admitting: Vascular Surgery

## 2013-11-08 ENCOUNTER — Ambulatory Visit (INDEPENDENT_AMBULATORY_CARE_PROVIDER_SITE_OTHER): Payer: Self-pay | Admitting: Vascular Surgery

## 2013-11-08 VITALS — BP 117/71 | HR 62 | Temp 98.3°F | Ht 65.0 in | Wt 245.9 lb

## 2013-11-08 DIAGNOSIS — L98499 Non-pressure chronic ulcer of skin of other sites with unspecified severity: Secondary | ICD-10-CM

## 2013-11-08 DIAGNOSIS — I739 Peripheral vascular disease, unspecified: Secondary | ICD-10-CM

## 2013-11-08 DIAGNOSIS — I70209 Unspecified atherosclerosis of native arteries of extremities, unspecified extremity: Secondary | ICD-10-CM

## 2013-11-08 NOTE — Progress Notes (Signed)
  [facility] Post-Operative Office Note  11/08/2013 2:08 PM  CC: Surgery follow up  HPI:  Ms. Christy [name] is a 53 year old female who is s/p right right femoral to below knee popliteal artery bypass with non reversed translocated saphenous vein graft and ray amputation of right 4thon 10/19/13. She was last seen in the [facility] office on 10/25/13 due to complaints of swelling at the proximal saphenous incision site. There was no evidence of infection or drainage from the site. She had a palpable dorsalis pedis pulse. She was told to return in 2 weeks to see [name].  Today, she describes pain in the right groin. She notes some serous drainage from the area. Her upper thigh wound is still irritating but has improved since her last visit. She denies any pain in her feet or calves and has been ambulating well. She denies any fever or chills.   Filed Vitals:   11/08/13 1347  BP: 117/71  Pulse: 62  Temp: 98.3 F (36.8 C)    Physical Exam: Incisions:  Right groin incision open with yellow fibrinous exudate. No purulent drainage noted. Pink granulation tissue laterally. Remaining medial leg incisions are clean dry and intact. Some swelling around proximal saphenous vein harvest site. No drainage or evidence of infection noted. Right 4th toe amputation site clean and dry with sutures in place.  Extremities:  Foot is warm with 2+ edema.    Assessment:  53 y.o. female is s/p right femoral to below knee popliteal bypass with non-reversed translocated saphenous vein graft and ray amputation of the right 4th toe on 10/19/13  Plan: The right groin incision was open but without any evidence of infection. No debridement was necessary at this time. Instructed patient to apply wet to dry dressings to right groin twice daily. She was instructed to keep the area clean and encouraged to quit smoking to promote wound healing. The remainder of her incisions are clean and intact. Her amputation site is  healing well. She will follow up in 2 weeks with [name].    Maris Berger, PA-C [facility] Office: [phone] Pager: [phone] 11/08/2013 2:08 PM

## 2013-11-21 ENCOUNTER — Encounter: Payer: Self-pay | Admitting: Vascular Surgery

## 2013-11-22 ENCOUNTER — Encounter: Payer: Self-pay | Admitting: Vascular Surgery

## 2013-11-22 ENCOUNTER — Ambulatory Visit (INDEPENDENT_AMBULATORY_CARE_PROVIDER_SITE_OTHER): Payer: Self-pay | Admitting: Vascular Surgery

## 2013-11-22 VITALS — BP 136/56 | HR 62 | Resp 18 | Ht 66.0 in | Wt 251.6 lb

## 2013-11-22 DIAGNOSIS — L98499 Non-pressure chronic ulcer of skin of other sites with unspecified severity: Secondary | ICD-10-CM

## 2013-11-22 DIAGNOSIS — I739 Peripheral vascular disease, unspecified: Secondary | ICD-10-CM

## 2013-11-22 DIAGNOSIS — I70209 Unspecified atherosclerosis of native arteries of extremities, unspecified extremity: Secondary | ICD-10-CM

## 2013-11-22 DIAGNOSIS — L97519 Non-pressure chronic ulcer of other part of right foot with unspecified severity: Secondary | ICD-10-CM

## 2013-11-22 NOTE — Progress Notes (Signed)
     HPI: 53 y.o. female is s/p right femoral to below knee popliteal bypass with non-reversed translocated saphenous vein graft and ray amputation of the right 4th toe on 10/19/13.  She was seen on 11/08/2013 By Dr. Edilia Boickson.  The right groin incision was open but without any evidence of infection. No debridement was necessary at this time. Instructed patient to apply wet to dry dressings to right groin twice daily.   She now has a 1 cm superficial opening in the upper thigh.  She reports no drainage.    ROS:  No fever chills or night sweets  No N/V/D  No weakness    Objective 136/56 62   18     Right groin incisional wound without drainage or erythema Beefy red granulation tissue in wound base Upper thigh incision superficial opening 1 cm without erythema or drainage.  Yellow exudate at wound base.  Palpable DP pulse and well healed right foot s/p 4th toe amputation.  Assessment/Planning: s/p right femoral to below knee popliteal bypass with non-reversed translocated saphenous vein graft and ray amputation of the right 4th toe on 10/19/13  She is using iodaform gauze packing daily in the groin and has been washing the thigh wound with betadine daily.  We recommend guaze packing in both wounds wet to dry daily.  Using betadine will harm new cells so we asked her to stop using it.  She will wash with soap and water daily using Dial soap.    F/U in 4 weeks for a wound check and in 2 months for f/u ABI's and Arterial duplex right LE.      Clinton GallantCOLLINS, Carney Saxton [facility] CenterMA[facility]EN 11/22/2013 1:53 PM

## 2013-11-23 NOTE — Addendum Note (Signed)
Addended by: Adria DillELDRIDGE-LEWIS, Toni Hoffmeister L on: 11/23/2013 10:49 AM   Modules accepted: Orders

## 2014-01-02 ENCOUNTER — Encounter: Payer: Self-pay | Admitting: Vascular Surgery

## 2014-01-03 ENCOUNTER — Ambulatory Visit (INDEPENDENT_AMBULATORY_CARE_PROVIDER_SITE_OTHER): Payer: Self-pay | Admitting: Vascular Surgery

## 2014-01-03 ENCOUNTER — Encounter: Payer: Self-pay | Admitting: Vascular Surgery

## 2014-01-03 VITALS — BP 127/77 | HR 58 | Temp 98.1°F | Resp 18 | Ht 65.5 in | Wt 252.6 lb

## 2014-01-03 DIAGNOSIS — I70209 Unspecified atherosclerosis of native arteries of extremities, unspecified extremity: Secondary | ICD-10-CM

## 2014-01-03 DIAGNOSIS — I739 Peripheral vascular disease, unspecified: Secondary | ICD-10-CM

## 2014-01-03 DIAGNOSIS — I2510 Atherosclerotic heart disease of native coronary artery without angina pectoris: Secondary | ICD-10-CM

## 2014-01-03 DIAGNOSIS — L98499 Non-pressure chronic ulcer of skin of other sites with unspecified severity: Secondary | ICD-10-CM

## 2014-01-03 NOTE — Assessment & Plan Note (Signed)
The patient is doing well status post right femoral to below knee popliteal artery bypass with a vein graft. The graft is patent and her toe amputation site has healed. We will see her back in December when she comes in for her 3 month recall studies. She knows to call sooner if she has problems.

## 2014-01-03 NOTE — Progress Notes (Signed)
   Patient name: Christy StackKathy S Eley MRN: [id number] DOB: November 17, 1960 Sex: female  REASON FOR VISIT: follow up after right lower extremity bypass and right fourth toe amputation.  HPI: Christy [name] is a 53 y.o. female who presented with a nonhealing wound of her left fourth toe. She had significant infrainguinal arterial occlusive disease. On 10/19/2013, she underwent a right femoral to below knee popliteal artery bypass with a vein graft and radial amputation of the right fourth toe. She comes in for a routine follow up visit.  She has no specific complaints and the toe amputation site has healed. She had 1 small wound in her right medial thigh which she is packing and this is improving quickly.  REVIEW OF SYSTEMS: Arly.Keller[X ] denotes positive finding; [  ] denotes negative finding  CARDIOVASCULAR:  [ ]  chest pain   [ ]  dyspnea on exertion    CONSTITUTIONAL:  [ ]  fever   [ ]  chills  PHYSICAL EXAM: Filed Vitals:   01/03/14 0851  BP: 127/77  Pulse: 58  Temp: 98.1 F (36.7 C)  TempSrc: Oral  Resp: 18  Height: 5' 5.5" (1.664 m)  Weight: 252 lb 9.6 oz (114.579 kg)  SpO2: 96%   Body mass index is 41.38 kg/(m^2). GENERAL: The patient is a well-nourished female, in no acute distress. The vital signs are documented above. CARDIOVASCULAR: There is a regular rate and rhythm. PULMONARY: There is good air exchange bilaterally without wheezing or rales. The right foot is warm and well perfused. The thigh wound is healing nicely. The toe amputation site has healed.  MEDICAL ISSUES: Atherosclerotic PVD with ulceration The patient is doing well status post right femoral to below knee popliteal artery bypass with a vein graft. The graft is patent and her toe amputation site has healed. We will see her back in December when she comes in for her 3 month recall studies. She knows to call sooner if she has problems.   Estephania Licciardi S [facility] [place] Beeper: [phone]

## 2014-01-20 ENCOUNTER — Emergency Department (HOSPITAL_COMMUNITY)
Admission: EM | Admit: 2014-01-20 | Discharge: 2014-01-20 | Disposition: A | Discharge status: Home / Self Care | Payer: Managed Care, Other (non HMO) | Attending: Emergency Medicine | Admitting: Emergency Medicine

## 2014-01-20 ENCOUNTER — Encounter (HOSPITAL_COMMUNITY): Payer: Self-pay | Admitting: Emergency Medicine

## 2014-01-20 DIAGNOSIS — Z792 Long term (current) use of antibiotics: Secondary | ICD-10-CM | POA: Insufficient documentation

## 2014-01-20 DIAGNOSIS — N189 Chronic kidney disease, unspecified: Secondary | ICD-10-CM | POA: Diagnosis not present

## 2014-01-20 DIAGNOSIS — Z8659 Personal history of other mental and behavioral disorders: Secondary | ICD-10-CM | POA: Diagnosis not present

## 2014-01-20 DIAGNOSIS — E119 Type 2 diabetes mellitus without complications: Secondary | ICD-10-CM | POA: Insufficient documentation

## 2014-01-20 DIAGNOSIS — I2510 Atherosclerotic heart disease of native coronary artery without angina pectoris: Secondary | ICD-10-CM | POA: Insufficient documentation

## 2014-01-20 DIAGNOSIS — N390 Urinary tract infection, site not specified: Secondary | ICD-10-CM | POA: Diagnosis not present

## 2014-01-20 DIAGNOSIS — Z720 Tobacco use: Secondary | ICD-10-CM | POA: Diagnosis not present

## 2014-01-20 DIAGNOSIS — Z7982 Long term (current) use of aspirin: Secondary | ICD-10-CM | POA: Diagnosis not present

## 2014-01-20 DIAGNOSIS — Z9861 Coronary angioplasty status: Secondary | ICD-10-CM | POA: Diagnosis not present

## 2014-01-20 DIAGNOSIS — I129 Hypertensive chronic kidney disease with stage 1 through stage 4 chronic kidney disease, or unspecified chronic kidney disease: Secondary | ICD-10-CM | POA: Diagnosis not present

## 2014-01-20 DIAGNOSIS — M1990 Unspecified osteoarthritis, unspecified site: Secondary | ICD-10-CM | POA: Diagnosis not present

## 2014-01-20 DIAGNOSIS — R109 Unspecified abdominal pain: Secondary | ICD-10-CM | POA: Diagnosis present

## 2014-01-20 LAB — URINE MICROSCOPIC-ADD ON

## 2014-01-20 LAB — URINALYSIS, ROUTINE W REFLEX MICROSCOPIC
Bilirubin Urine: NEGATIVE
Glucose, UA: NEGATIVE mg/dL
Ketones, ur: NEGATIVE mg/dL
Nitrite: NEGATIVE
Protein, ur: NEGATIVE mg/dL
SPECIFIC GRAVITY, URINE: 1.01 (ref 1.005–1.030)
UROBILINOGEN UA: 0.2 mg/dL (ref 0.0–1.0)
pH: 6 (ref 5.0–8.0)

## 2014-01-20 LAB — CBG MONITORING, ED: Glucose-Capillary: 142 mg/dL — ABNORMAL HIGH (ref 70–99)

## 2014-01-20 MED ORDER — CIPROFLOXACIN HCL 250 MG PO TABS
500.0000 mg | ORAL_TABLET | Freq: Once | ORAL | Status: AC
  Administered 2014-01-20: 500 mg via ORAL
  Filled 2014-01-20: qty 2

## 2014-01-20 MED ORDER — CIPROFLOXACIN HCL 500 MG PO TABS: 500.0000 mg | ORAL_TABLET | Freq: Two times a day (BID) | ORAL | Status: DC

## 2014-01-20 NOTE — Discharge Instructions (Signed)
Follow up with your md after you finish the antibiotics °

## 2014-01-20 NOTE — ED Provider Notes (Signed)
CSN: [id number]     Arrival date & time 01/20/14  07/19/1630 History  This chart was scribed for Benny Lennert, MD by Ronney Lion, ED Scribe. This patient was seen in room APA07/APA07 and the patient's care was started at 5:15 PM.    Chief Complaint  Patient presents with  . Flank Pain    Patient is a 53 y.o. female presenting with flank pain. The history is provided by the patient. No language interpreter was used.  Flank Pain This is a recurrent problem. The current episode started more than 2 days ago. The problem occurs constantly. The problem has been gradually improving. Pertinent negatives include no chest pain, no abdominal pain and no headaches. Nothing aggravates the symptoms. Nothing relieves the symptoms. She has tried nothing for the symptoms.     HPI Comments: Christy [name] is a 53 y.o. female who presents to the Emergency Department complaining of a possible kidney and bladder infection. She says that she has had a kidney infection since 3 days ago. She complains of associated sore, left flank pain that began 3 days ago but that gradually improved since then. She also complains of associated urine cloudiness and burning urination. Her last infection occurred a month ago, and it was treated with Cipro. She denies abdominal pain, fever, and chills.    Past Medical History  Diagnosis Date  . 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  . Tobacco abuse     60-90-pack-year history; 1/3 pack per day in Jul 19, 2011  . Hypertension   . Diabetes mellitus, type 2   . Depression 07/19/10    Following sudden death of husband  . Metabolic syndrome     Elevated triglycerides, low HDL, fasting hyperglycemia with low-dose sulfonylurea  . Myocardial infarction 09/2010  . PONV (postoperative nausea and vomiting)   . Chronic kidney disease     recent UTI  . Arthritis   . Coronary artery disease    Past Surgical History  Procedure Laterality Date  .  Coronary angioplasty with stent placement  10/01/10    DES x2  . Cholecystectomy    . Bilateral oophorectomy  2009-07-18    for benign mass  . Metatarsal head excision Right 11/03/2012    Procedure: AMPUTATION OF RIGHT FIFTH TOE AND PORTION OF FIFTH METATARSAL;  Surgeon: Dallas Schimke, DPM;  Location: [facility];  Service: Orthopedics;  Laterality: Right;  . Incision and drainage of wound Right 11/24/2012    Procedure: DEBRIDEMENT WOUND FOOT;  Surgeon: Dallas Schimke, DPM;  Location: [facility];  Service: Orthopedics;  Laterality: Right;  . Application of wound vac Right 11/24/2012    Procedure: APPLICATION OF WOUND VAC;  Surgeon: Dallas Schimke, DPM;  Location: [facility];  Service: Orthopedics;  Laterality: Right;  . Toe amputation    . Incision and drainage of wound Right 03/23/2013    Procedure: DEBRIDEMENT OF INFECTED BONE 5TH METATARSAL RIGHT FOOT;  Surgeon: Dallas Schimke, DPM;  Location: [facility];  Service: Orthopedics;  Laterality: Right;  . Breast surgery Left     brest biopsy  . Femoral-popliteal bypass graft Right 10/19/2013    Procedure: RIGHT FEMORAL-BELOW KNEE POPLITEAL ARTERY BYPASS GRAFT;  Surgeon: Chuck Hint, MD;  Location: [facility] OR;  Service: Vascular;  Laterality: Right;  . Amputation Right 10/19/2013    Procedure: AMPUTATION DIGIT-RIGHT 4TH TOE AMPUTATION;  Surgeon: Chuck Hint, MD;  Location: [facility] OR;  Service: Vascular;  Laterality: Right;  . Intraoperative arteriogram Right 10/19/2013    Procedure: INTRA OPERATIVE ARTERIOGRAM;  Surgeon: Chuck Hinthristopher S Dickson, MD;  [facility];  Service: Vascular;  Laterality: Right;   Family History  Problem Relation Age of Onset  . Heart disease Father   . Hyperlipidemia Father   . Hypertension Father   . Heart disease Mother   . Diabetes Mother   . Hyperlipidemia Mother   . Hypertension Mother   . Varicose Veins Sister   . Cancer Brother    History  Substance Use Topics  . Smoking status: Current  Some Day Smoker -- 0.25 packs/day for 30 years    Types: Cigarettes    Start date: 10/18/1983  . Smokeless tobacco: Never Used  . Alcohol Use: No   OB History    No data available     Review of Systems  Constitutional: Negative for appetite change and fatigue.  HENT: Negative for congestion, ear discharge and sinus pressure.   Eyes: Negative for discharge.  Respiratory: Negative for cough.   Cardiovascular: Negative for chest pain.  Gastrointestinal: Negative for abdominal pain and diarrhea.  Genitourinary: Positive for dysuria and flank pain. Negative for frequency and hematuria.  Musculoskeletal: Negative for back pain.  Skin: Negative for rash.  Neurological: Negative for seizures and headaches.  Psychiatric/Behavioral: Negative for hallucinations.      Allergies  Bactrim and Isosorbide mononitrate  Home Medications   Prior to Admission medications   Medication Sig Start Date End Date Taking? Authorizing Provider  aspirin EC 81 MG tablet Take 81 mg by mouth daily.    Historical Provider, MD  atorvastatin (LIPITOR) 10 MG tablet Take 1 tablet (10 mg total) by mouth daily. 10/17/13   Laqueta LindenSuresh A Koneswaran, MD  cephALEXin (KEFLEX) 500 MG capsule Take 1 capsule (500 mg total) by mouth 3 (three) times daily. 10/21/13   Samantha J Rhyne, PA-C  fish oil-omega-3 fatty acids 1000 MG capsule Take 3 g by mouth daily.     Historical Provider, MD  glipiZIDE (GLUCOTROL) 5 MG tablet Take 5 mg by mouth daily.    Historical Provider, MD  metFORMIN (GLUCOPHAGE) 500 MG tablet Take 500 mg by mouth 2 (two) times daily with a meal.    Historical Provider, MD  metoprolol tartrate (LOPRESSOR) 25 MG tablet Take 1 tablet (25 mg total) by mouth 2 (two) times daily. 07/20/12   Kathlen Brunswickobert M Rothbart, MD  niacin 500 MG CR capsule Take 1 capsule (500 mg total) by mouth at bedtime. 12/25/11   Dyann KiefMichele M Lenze, PA-C  nitroGLYCERIN (NITROSTAT) 0.4 MG SL tablet Place 1 tablet (0.4 mg total) under the tongue every 5 (five)  minutes as needed. 07/20/12   Kathlen Brunswickobert M Rothbart, MD  oxyCODONE (ROXICODONE) 5 MG immediate release tablet Take 1 tablet (5 mg total) by mouth every 6 (six) hours as needed for severe pain. 10/21/13   Samantha J Rhyne, PA-C   BP 133/66 mmHg  Pulse 58  Temp(Src) 98.2 F (36.8 C)  Resp 18  Ht 5\' 5"  (1.651 m)  Wt 250 lb (113.399 kg)  BMI 41.60 kg/m2  SpO2 97% Physical Exam  Constitutional: She is oriented to person, place, and time. She appears well-developed.  HENT:  Head: Normocephalic.  Eyes: Conjunctivae and EOM are normal. No scleral icterus.  Neck: Neck supple. No thyromegaly present.  Cardiovascular: Normal rate and regular rhythm.  Exam reveals no gallop and no friction rub.   No murmur heard. Pulmonary/Chest: No stridor.  She has no wheezes. She has no rales. She exhibits no tenderness.  Abdominal: She exhibits no distension. There is tenderness. There is no rebound.  Very minimal left flank tenderness.   Musculoskeletal: Normal range of motion. She exhibits no edema.  Lymphadenopathy:    She has no cervical adenopathy.  Neurological: She is oriented to person, place, and time. She exhibits normal muscle tone. Coordination normal.  Skin: No rash noted. No erythema.  Psychiatric: She has a normal mood and affect. Her behavior is normal.  Nursing note and vitals reviewed.   ED Course  Procedures (including critical care time)  DIAGNOSTIC STUDIES: Oxygen Saturation is 97% on room air, normal by my interpretation.    COORDINATION OF CARE: 5:16 PM - Discussed treatment plan with pt at bedside which includes a urine test and pt agreed to plan.  Labs Review Labs Reviewed - No data to display  Imaging Review No results found.   EKG Interpretation None      MDM   Final diagnoses:  None   uti   tx cipro  The chart was scribed for me under my direct supervision.  I personally performed the history, physical, and medical decision making and all procedures in the  evaluation of this patient.Benny Lennert.   Kenechukwu Eckstein L Valeda Corzine, MD 01/20/14 [phone]

## 2014-01-20 NOTE — ED Notes (Signed)
Pt c/o left flank pain since Wednesday with dysuria today.

## 2014-01-23 ENCOUNTER — Encounter: Payer: Self-pay | Admitting: Vascular Surgery

## 2014-01-23 LAB — URINE CULTURE

## 2014-01-24 ENCOUNTER — Ambulatory Visit (INDEPENDENT_AMBULATORY_CARE_PROVIDER_SITE_OTHER): Payer: Self-pay | Admitting: Vascular Surgery

## 2014-01-24 ENCOUNTER — Ambulatory Visit (INDEPENDENT_AMBULATORY_CARE_PROVIDER_SITE_OTHER)
Admission: RE | Admit: 2014-01-24 | Discharge: 2014-01-24 | Disposition: A | Discharge status: Home / Self Care | Payer: Managed Care, Other (non HMO) | Source: Ambulatory Visit | Attending: Vascular Surgery | Admitting: Vascular Surgery

## 2014-01-24 ENCOUNTER — Other Ambulatory Visit: Payer: Self-pay | Admitting: Vascular Surgery

## 2014-01-24 ENCOUNTER — Encounter: Payer: Self-pay | Admitting: Vascular Surgery

## 2014-01-24 ENCOUNTER — Ambulatory Visit (HOSPITAL_COMMUNITY)
Admission: RE | Admit: 2014-01-24 | Discharge: 2014-01-24 | Disposition: A | Discharge status: Home / Self Care | Payer: Managed Care, Other (non HMO) | Source: Ambulatory Visit | Attending: Vascular Surgery | Admitting: Vascular Surgery

## 2014-01-24 ENCOUNTER — Telehealth (HOSPITAL_COMMUNITY): Payer: Self-pay

## 2014-01-24 VITALS — BP 140/71 | HR 62 | Ht 65.0 in | Wt 253.0 lb

## 2014-01-24 DIAGNOSIS — I739 Peripheral vascular disease, unspecified: Secondary | ICD-10-CM

## 2014-01-24 DIAGNOSIS — I70209 Unspecified atherosclerosis of native arteries of extremities, unspecified extremity: Secondary | ICD-10-CM

## 2014-01-24 DIAGNOSIS — L98499 Non-pressure chronic ulcer of skin of other sites with unspecified severity: Principal | ICD-10-CM

## 2014-01-24 DIAGNOSIS — L97909 Non-pressure chronic ulcer of unspecified part of unspecified lower leg with unspecified severity: Secondary | ICD-10-CM

## 2014-01-24 DIAGNOSIS — L97519 Non-pressure chronic ulcer of other part of right foot with unspecified severity: Secondary | ICD-10-CM

## 2014-01-24 DIAGNOSIS — Z48812 Encounter for surgical aftercare following surgery on the circulatory system: Secondary | ICD-10-CM

## 2014-01-24 DIAGNOSIS — I70239 Atherosclerosis of native arteries of right leg with ulceration of unspecified site: Secondary | ICD-10-CM | POA: Diagnosis present

## 2014-01-24 DIAGNOSIS — I70299 Other atherosclerosis of native arteries of extremities, unspecified extremity: Secondary | ICD-10-CM

## 2014-01-24 NOTE — Addendum Note (Signed)
Addended by: Sharee PimpleMCCHESNEY, MARILYN K on: 01/24/2014 04:52 PM   Modules accepted: Orders

## 2014-01-24 NOTE — Telephone Encounter (Signed)
Post ED Visit - Positive Culture Follow-up  Culture report reviewed by antimicrobial stewardship pharmacist: []  Wes Dulaney, Pharm.D., BCPS [x]  Celedonio MiyamotoJeremy Frens, Pharm.D., BCPS []  Georgina PillionElizabeth Martin, [address] BCPS []  IndustryMinh Pham, [address]Ph[address]D., BCPS, AAHIVP []  Estella HuskMichelle Turner, Pharm.D., BCPS, AAHIVP []  Babs BertinHaley Baird, Pharm.D.  Positive Urine culture, >/= 100,000 colonies -> Enterobacter Cloacae Treated with Ciprofloxacin, organism sensitive to the same and no further patient follow-up is required at this time.   Arvid RightClark, Joyanna Kleman Dorn 01/24/2014, 4:33 PM

## 2014-01-24 NOTE — Progress Notes (Signed)
   Patient name: Christy [name] MRN: [id number] DOB: 1960/12/15 Sex: female  REASON FOR VISIT: Follow up of right fourth toe amputation.  HPI: Christy [name] is a 53 y.o. female who had presented with a nonhealing wound of her left fourth toe. On 10/19/2013, she underwent a right femoral to below knee popliteal artery bypass with vein graft and ray amputation of the right fourth toe. When I last saw her on 01/03/2014, the right foot was warm and well-perfused. The toe amputation site had healed. She comes in for a 3 month follow up visit.  Since I saw her last she has no specific complaints. She had one wound or medial 5 which is almost completely healed. She denies fever or chills. She is no longer smoking.  REVIEW OF SYSTEMS: Arly.Keller[X ] denotes positive finding; [  ] denotes negative finding  CARDIOVASCULAR:  [ ]  chest pain   [ ]  dyspnea on exertion    CONSTITUTIONAL:  [ ]  fever   [ ]  chills  PHYSICAL EXAM: Filed Vitals:   01/24/14 1440  BP: 140/71  Pulse: 62  Height: 5\' 5"  (1.651 m)  Weight: 253 lb (114.76 kg)  SpO2: 96%   Body mass index is 42.1 kg/(m^2). GENERAL: The patient is a well-nourished female, in no acute distress. The vital signs are documented above. CARDIOVASCULAR: There is a regular rate and rhythm. PULMONARY: There is good air exchange bilaterally without wheezing or rales. Her wounds have all healed except for the proximal thigh which takes approximately a centimeter of quarter inch Nu Gauze. The right foot is warm and well-perfused.  I have independently interpreted her arterial duplex scan today which shows triphasic waveforms throughout her graft. There are no areas of significantly increased velocities except at the proximal anastomosis where peak systolic velocity is 250 cm/[name]. ABIs 87% up from 40% preoperatively.  MEDICAL ISSUES: The patient is doing well status post right femoral to below knee popliteal artery bypass graft with vein and amputation of the fourth toe.  I've ordered a follow up duplex and ABIs in 6 months and I'll see her back at that time. She knows to call sooner if she has problems.  [name] [facility] [place] Beeper: [phone]

## 2014-01-25 ENCOUNTER — Encounter (HOSPITAL_COMMUNITY): Payer: Self-pay | Admitting: Vascular Surgery

## 2014-02-07 LAB — LIPID PANEL
CHOLESTEROL: 149 mg/dL (ref 0–200)
HDL: 35 mg/dL — ABNORMAL LOW (ref >39)
LDL Cholesterol: 61 mg/dL (ref 0–99)
Total CHOL/HDL Ratio: 4.3 Ratio
Triglycerides: 267 mg/dL — ABNORMAL HIGH (ref <150)
VLDL: 53 mg/dL — ABNORMAL HIGH (ref 0–40)

## 2014-02-07 LAB — HEPATIC FUNCTION PANEL
ALK PHOS: 97 U/L (ref 39–117)
ALT: 16 U/L (ref 0–35)
AST: 18 U/L (ref 0–37)
Albumin: 3.9 g/dL (ref 3.5–5.2)
BILIRUBIN DIRECT: 0.1 mg/dL (ref 0.0–0.3)
Indirect Bilirubin: 0.4 mg/dL (ref 0.2–1.2)
TOTAL PROTEIN: 8.1 g/dL (ref 6.0–8.3)
Total Bilirubin: 0.5 mg/dL (ref 0.2–1.2)

## 2014-03-10 ENCOUNTER — Encounter (HOSPITAL_COMMUNITY): Payer: Self-pay

## 2014-03-10 ENCOUNTER — Emergency Department (HOSPITAL_COMMUNITY)
Admission: EM | Admit: 2014-03-10 | Discharge: 2014-03-10 | Disposition: A | Discharge status: Home / Self Care | Payer: Managed Care, Other (non HMO) | Attending: Emergency Medicine | Admitting: Emergency Medicine

## 2014-03-10 DIAGNOSIS — Z9861 Coronary angioplasty status: Secondary | ICD-10-CM | POA: Diagnosis not present

## 2014-03-10 DIAGNOSIS — Z79899 Other long term (current) drug therapy: Secondary | ICD-10-CM | POA: Diagnosis not present

## 2014-03-10 DIAGNOSIS — N189 Chronic kidney disease, unspecified: Secondary | ICD-10-CM | POA: Insufficient documentation

## 2014-03-10 DIAGNOSIS — I2510 Atherosclerotic heart disease of native coronary artery without angina pectoris: Secondary | ICD-10-CM | POA: Insufficient documentation

## 2014-03-10 DIAGNOSIS — N390 Urinary tract infection, site not specified: Secondary | ICD-10-CM | POA: Insufficient documentation

## 2014-03-10 DIAGNOSIS — Z792 Long term (current) use of antibiotics: Secondary | ICD-10-CM | POA: Diagnosis not present

## 2014-03-10 DIAGNOSIS — Z8659 Personal history of other mental and behavioral disorders: Secondary | ICD-10-CM | POA: Insufficient documentation

## 2014-03-10 DIAGNOSIS — I129 Hypertensive chronic kidney disease with stage 1 through stage 4 chronic kidney disease, or unspecified chronic kidney disease: Secondary | ICD-10-CM | POA: Insufficient documentation

## 2014-03-10 DIAGNOSIS — Z720 Tobacco use: Secondary | ICD-10-CM | POA: Diagnosis not present

## 2014-03-10 DIAGNOSIS — I252 Old myocardial infarction: Secondary | ICD-10-CM | POA: Insufficient documentation

## 2014-03-10 DIAGNOSIS — E119 Type 2 diabetes mellitus without complications: Secondary | ICD-10-CM | POA: Diagnosis not present

## 2014-03-10 DIAGNOSIS — R300 Dysuria: Secondary | ICD-10-CM | POA: Diagnosis present

## 2014-03-10 DIAGNOSIS — M1990 Unspecified osteoarthritis, unspecified site: Secondary | ICD-10-CM | POA: Insufficient documentation

## 2014-03-10 LAB — URINE MICROSCOPIC-ADD ON

## 2014-03-10 LAB — URINALYSIS, ROUTINE W REFLEX MICROSCOPIC
Bilirubin Urine: NEGATIVE
Glucose, UA: NEGATIVE mg/dL
Ketones, ur: NEGATIVE mg/dL
Nitrite: NEGATIVE
Protein, ur: 30 mg/dL — AB
Specific Gravity, Urine: 1.02 (ref 1.005–1.030)
UROBILINOGEN UA: 0.2 mg/dL (ref 0.0–1.0)
pH: 6 (ref 5.0–8.0)

## 2014-03-10 MED ORDER — ONDANSETRON HCL 4 MG PO TABS
4.0000 mg | ORAL_TABLET | Freq: Once | ORAL | Status: AC
  Administered 2014-03-10: 4 mg via ORAL
  Filled 2014-03-10: qty 1

## 2014-03-10 MED ORDER — PHENAZOPYRIDINE HCL 100 MG PO TABS
100.0000 mg | ORAL_TABLET | Freq: Once | ORAL | Status: AC
  Administered 2014-03-10: 100 mg via ORAL
  Filled 2014-03-10: qty 1

## 2014-03-10 MED ORDER — CIPROFLOXACIN HCL 500 MG PO TABS: 500.0000 mg | ORAL_TABLET | Freq: Two times a day (BID) | ORAL | Status: DC

## 2014-03-10 MED ORDER — CIPROFLOXACIN HCL 250 MG PO TABS
500.0000 mg | ORAL_TABLET | Freq: Once | ORAL | Status: AC
  Administered 2014-03-10: 500 mg via ORAL
  Filled 2014-03-10: qty 2

## 2014-03-10 NOTE — Discharge Instructions (Signed)

## 2014-03-10 NOTE — ED Notes (Signed)
Pt reports cloudy urine and burning with urination since yesterday.  Denies fever, abd pain, or back pain.

## 2014-03-10 NOTE — ED Provider Notes (Signed)
CSN: [id number]     Arrival date & time 03/10/14  1125 History   First MD Initiated Contact with Patient 03/10/14 1132     Chief Complaint  Patient presents with  . Urinary Tract Infection     (Consider location/radiation/quality/duration/timing/severity/associated sxs/prior Treatment) Patient is a 54 y.o. female presenting with urinary tract infection. The history is provided by the patient.  Urinary Tract Infection This is a new problem. The current episode started yesterday. The problem has been gradually worsening. Associated symptoms include fatigue and urinary symptoms. Pertinent negatives include no abdominal pain, arthralgias, chest pain, fever, headaches, nausea, neck pain or vomiting. Nothing aggravates the symptoms. She has tried nothing for the symptoms. The treatment provided no relief.    Past Medical History  Diagnosis Date  . 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  . Tobacco abuse     60-90-pack-year history; 1/3 pack per day in 06-21-2011  . Hypertension   . Diabetes mellitus, type 2   . Depression Jun 21, 2010    Following sudden death of husband  . Metabolic syndrome     Elevated triglycerides, low HDL, fasting hyperglycemia with low-dose sulfonylurea  . Myocardial infarction 09/2010  . PONV (postoperative nausea and vomiting)   . Chronic kidney disease     recent UTI  . Arthritis   . Coronary artery disease    Past Surgical History  Procedure Laterality Date  . Coronary angioplasty with stent placement  10/01/10    DES x2  . Cholecystectomy    . Bilateral oophorectomy  2009/06/20    for benign mass  . Metatarsal head excision Right 11/03/2012    Procedure: AMPUTATION OF RIGHT FIFTH TOE AND PORTION OF FIFTH METATARSAL;  Surgeon: Dallas Schimke, DPM;  Location: [facility];  Service: Orthopedics;  Laterality: Right;  . Incision and drainage of wound Right 11/24/2012    Procedure: DEBRIDEMENT WOUND FOOT;  Surgeon:  Dallas Schimke, DPM;  Location: [facility];  Service: Orthopedics;  Laterality: Right;  . Application of wound vac Right 11/24/2012    Procedure: APPLICATION OF WOUND VAC;  Surgeon: Dallas Schimke, DPM;  Location: [facility];  Service: Orthopedics;  Laterality: Right;  . Toe amputation    . Incision and drainage of wound Right 03/23/2013    Procedure: DEBRIDEMENT OF INFECTED BONE 5TH METATARSAL RIGHT FOOT;  Surgeon: Dallas Schimke, DPM;  Location: [facility];  Service: Orthopedics;  Laterality: Right;  . Breast surgery Left     brest biopsy  . Femoral-popliteal bypass graft Right 10/19/2013    Procedure: RIGHT FEMORAL-BELOW KNEE POPLITEAL ARTERY BYPASS GRAFT;  Surgeon: Chuck Hint, MD;  Location: [facility] OR;  Service: Vascular;  Laterality: Right;  . Amputation Right 10/19/2013    Procedure: AMPUTATION DIGIT-RIGHT 4TH TOE AMPUTATION;  Surgeon: Chuck Hint, MD;  Location: [facility] OR;  Service: Vascular;  Laterality: Right;  . Intraoperative arteriogram Right 10/19/2013    Procedure: INTRA OPERATIVE ARTERIOGRAM;  Surgeon: Chuck Hint, MD;  Location: [facility] OR;  Service: Vascular;  Laterality: Right;  . Abdominal aortagram N/A 10/09/2013    Procedure: ABDOMINAL Ronny Flurry;  Surgeon: Chuck Hint, MD;  Location: [facility] CATH LAB;  Service: Cardiovascular;  Laterality: N/A;   Family History  Problem Relation Age of Onset  . Heart disease Father   . Hyperlipidemia Father   . Hypertension Father   . Heart disease Mother   . Diabetes Mother   .  Hyperlipidemia Mother   . Hypertension Mother   . Varicose Veins Sister   . Cancer Brother    History  Substance Use Topics  . Smoking status: Current Some Day Smoker -- 0.25 packs/day for 30 years    Types: Cigarettes    Start date: 10/18/1983  . Smokeless tobacco: Never Used  . Alcohol Use: No   OB History    No data available     Review of Systems  Constitutional: Positive for fatigue. Negative for fever and activity  change.       All ROS Neg except as noted in HPI  HENT: Negative.   Eyes: Negative for photophobia and discharge.  Cardiovascular: Negative for chest pain and palpitations.  Gastrointestinal: Negative for nausea, vomiting, abdominal pain and blood in stool.  Genitourinary: Positive for dysuria. Negative for frequency and hematuria.  Musculoskeletal: Negative for back pain, arthralgias and neck pain.  Skin: Negative.   Neurological: Negative for headaches.  Psychiatric/Behavioral: Negative for hallucinations and confusion.      Allergies  Bactrim; Isosorbide mononitrate; and Keflex  Home Medications   Prior to Admission medications   Medication Sig Start Date End Date Taking? Authorizing Provider  aspirin EC 81 MG tablet Take 81 mg by mouth daily.   Yes Historical Provider, MD  atorvastatin (LIPITOR) 10 MG tablet Take 1 tablet (10 mg total) by mouth daily. 10/17/13  Yes Laqueta Linden, MD  fish oil-omega-3 fatty acids 1000 MG capsule Take 3 g by mouth daily.    Yes Historical Provider, MD  glipiZIDE (GLUCOTROL) 5 MG tablet Take 5 mg by mouth daily.   Yes Historical Provider, MD  metFORMIN (GLUCOPHAGE) 500 MG tablet Take 500 mg by mouth 2 (two) times daily with a meal.   Yes Historical Provider, MD  metoprolol tartrate (LOPRESSOR) 25 MG tablet Take 1 tablet (25 mg total) by mouth 2 (two) times daily. 07/20/12  Yes Kathlen Brunswick, MD  niacin 500 MG CR capsule Take 1 capsule (500 mg total) by mouth at bedtime. 12/25/11  Yes Dyann Kief, PA-C  nitroGLYCERIN (NITROSTAT) 0.4 MG SL tablet Place 1 tablet (0.4 mg total) under the tongue every 5 (five) minutes as needed. 07/20/12  Yes Kathlen Brunswick, MD  cephALEXin (KEFLEX) 500 MG capsule Take 1 capsule (500 mg total) by mouth 3 (three) times daily. Patient not taking: Reported on 03/10/2014 10/21/13   Lelon Mast J Rhyne, PA-C  ciprofloxacin (CIPRO) 500 MG tablet Take 1 tablet (500 mg total) by mouth 2 (two) times daily. One po bid x 7  days Patient not taking: Reported on 03/10/2014 01/20/14   Benny Lennert, MD  oxyCODONE (ROXICODONE) 5 MG immediate release tablet Take 1 tablet (5 mg total) by mouth every 6 (six) hours as needed for severe pain. Patient not taking: Reported on 01/24/2014 10/21/13   Samantha J Rhyne, PA-C   BP 147/79 mmHg  Pulse 76  Temp(Src) 98 F (36.7 C)  Resp 18  Ht  (1.651 m)  Wt 252 lb (114.306 kg)  BMI 41.93 kg/m2  SpO2 97% Physical Exam  Constitutional: She is oriented to person, place, and time. She appears well-developed and well-nourished.  Non-toxic appearance.  HENT:  Head: Normocephalic.  Right Ear: Tympanic membrane and external ear normal.  Left Ear: Tympanic membrane and external ear normal.  Eyes: EOM and lids are normal. Pupils are equal, round, and reactive to light.  Neck: Normal range of motion. Neck supple. Carotid bruit is not present.  Cardiovascular: Normal rate, regular rhythm, normal heart sounds, intact distal pulses and normal pulses.   Pulmonary/Chest: Breath sounds normal. No respiratory distress.  Abdominal: Soft. Bowel sounds are normal. There is no tenderness. There is no guarding.  No CVAT.  Musculoskeletal: Normal range of motion.  Lymphadenopathy:       Head (right side): No submandibular adenopathy present.       Head (left side): No submandibular adenopathy present.    She has no cervical adenopathy.  Neurological: She is alert and oriented to person, place, and time. She has normal strength. No cranial nerve deficit or sensory deficit.  Skin: Skin is warm and dry.  Psychiatric: She has a normal mood and affect. Her speech is normal.  Nursing note and vitals reviewed.   ED Course  Procedures (including critical care time) Labs Review Labs Reviewed  URINALYSIS, ROUTINE W REFLEX MICROSCOPIC    Imaging Review No results found.   EKG Interpretation None      MDM  Urinalysis is consistent with a urinary tract infection. A culture has been  sent to the lab. A prescription for Cipro his been given to the patient. Patient encouraged to use Azo for urine discomfort. Patient encouraged to increase fluids, and to return to the emergency department or see the primary physician if not improving. Patient acknowledges understanding of these discharge plans.    Final diagnoses:  UTI (lower urinary tract infection)    **I have reviewed nursing notes, vital signs, and all appropriate lab and imaging results for this patient.Kathie Dike*    Rozella Servello M Dymphna Wadley, PA-C 03/10/14 1546

## 2014-03-12 LAB — URINE CULTURE
Colony Count: 80000
Special Requests: NORMAL

## 2014-03-14 ENCOUNTER — Telehealth (HOSPITAL_BASED_OUTPATIENT_CLINIC_OR_DEPARTMENT_OTHER): Payer: Self-pay | Admitting: Emergency Medicine

## 2014-03-14 NOTE — Telephone Encounter (Signed)
Post ED Visit - Positive Culture Follow-up  Culture report reviewed by antimicrobial stewardship pharmacist: []  Wes Dulaney, Pharm.D., BCPS [x]  Celedonio MiyamotoJeremy Frens, [address] BCPS []  Georgina PillionElizabeth Martin, [address] BCPS []  [place]stone HeightsMinh Pham, [address] BCPS, AAHIVP []  Estella HuskMichelle Turner, Pharm.D., BCPS, AAHIVP []  Elder CyphersLorie Poole, [address] BCPS  Positive urine culture Enterococcus Treated with Ciprofloxacin, organism sensitive to the same and no further patient follow-up is required at this time.  Berle MullMiller, Vivek Grealish 03/14/2014, 10:02 AM

## 2014-08-01 ENCOUNTER — Other Ambulatory Visit (HOSPITAL_COMMUNITY): Payer: Managed Care, Other (non HMO)

## 2014-08-01 ENCOUNTER — Ambulatory Visit: Payer: Managed Care, Other (non HMO) | Admitting: Vascular Surgery

## 2014-08-01 ENCOUNTER — Encounter (HOSPITAL_COMMUNITY): Payer: Managed Care, Other (non HMO)

## 2014-08-13 ENCOUNTER — Other Ambulatory Visit: Payer: Self-pay

## 2014-10-30 ENCOUNTER — Other Ambulatory Visit (HOSPITAL_COMMUNITY): Payer: Self-pay | Admitting: Nephrology

## 2014-10-30 DIAGNOSIS — N183 Chronic kidney disease, stage 3 unspecified: Secondary | ICD-10-CM

## 2014-11-05 ENCOUNTER — Ambulatory Visit (HOSPITAL_COMMUNITY): Admission: RE | Admit: 2014-11-05 | Payer: Managed Care, Other (non HMO) | Source: Ambulatory Visit

## 2014-11-24 IMAGING — CR DG ANG/EXT/UNI/OR RIGHT
1 series · 1 of 1 positions shown · IV contrast (agent unspecified)
Comparison: 09/19/2013

CLINICAL DATA: Critical limb ischemia in the right lower extremity.
Femoral to below the knee bypass graft.

EXAM:
RIGHT JHON ELVER/EXT/UNI/ OR
TECHNIQUE: See operative report
CONTRAST:  See operative report
FLUOROSCOPY TIME:  See operative report

[AP]
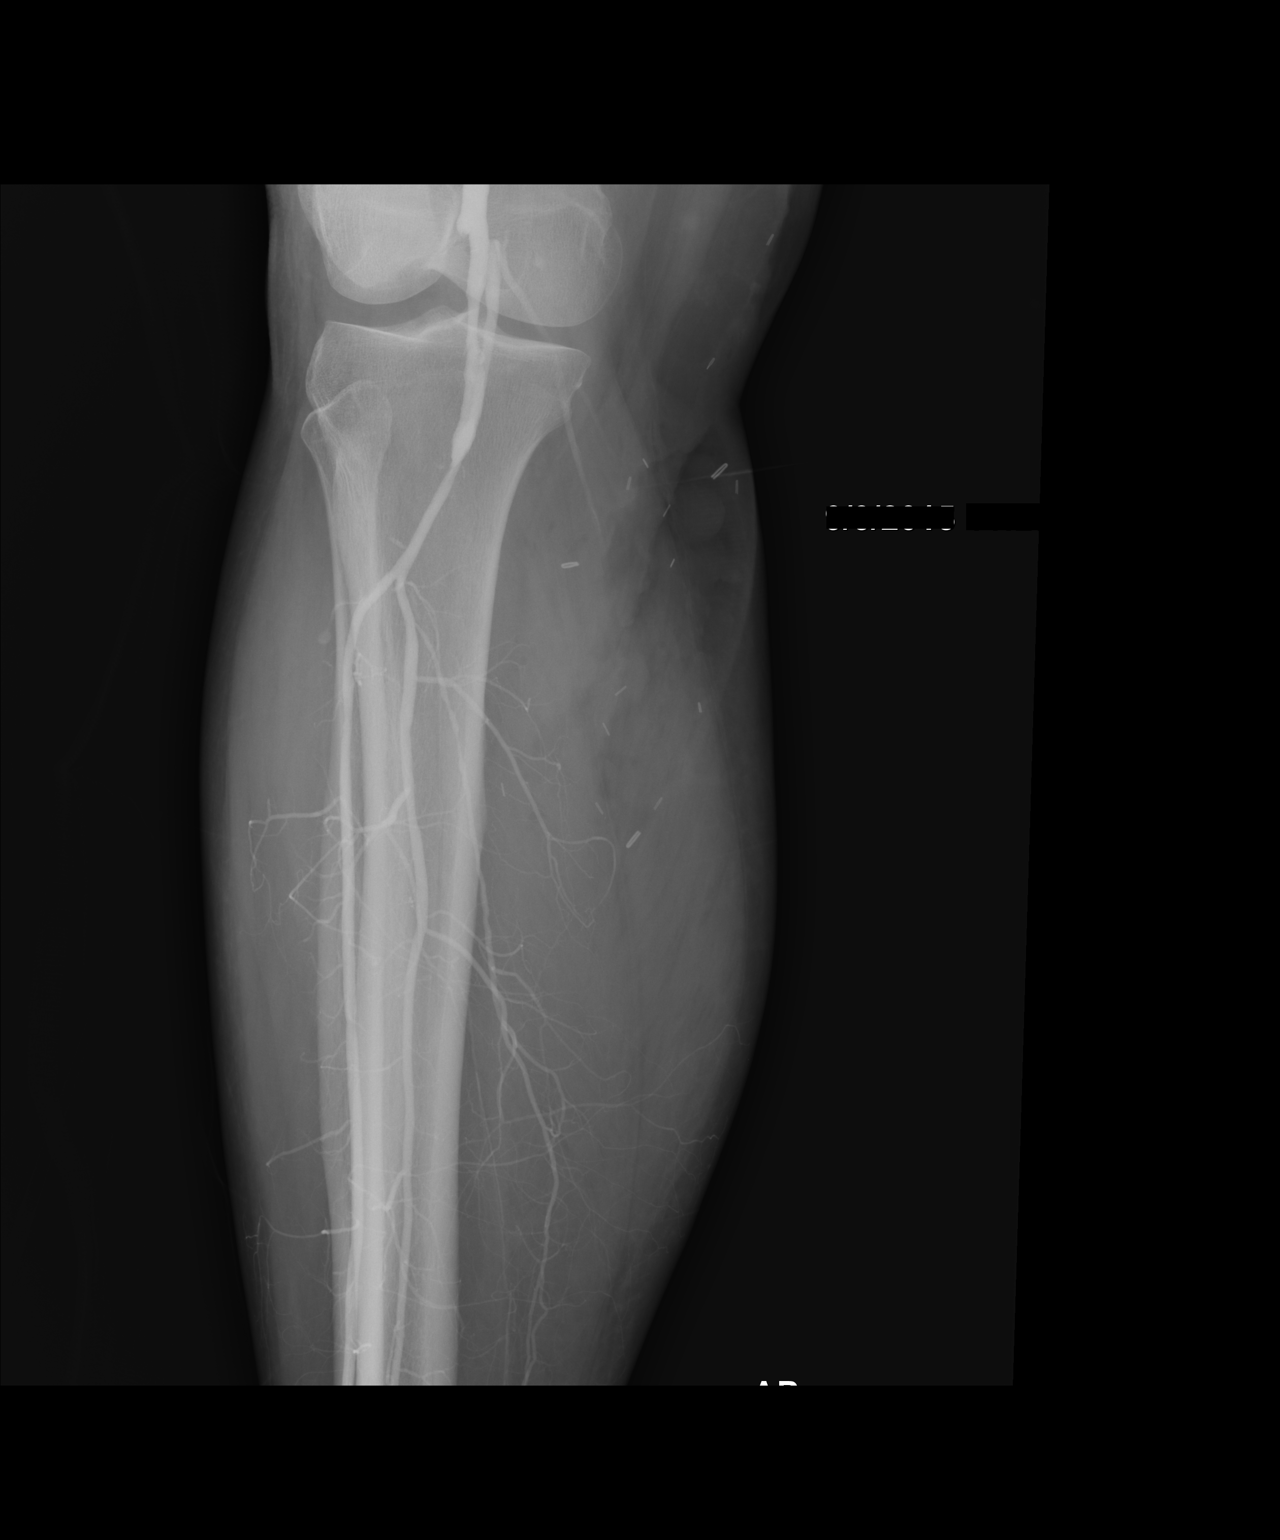

[1 of 1 positions shown; findings below may reference images not displayed]

FINDINGS: Single angiographic image of the right lower extremity was obtained.
There is a bypass graft that ties into the popliteal artery below
the knee. There is some contrast in the native mid popliteal artery.
Two vessel runoff in the right calf. Runoff vessels appear to
represent the anterior tibial artery and peroneal artery.
IMPRESSION: Patent bypass graft.

## 2016-11-10 ENCOUNTER — Encounter: Payer: Self-pay | Admitting: General Surgery

## 2016-11-10 ENCOUNTER — Ambulatory Visit (INDEPENDENT_AMBULATORY_CARE_PROVIDER_SITE_OTHER): Payer: Managed Care, Other (non HMO) | Admitting: General Surgery

## 2016-11-10 VITALS — BP 176/84 | HR 63 | Temp 96.8°F | Resp 18 | Ht 66.0 in | Wt 236.0 lb

## 2016-11-10 DIAGNOSIS — K432 Incisional hernia without obstruction or gangrene: Secondary | ICD-10-CM

## 2016-11-10 NOTE — Patient Instructions (Signed)
 Ventral Hernia A ventral hernia is a bulge of tissue from inside the abdomen that pushes through a weak area of the muscles that form the front wall of the abdomen. The tissues inside the abdomen are inside a sac (peritoneum). These tissues include the small intestine, large intestine, and the fatty tissue that covers the intestines (omentum). Sometimes, the bulge that forms a hernia contains intestines. Other hernias contain only fat. Ventral hernias do not go away without surgical treatment. There are several types of ventral hernias. You may have:  A hernia at an incision site from previous abdominal surgery (incisional hernia).  A hernia just above the belly button (epigastric hernia), or at the belly button (umbilical hernia). These types of hernias can develop from heavy lifting or straining.  A hernia that comes and goes (reducible hernia). It may be visible only when you lift or strain. This type of hernia can be pushed back into the abdomen (reduced).  A hernia that traps abdominal tissue inside the hernia (incarcerated hernia). This type of hernia does not reduce.  A hernia that cuts off blood flow to the tissues inside the hernia (strangulated hernia). The tissues can start to die if this happens. This is a very painful bulge that cannot be reduced. A strangulated hernia is a medical emergency.  What are the causes? This condition is caused by abdominal tissue putting pressure on an area of weakness in the abdominal muscles. What increases the risk? The following factors may make you more likely to develop this condition:  Being female.  Being 60 or older.  Being overweight or obese.  Having had previous abdominal surgery, especially if there was an infection after surgery.  Having had an injury to the abdominal wall.  Having had several pregnancies.  Having a buildup of fluid inside the abdomen (ascites).  What are the signs or symptoms? The only symptom of a ventral  hernia may be a painless bulge in the abdomen. A reducible hernia may be visible only when you strain, cough, or lift. Other symptoms may include:  Dull pain.  A feeling of pressure.  Signs and symptoms of a strangulated hernia may include:  Increasing pain.  Nausea and vomiting.  Pain when pressing on the hernia.  The skin over the hernia turning red or purple.  Constipation.  Blood in the stool (feces).  How is this diagnosed? This condition may be diagnosed based on:  Your symptoms.  Your medical history.  A physical exam. You may be asked to cough or strain while standing. These actions increase the pressure inside your abdomen and force the hernia through the opening in your muscles. Your health care provider may try to reduce the hernia by pressing on it.  Imaging studies, such as an ultrasound or CT scan.  How is this treated? This condition is treated with surgery. If you have a strangulated hernia, surgery is done as soon as possible. If your hernia is small and not incarcerated, you may be asked to lose some weight before surgery. Follow these instructions at home:  Follow instructions from your health care provider about eating or drinking restrictions.  If you are overweight, your health care provider may recommend that you increase your activity level and eat a healthier diet.  Do not lift anything that is heavier than 10 lb (4.5 kg).  Return to your normal activities as told by your health care provider. Ask your health care provider what activities are safe for you. You   may need to avoid activities that increase pressure on your hernia.  Take over-the-counter and prescription medicines only as told by your health care provider.  Keep all follow-up visits as told by your health care provider. This is important. Contact a health care provider if:  Your hernia gets larger.  Your hernia becomes painful. Get help right away if:  Your hernia becomes  increasingly painful.  You have pain along with any of the following: ? Changes in skin color in the area of the hernia. ? Nausea. ? Vomiting. ? Fever. Summary  A ventral hernia is a bulge of tissue from inside the abdomen that pushes through a weak area of the muscles that form the front wall of the abdomen.  This condition is treated with surgery, which may be urgent depending on your hernia.  Do not lift anything that is heavier than 10 lb (4.5 kg), and follow activity instructions from your health care provider. This information is not intended to replace advice given to you by your health care provider. Make sure you discuss any questions you have with your health care provider. Document Released: 01/20/2012 Document Revised: 09/20/2015 Document Reviewed: 09/20/2015 Elsevier Interactive Patient Education  2018 [employer]  

## 2016-11-10 NOTE — Progress Notes (Signed)
Christy [name]; [id number]; April 01, 1960   HPI Patient is a 56 year old white female who was referred my care by Dr. Dimas Aguas for evaluation and treatment of a ventral hernia. She had an episode where the hernia was stuck out and she went to the emergency room due to abdominal pain. She was noted at that time to have a ventral hernia just inferior into the left of the umbilicus per the CAT scan report. She states that she has had a hernia present for 4 years. It only occasionally causes her discomfort. This was the first time that she had acute onset of pain, but it has resolved. She currently has no pain. She denies any fever, chills, nausea, vomiting. Her bowel movements are somewhat regular. Past Medical History:  Diagnosis Date  . 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  . Arthritis   . Chronic kidney disease    recent UTI  . Coronary artery disease   . Depression Jun 26, 2010   Following sudden death of husband  . Diabetes mellitus, type 2 (HCC)   . Hypertension   . Metabolic syndrome    Elevated triglycerides, low HDL, fasting hyperglycemia with low-dose sulfonylurea  . Myocardial infarction (HCC) 09/2010  . PONV (postoperative nausea and vomiting)   . Tobacco abuse    60-90-pack-year history; 1/3 pack per day in 06/26/2011    Past Surgical History:  Procedure Laterality Date  . ABDOMINAL AORTAGRAM N/A 10/09/2013   Procedure: ABDOMINAL Ronny Flurry;  Surgeon: Chuck Hint, MD;  Location: [facility] CATH LAB;  Service: Cardiovascular;  Laterality: N/A;  . AMPUTATION Right 10/19/2013   Procedure: AMPUTATION DIGIT-RIGHT 4TH TOE AMPUTATION;  Surgeon: Chuck Hint, MD;  Location: [facility] OR;  Service: Vascular;  Laterality: Right;  . APPLICATION OF WOUND VAC Right 11/24/2012   Procedure: APPLICATION OF WOUND VAC;  Surgeon: Dallas Schimke, DPM;  Location: [facility];  Service: Orthopedics;  Laterality: Right;  . BILATERAL OOPHORECTOMY  06/25/09   for  benign mass  . BREAST SURGERY Left    brest biopsy  . CHOLECYSTECTOMY    . CORONARY ANGIOPLASTY WITH STENT PLACEMENT  10/01/10   DES x2  . FEMORAL-POPLITEAL BYPASS GRAFT Right 10/19/2013   Procedure: RIGHT FEMORAL-BELOW KNEE POPLITEAL ARTERY BYPASS GRAFT;  Surgeon: Chuck Hint, MD;  Location: [facility] OR;  Service: Vascular;  Laterality: Right;  . INCISION AND DRAINAGE OF WOUND Right 11/24/2012   Procedure: DEBRIDEMENT WOUND FOOT;  Surgeon: Dallas Schimke, DPM;  Location: [facility];  Service: Orthopedics;  Laterality: Right;  . INCISION AND DRAINAGE OF WOUND Right 03/23/2013   Procedure: DEBRIDEMENT OF INFECTED BONE 5TH METATARSAL RIGHT FOOT;  Surgeon: Dallas Schimke, DPM;  Location: [facility];  Service: Orthopedics;  Laterality: Right;  . INTRAOPERATIVE ARTERIOGRAM Right 10/19/2013   Procedure: INTRA OPERATIVE ARTERIOGRAM;  Surgeon: Chuck Hint, MD;  Location: [facility] OR;  Service: Vascular;  Laterality: Right;  . METATARSAL HEAD EXCISION Right 11/03/2012   Procedure: AMPUTATION OF RIGHT FIFTH TOE AND PORTION OF FIFTH METATARSAL;  Surgeon: Dallas Schimke, DPM;  Location: [facility];  Service: Orthopedics;  Laterality: Right;  . TOE AMPUTATION      Family History  Problem Relation Age of Onset  . Heart disease Father   . Hyperlipidemia Father   . Hypertension Father   . Heart disease Mother   . Diabetes Mother   . Hyperlipidemia Mother   . Hypertension Mother   . Varicose Veins  Sister   . Cancer Brother     Current Outpatient Prescriptions on File Prior to Visit  Medication Sig Dispense Refill  . aspirin EC 81 MG tablet Take 81 mg by mouth daily.    Marland Kitchen atorvastatin (LIPITOR) 10 MG tablet Take 1 tablet (10 mg total) by mouth daily. 90 tablet 3  . cephALEXin (KEFLEX) 500 MG capsule Take 1 capsule (500 mg total) by mouth 3 (three) times daily. (Patient not taking: Reported on 03/10/2014) 21 capsule 0  . ciprofloxacin (CIPRO) 500 MG tablet Take 1 tablet (500 mg total) by  mouth 2 (two) times daily. 14 tablet 0  . fish oil-omega-3 fatty acids 1000 MG capsule Take 3 g by mouth daily.     Marland Kitchen glipiZIDE (GLUCOTROL) 5 MG tablet Take 5 mg by mouth daily.    . metFORMIN (GLUCOPHAGE) 500 MG tablet Take 500 mg by mouth 2 (two) times daily with a meal.    . metoprolol tartrate (LOPRESSOR) 25 MG tablet Take 1 tablet (25 mg total) by mouth 2 (two) times daily. 180 tablet 3  . niacin 500 MG CR capsule Take 1 capsule (500 mg total) by mouth at bedtime. 30 capsule 6  . nitroGLYCERIN (NITROSTAT) 0.4 MG SL tablet Place 1 tablet (0.4 mg total) under the tongue every 5 (five) minutes as needed. 25 tablet 5  . oxyCODONE (ROXICODONE) 5 MG immediate release tablet Take 1 tablet (5 mg total) by mouth every 6 (six) hours as needed for severe pain. (Patient not taking: Reported on 01/24/2014) 30 tablet 0   No current facility-administered medications on file prior to visit.     Allergies  Allergen Reactions  . Bactrim Diarrhea  . Isosorbide Mononitrate Other (See Comments)    Made patient feel as though she was about to have another MI  . Keflex [Cephalexin]     Yeast infection    History  Alcohol Use No    History  Smoking Status  . Current Some Day Smoker  . Packs/day: 0.25  . Years: 30.00  . Types: Cigarettes  . Start date: 10/18/1983  Smokeless Tobacco  . Never Used    Review of Systems  Constitutional: Negative.   HENT: Positive for sinus pain.   Eyes: Negative.   Respiratory: Positive for wheezing.   Cardiovascular: Negative.   Gastrointestinal: Positive for abdominal pain and nausea. Negative for heartburn.  Genitourinary: Positive for frequency.  Musculoskeletal: Negative.   Skin: Negative.   Neurological: Positive for headaches.  Endo/Heme/Allergies: Negative.   Psychiatric/Behavioral: Negative.     Objective   Vitals:   11/10/16 1515  BP: (!) 176/84  Pulse: 63  Resp: 18  Temp: (!) 96.8 F (36 C)    Physical Exam  Constitutional: She is  oriented to person, place, and time and well-developed, well-nourished, and in no distress.  HENT:  Head: Normocephalic and atraumatic.  Cardiovascular: Normal rate, regular rhythm and normal heart sounds.  Exam reveals no gallop and no friction rub.   No murmur heard. Pulmonary/Chest: Effort normal and breath sounds normal. No respiratory distress. She has no wheezes. She has no rales.  Abdominal: Soft. Bowel sounds are normal. She exhibits no distension. There is no tenderness. There is no rebound.  Reducible incisional hernia noted just to the left of an lower midline incision, inferior to the umbilicus. Diastases recti also present.  Neurological: She is alert and oriented to person, place, and time.  Skin: Skin is warm and dry.  Vitals reviewed.  CT scan  report reviewed. Assessment  Incisional hernia, currently asymptomatic Plan   I did discuss with her that this could recur. Would recommend laparoscopic incisional herniorrhaphy with mesh at some point in the future. She would like to think about it at this time, which is fine with me. The risks and symptoms of incarceration were explained to the patient. Should they increase in frequency, I did urge the patient to contact me so that we can proceed with surgery. She understands and agrees.

## 2017-08-30 ENCOUNTER — Other Ambulatory Visit (HOSPITAL_COMMUNITY): Payer: Self-pay | Admitting: Family Medicine

## 2017-08-30 DIAGNOSIS — Z1231 Encounter for screening mammogram for malignant neoplasm of breast: Secondary | ICD-10-CM

## 2017-10-26 ENCOUNTER — Other Ambulatory Visit (HOSPITAL_COMMUNITY): Payer: Self-pay | Admitting: Family Medicine

## 2017-10-26 DIAGNOSIS — N63 Unspecified lump in unspecified breast: Secondary | ICD-10-CM

## 2017-11-03 ENCOUNTER — Ambulatory Visit (HOSPITAL_COMMUNITY): Payer: Managed Care, Other (non HMO)

## 2017-11-03 ENCOUNTER — Encounter (HOSPITAL_COMMUNITY): Payer: Self-pay

## 2018-01-31 ENCOUNTER — Ambulatory Visit (INDEPENDENT_AMBULATORY_CARE_PROVIDER_SITE_OTHER): Payer: 59 | Admitting: Mental Health

## 2018-01-31 ENCOUNTER — Encounter: Payer: Self-pay | Admitting: Mental Health

## 2018-01-31 DIAGNOSIS — F331 Major depressive disorder, recurrent, moderate: Secondary | ICD-10-CM

## 2018-01-31 DIAGNOSIS — F411 Generalized anxiety disorder: Secondary | ICD-10-CM | POA: Diagnosis not present

## 2018-01-31 NOTE — Progress Notes (Signed)
[facility] Counsel[facility] Initial Adult Exam- Part I  Name: Christy [name] Date: 01/31/2018 MRN: [id number] DOB: Sep 12, 1960 PCP: Selinda Flavin, MD  Time spent: 45 minutes   Guardian/Payee patient  Paperwork requested:  No   Reason for Visit /Presenting Problem: anxiety. Depression, adhedonia, agoraphobia  Mental Status Exam:   Appearance:   Guarded     Behavior:  Resistant  Motor:  Normal  Speech/Language:   Clear and Coherent  Affect:  Negative and Restricted  Mood:  anxious, depressed, irritable and labile  Thought process:  concrete  Thought content:    WNL  Sensory/Perceptual disturbances:    WNL  Orientation:  oriented to person, place and time/date  Attention:  Good  Concentration:  Good  Memory:  WNL  Fund of knowledge:   Good  Insight:    Good  Judgment:   Good  Impulse Control:  Poor   Reported Symptoms:  Panic attacks, Isolation and withdrawal, Excessive spending and Lack of motivation  Risk Assessment: Danger to Self:  No Self-injurious Behavior: No Danger to Others: No Duty to Warn:no Physical Aggression / Violence:No  Access to Firearms a concern: No  Gang Involvement:No  Patient / guardian was educated about steps to take if suicide or homicide risk level increases between visits: no While future psychiatric events cannot be accurately predicted, the patient does not currently require acute inpatient psychiatric care and does not currently meet [facility] involuntary commitment criteria.  Substance Abuse History: Current substance abuse: smoker    Past Psychiatric History:   Previous psychological history is significant for anxiety, depression and r/o bipolar Outpatient Providers:Lisa Alwart History of Psych Hospitalization: No  Psychological Testing: n/a    Medical History/Surgical History:reviewed Past Medical History:  Diagnosis Date  . 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  .  Arthritis   . Chronic kidney disease    recent UTI  . Coronary artery disease   . Depression 07/10/2010   Following sudden death of husband  . Diabetes mellitus, type 2 (HCC)   . Hypertension   . Metabolic syndrome    Elevated triglycerides, low HDL, fasting hyperglycemia with low-dose sulfonylurea  . Myocardial infarction (HCC) 09/2010  . PONV (postoperative nausea and vomiting)   . Tobacco abuse    60-90-pack-year history; 1/3 pack per day in 2011-07-10    Past Surgical History:  Procedure Laterality Date  . ABDOMINAL AORTAGRAM N/A 10/09/2013   Procedure: ABDOMINAL Ronny Flurry;  Surgeon: Chuck Hint, MD;  Location: [facility] CATH LAB;  Service: Cardiovascular;  Laterality: N/A;  . AMPUTATION Right 10/19/2013   Procedure: AMPUTATION DIGIT-RIGHT 4TH TOE AMPUTATION;  Surgeon: Chuck Hint, MD;  Location: [facility] OR;  Service: Vascular;  Laterality: Right;  . APPLICATION OF WOUND VAC Right 11/24/2012   Procedure: APPLICATION OF WOUND VAC;  Surgeon: Dallas Schimke, DPM;  Location: [facility] ORS;  Service: Orthopedics;  Laterality: Right;  . BILATERAL OOPHORECTOMY  07-09-2009   for benign mass  . BREAST SURGERY Left    brest biopsy  . CHOLECYSTECTOMY    . CORONARY ANGIOPLASTY WITH STENT PLACEMENT  10/01/10   DES x2  . FEMORAL-POPLITEAL BYPASS GRAFT Right 10/19/2013   Procedure: RIGHT FEMORAL-BELOW KNEE POPLITEAL ARTERY BYPASS GRAFT;  Surgeon: Chuck Hint, MD;  Location: [facility] OR;  Service: Vascular;  Laterality: Right;  . INCISION AND DRAINAGE OF WOUND Right 11/24/2012   Procedure: DEBRIDEMENT WOUND FOOT;  Surgeon: Dallas Schimke, DPM;  Location:  [facility] ORS;  Service: Orthopedics;  Laterality: Right;  . INCISION AND DRAINAGE OF WOUND Right 03/23/2013   Procedure: DEBRIDEMENT OF INFECTED BONE 5TH METATARSAL RIGHT FOOT;  Surgeon: Dallas Schimke, DPM;  Location: [facility] ORS;  Service: Orthopedics;  Laterality: Right;  . INTRAOPERATIVE ARTERIOGRAM Right 10/19/2013   Procedure: INTRA OPERATIVE  ARTERIOGRAM;  Surgeon: Chuck Hint, MD;  Location: [facility] OR;  Service: Vascular;  Laterality: Right;  . METATARSAL HEAD EXCISION Right 11/03/2012   Procedure: AMPUTATION OF RIGHT FIFTH TOE AND PORTION OF FIFTH METATARSAL;  Surgeon: Dallas Schimke, DPM;  Location: [facility] ORS;  Service: Orthopedics;  Laterality: Right;  . TOE AMPUTATION      Medications: Current Outpatient Medications  Medication Sig Dispense Refill  . aspirin EC 81 MG tablet Take 81 mg by mouth daily.    Marland Kitchen atorvastatin (LIPITOR) 10 MG tablet Take 1 tablet (10 mg total) by mouth daily. 90 tablet 3  . cephALEXin (KEFLEX) 500 MG capsule Take 1 capsule (500 mg total) by mouth 3 (three) times daily. (Patient not taking: Reported on 03/10/2014) 21 capsule 0  . ciprofloxacin (CIPRO) 500 MG tablet Take 1 tablet (500 mg total) by mouth 2 (two) times daily. 14 tablet 0  . fish oil-omega-3 fatty acids 1000 MG capsule Take 3 g by mouth daily.     Marland Kitchen glipiZIDE (GLUCOTROL) 5 MG tablet Take 5 mg by mouth daily.    . metFORMIN (GLUCOPHAGE) 500 MG tablet Take 500 mg by mouth 2 (two) times daily with a meal.    . metoprolol tartrate (LOPRESSOR) 25 MG tablet Take 1 tablet (25 mg total) by mouth 2 (two) times daily. 180 tablet 3  . niacin 500 MG CR capsule Take 1 capsule (500 mg total) by mouth at bedtime. 30 capsule 6  . nitroGLYCERIN (NITROSTAT) 0.4 MG SL tablet Place 1 tablet (0.4 mg total) under the tongue every 5 (five) minutes as needed. 25 tablet 5  . oxyCODONE (ROXICODONE) 5 MG immediate release tablet Take 1 tablet (5 mg total) by mouth every 6 (six) hours as needed for severe pain. (Patient not taking: Reported on 01/24/2014) 30 tablet 0   No current facility-administered medications for this visit.     Allergies  Allergen Reactions  . Bactrim Diarrhea  . Isosorbide Mononitrate Other (See Comments)    Made patient feel as though she was about to have another MI  . Keflex [Cephalexin]     Yeast infection    Diagnoses:     ICD-10-CM   1. Major depressive disorder, recurrent episode, moderate (HCC) F33.1   2. Generalized anxiety disorder F41.1      Part II to be continued at next session:   No.   Ulice Bold, LPC    Abuse History: Victim: none Report needed: no Perpetrator of abuse: no Witness / Exposure to Domestic Violence:  none Protective Services Involvement: no Witness to [employer] Violence:  no   Family / Social History:    Living situation:  Alone Sexual Orientation: straight Relationship Status:   Single   Name of spouse / other:  If a parent, number of children / ages:   None  Victorino Dike 39  Support Systems: Family  Financial Stress:   None one income  Income/Employment/Disability:   Employed Advice worker Service: none  Educational History:   High School  Religion/Sprituality/World View:    Engineer, production, Christian  Any cultural differences that may affect / interfere with treatment:  No  Recreation/Hobbies: bird  watching, TV, pet dog, camping  Stressors:  Emotional dysregulation  Strengths:  Family, job  Barriers: none  Pending legal issue / charges: none  History of legal issue / charges: none   Diagnosis:  Generalized anxiety                     Major Depression                     R/O Bippolar

## 2018-02-07 ENCOUNTER — Ambulatory Visit: Payer: Managed Care, Other (non HMO) | Admitting: Mental Health

## 2018-02-22 ENCOUNTER — Encounter: Payer: Self-pay | Admitting: Mental Health

## 2018-02-22 ENCOUNTER — Ambulatory Visit (INDEPENDENT_AMBULATORY_CARE_PROVIDER_SITE_OTHER): Payer: 59 | Admitting: Mental Health

## 2018-02-22 DIAGNOSIS — F331 Major depressive disorder, recurrent, moderate: Secondary | ICD-10-CM | POA: Diagnosis not present

## 2018-02-22 DIAGNOSIS — F411 Generalized anxiety disorder: Secondary | ICD-10-CM

## 2018-02-22 NOTE — Progress Notes (Signed)
      [facility] Counselor/Therapist Progress Note  Patient ID: Christy [name], MRN: [id number],    Date: 02/22/2018  Time Spent: 45 minutes   Treatment Type: Individual Therapy  Reported Symptoms: Depressed mood, Anxious Mood, Irritability and Isolation and withdrawal  Mental Status Exam:  Appearance:   Casual     Behavior:  Agitated and obese  Motor:  Normal  Speech/Language:   Clear and Coherent  Affect:  Depressed  Mood:  anxious and depressed  Thought process:  normal  Thought content:    WNL  Sensory/Perceptual disturbances:    WNL  Orientation:  oriented to person, place and time/date  Attention:  Good  Concentration:  Good  Memory:  fair  Fund of knowledge:   Good  Insight:    Good  Judgment:   Good  Impulse Control:  Good   Risk Assessment: Danger to Self:  No Self-injurious Behavior: No Danger to Others: No Duty to Warn:no Physical Aggression / Violence:No  Access to Firearms a concern: No  Gang Involvement:No   Subjective: Unable to return to work this week due to anxiety and depression. Job atmosphere has changed due to change in supervisor who micromanages and is not collaborative or helpful/  Interventions: Engineer, manufacturing systems Therapy, Solution-Oriented/Positive Psychology, Insight-Oriented, Interpersonal and supportive  Diagnosis:   ICD-10-CM   1. Major depressive disorder, recurrent episode, moderate (HCC) F33.1   2. Generalized anxiety disorder F41.1     Plan:    Self care program              Improve coping skills at work              Increase mood stability              Medication evaluation with TH  Ulice Bold, LPC

## 2018-03-11 ENCOUNTER — Encounter: Payer: Self-pay | Admitting: Physician Assistant

## 2018-03-11 ENCOUNTER — Ambulatory Visit (INDEPENDENT_AMBULATORY_CARE_PROVIDER_SITE_OTHER): Payer: 59 | Admitting: Physician Assistant

## 2018-03-11 VITALS — BP 143/78 | HR 78 | Ht 65.0 in | Wt 235.0 lb

## 2018-03-11 DIAGNOSIS — F17200 Nicotine dependence, unspecified, uncomplicated: Secondary | ICD-10-CM | POA: Diagnosis not present

## 2018-03-11 DIAGNOSIS — Z79899 Other long term (current) drug therapy: Secondary | ICD-10-CM | POA: Diagnosis not present

## 2018-03-11 DIAGNOSIS — F331 Major depressive disorder, recurrent, moderate: Secondary | ICD-10-CM | POA: Diagnosis not present

## 2018-03-11 MED ORDER — LITHIUM CARBONATE 300 MG PO TABS: 600.0000 mg | ORAL_TABLET | Freq: Every day | ORAL | 1 refills | Status: DC

## 2018-03-11 NOTE — Progress Notes (Signed)
[facility] MD/PA/NP Initial Note  03/12/2018 9:34 AM pt was seen 03/11/18 Christy [name]  MRN:  [id number]  Chief Complaint:  Chief Complaint    Depression      HPI: Has no desire to do anything.  Hasn't gone back to work after the holidays.  Works at [employer] in [place]. Doesn't want to go anywhere.  Will get ready to go work but won't go, this has happened frequently in the past 6 months.  She has been given FMLA intermittent time from her PCP.  Puts off things like going shopping or paying bills, and not b/c of money, but just because she does not have the motivation to do those things.  Hasn't taken a bath for 10 days until getting ready to come here this morning.  "I do brush my teeth though."  No energy/motivation. Doesn't want to cry a lot.  "I can't stand people.  I just want to be away from them. It gets on my nerves to go to [employer] even." Sleeps about 8 hours/d.  Sx going on for about 6 months.  Initially she was sleeping all the time.  Was put on Effexor a month ago and then increased last week by Efraim KaufmannMelissa  Alwardt at [facility] [employer].  Also started Lithium about 3 months ago.  States she is not feeling any difference after starting those medications.  She denies suicidal or homicidal thoughts.  H/O anxiety but not bad now except at work.  "As long as I am at home not doing anything, I am fine."  She denies palpitations, shortness of breath, chest tightness, sweaty palms, or dizziness.  Patient denies increased energy with decreased need for sleep, no increased talkativeness, no racing thoughts, no impulsivity or risky behaviors, no increased spending, no increased libido, no grandiosity.  Visit Diagnosis:    ICD-10-CM   1. Major depressive disorder, recurrent episode, moderate (HCC) F33.1   2. Encounter for long-term (current) use of medications Z79.899 Lithium level  3. Tobacco use disorder F17.200     Past Psychiatric History: no h/o suicide attempts or SI/HI.   No psych hospitalizations.    She is currently seeing [facility] Boldarson Sarvis, [facility], which has been helpful.  Past medications for mental health diagnoses include: Lexapro, Lithium, Effexor, Wellbutrin caused n/v,   Past Medical History:  Past Medical History:  Diagnosis Date  . 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  . Arthritis   . Chronic kidney disease    recent UTI  . Coronary artery disease   . Depression 2012   Following sudden death of husband  . Diabetes mellitus, type 2 (HCC)   . Hypertension   . Kidney cysts   . Metabolic syndrome    Elevated triglycerides, low HDL, fasting hyperglycemia with low-dose sulfonylurea  . Myocardial infarction (HCC) 09/2010  . PONV (postoperative nausea and vomiting)   . Tobacco abuse    60-90-pack-year history; 1/3 pack per day in 2013    Past Surgical History:  Procedure Laterality Date  . ABDOMINAL AORTAGRAM N/A 10/09/2013   Procedure: ABDOMINAL Ronny FlurryAORTAGRAM;  Surgeon: Chuck Hinthristopher S Dickson, MD;  [facility];  Service: Cardiovascular;  Laterality: N/A;  . AMPUTATION Right 10/19/2013   Procedure: AMPUTATION DIGIT-RIGHT 4TH TOE AMPUTATION;  Surgeon: Chuck Hinthristopher S Dickson, MD;  [facility];  Service: Vascular;  Laterality: Right;  . APPLICATION OF WOUND VAC Right 11/24/2012   Procedure: APPLICATION OF WOUND VAC;  Surgeon: Ephriam JenkinsBenjamin Ivan  Nolen Mu, DPM;  Location: [facility] ORS;  Service: Orthopedics;  Laterality: Right;  . BILATERAL OOPHORECTOMY  2011   for benign mass  . BREAST SURGERY Left    brest biopsy  . CHOLECYSTECTOMY    . CORONARY ANGIOPLASTY WITH STENT PLACEMENT  10/01/10   DES x2  . FEMORAL-POPLITEAL BYPASS GRAFT Right 10/19/2013   Procedure: RIGHT FEMORAL-BELOW KNEE POPLITEAL ARTERY BYPASS GRAFT;  Surgeon: Chuck Hint, MD;  Location: [facility] OR;  Service: Vascular;  Laterality: Right;  . INCISION AND DRAINAGE OF WOUND Right 11/24/2012   Procedure: DEBRIDEMENT WOUND FOOT;  Surgeon: Dallas Schimke,  DPM;  Location: [facility] ORS;  Service: Orthopedics;  Laterality: Right;  . INCISION AND DRAINAGE OF WOUND Right 03/23/2013   Procedure: DEBRIDEMENT OF INFECTED BONE 5TH METATARSAL RIGHT FOOT;  Surgeon: Dallas Schimke, DPM;  Location: [facility] ORS;  Service: Orthopedics;  Laterality: Right;  . INTRAOPERATIVE ARTERIOGRAM Right 10/19/2013   Procedure: INTRA OPERATIVE ARTERIOGRAM;  Surgeon: Chuck Hint, MD;  Location: [facility] OR;  Service: Vascular;  Laterality: Right;  . METATARSAL HEAD EXCISION Right 11/03/2012   Procedure: AMPUTATION OF RIGHT FIFTH TOE AND PORTION OF FIFTH METATARSAL;  Surgeon: Dallas Schimke, DPM;  Location: [facility] ORS;  Service: Orthopedics;  Laterality: Right;  . TOE AMPUTATION      Family Psychiatric History: None  Family History:  Family History  Problem Relation Age of Onset  . Heart disease Father   . Hyperlipidemia Father   . Hypertension Father   . Heart disease Mother   . Diabetes Mother   . Hyperlipidemia Mother   . Hypertension Mother   . Glaucoma Mother   . Varicose Veins Sister   . Cancer Brother   . Cancer Maternal Grandfather   . Diabetes Maternal Grandmother   . Heart disease Maternal Grandmother   . Hypertension Maternal Grandmother   . COPD Paternal Grandfather   . Hyperlipidemia Sister     Social History:  Social History   Socioeconomic History  . Marital status: Widowed    Spouse name: Not on file  . Number of children: 1  . Years of education: Not on file  . Highest education level: 12th grade  Occupational History  . Occupation: Horticulturist, commercial: [employer]    Comment: [employer]  Social Needs  . Financial resource strain: Not very hard  . Food insecurity:    Worry: Never true    Inability: Never true  . Transportation needs:    Medical: No    Non-medical: No  Tobacco Use  . Smoking status: Current Some Day Smoker    Packs/day: 1.00    Years: 30.00    Pack years: 30.00    Types: Cigarettes    Start  date: 10/18/1983  . Smokeless tobacco: Never Used  Substance and Sexual Activity  . Alcohol use: No  . Drug use: No  . Sexual activity: Never    Partners: Male    Birth control/protection: Surgical  Lifestyle  . Physical activity:    Days per week: 0 days    Minutes per session: 0 min  . Stress: Rather much  Relationships  . Social connections:    Talks on phone: Never    Gets together: Once a week    Attends religious service: Never    Active member of club or organization: No    Attends meetings of clubs or organizations: Never    Relationship status: Widowed  Other Topics Concern  .  Not on file  Social History Narrative   Widowed x 7 years Was married 14 years.  It was 3rd marriage.    Has a 58 yr old dtr w/ 1st husband.    Was born and raised in [place] lives in [place], [place]   Grew up w/ both parents in home.  2 sisters, 1 brother.  Never abused.    No legal trouble.   [employer] not in church now.   Caffeine diet coke all day.    Allergies:  Allergies  Allergen Reactions  . Bactrim Diarrhea  . Isosorbide Mononitrate Other (See Comments)    Made patient feel as though she was about to have another MI  . Keflex [Cephalexin]     Yeast infection    Metabolic Disorder Labs: Lab Results  Component Value Date   HGBA1C 7.4 (H) 10/19/2013   MPG 166 (H) 10/19/2013   MPG 154 (H) 10/01/2010   No results found for: PROLACTIN Lab Results  Component Value Date   CHOL 149 02/06/2014   TRIG 267 (H) 02/06/2014   HDL 35 (L) 02/06/2014   CHOLHDL 4.3 02/06/2014   VLDL 53 (H) 02/06/2014   LDLCALC 61 02/06/2014   LDLCALC 58 08/16/2012   02/02/18 Glucose 151 BUN 16/ CR 1.29  Hgb A1C 6.4  Lithium level 1.1 TSH 4.53 all other CMP is nl.  These values are from labs at her PCP that she gave me a copy of.  No results found for: TSH  Therapeutic Level Labs: No results found for: LITHIUM No results found for: VALPROATE No components found for:  CBMZ  Current  Medications: Current Outpatient Medications  Medication Sig Dispense Refill  . aspirin EC 81 MG tablet Take 81 mg by mouth daily.    Marland Kitchen. atorvastatin (LIPITOR) 10 MG tablet Take 1 tablet (10 mg total) by mouth daily. 90 tablet 3  . fish oil-omega-3 fatty acids 1000 MG capsule Take 3 g by mouth daily.     Marland Kitchen. glipiZIDE (GLUCOTROL) 5 MG tablet Take 5 mg by mouth daily.    . metFORMIN (GLUCOPHAGE) 500 MG tablet Take 500 mg by mouth 2 (two) times daily with a meal.    . metoprolol tartrate (LOPRESSOR) 25 MG tablet Take 1 tablet (25 mg total) by mouth 2 (two) times daily. 180 tablet 3  . niacin 500 MG CR capsule Take 1 capsule (500 mg total) by mouth at bedtime. 30 capsule 6  . nitroGLYCERIN (NITROSTAT) 0.4 MG SL tablet Place 1 tablet (0.4 mg total) under the tongue every 5 (five) minutes as needed. 25 tablet 5  . venlafaxine XR (EFFEXOR-XR) 150 MG 24 hr capsule Take 150 mg by mouth daily with breakfast.    . cephALEXin (KEFLEX) 500 MG capsule Take 1 capsule (500 mg total) by mouth 3 (three) times daily. (Patient not taking: Reported on 03/10/2014) 21 capsule 0  . ciprofloxacin (CIPRO) 500 MG tablet Take 1 tablet (500 mg total) by mouth 2 (two) times daily. (Patient not taking: Reported on 03/11/2018) 14 tablet 0  . lithium 300 MG tablet Take 2 tablets (600 mg total) by mouth daily with supper. 60 tablet 1  . oxyCODONE (ROXICODONE) 5 MG immediate release tablet Take 1 tablet (5 mg total) by mouth every 6 (six) hours as needed for severe pain. (Patient not taking: Reported on 01/24/2014) 30 tablet 0   No current facility-administered medications for this visit.     Medication Side Effects: none  Orders placed this visit:  Orders Placed This Encounter  Procedures  . Lithium level    Psychiatric Specialty Exam:  Review of Systems  Constitutional: Positive for malaise/fatigue.  HENT: Negative.   Eyes: Negative.   Respiratory: Positive for shortness of breath.   Gastrointestinal: Negative.    Genitourinary: Negative.   Musculoskeletal: Negative.   Skin: Negative.   Endo/Heme/Allergies: Negative.     Blood pressure (!) 143/78, pulse 78, height 5\' 5"  (1.651 m), weight 235 lb (106.6 kg).Body mass index is 39.11 kg/m.  General Appearance: Casual, Well Groomed and Obese  Eye Contact:  Good  Speech:  Clear and Coherent  Volume:  Normal  Mood:  Depressed  Affect:  Depressed  Thought Process:  Goal Directed  Orientation:  Full (Time, Place, and Person)  Thought Content: Logical   Suicidal Thoughts:  No  Homicidal Thoughts:  No  Memory:  WNL  Judgement:  Good  Insight:  Good  Psychomotor Activity:  Normal  Concentration:  Concentration: Good and Attention Span: Good  Recall:  Good  Fund of Knowledge: Good  Language: Good  Assets:  Desire for Improvement  ADL's:  Intact  Cognition: WNL  Prognosis:  Good   Screenings:  GAD-7     Office Visit from 03/11/2018 in [facility]  Total GAD-7 Score  5    PHQ2-9     Office Visit from 03/11/2018 in [facility] CARDIAC REHAB MAINTENANCE ORIENTATION from 11/18/2010 in [facility]  PHQ-2 Total Score  6  0  PHQ-9 Total Score  18  -      Receiving Psychotherapy: Yes Ulice Bold, LPC  Treatment Plan/Recommendations: I spent 60 minutes with the patient and at least 50% of that time was spent in counseling in reference to differential diagnosis.  We briefly discussed bipolar disorder but her signs and symptoms do not make me think that is her diagnosis at this point.  She is never had mania.  And no cyclical depression.  We discussed different treatment options for major depressive disorder including SSRIs, SNRIs, Wellbutrin, lithium, TCAs, and other drugs off label.  At this point, I would like to continue Effexor XR 150 mg p.o. daily.  She started this dose last week.  It may take 4 to 6 weeks before we see an improvement in her symptoms.  She understands and agrees. Continue  lithium 600 mg daily with supper.  This drug is great for depression even without having bipolar symptoms.  However if she does well on the Effexor increased dose, I may want to wean her off of the lithium.  She understands that we may or may not DC that medicine. I will repeat lithium level next week.  Instead of twice daily dosing I would like for her to take the lithium 300 mg 2 nightly.  That may make a difference in the lithium level. We discussed therapeutic lifestyle changes such as diet, exercise. Continue psychotherapy with Ulice Bold, LPC. Return in 4 weeks or sooner as needed.   Melony Overly, PA-C

## 2018-03-14 ENCOUNTER — Ambulatory Visit (INDEPENDENT_AMBULATORY_CARE_PROVIDER_SITE_OTHER): Payer: 59 | Admitting: Mental Health

## 2018-03-14 ENCOUNTER — Encounter: Payer: Self-pay | Admitting: Mental Health

## 2018-03-14 DIAGNOSIS — F411 Generalized anxiety disorder: Secondary | ICD-10-CM | POA: Diagnosis not present

## 2018-03-14 DIAGNOSIS — F331 Major depressive disorder, recurrent, moderate: Secondary | ICD-10-CM

## 2018-03-14 NOTE — Progress Notes (Signed)
      [facility] Counselor/Therapist Progress Note  Patient ID: BERKLI GARNTO, MRN: [id number],    Date: 03/14/2018  Time Spent: 45 minutes  Treatment Type: Individual Therapy  Reported Symptoms: Depressed mood, Anxious Mood and Isolation and withdrawal  Mental Status Exam:  Appearance:   Casual     Behavior:  Appropriate and Sharing  Motor:  Normal  Speech/Language:   Normal Rate  Affect:  Depressed  Mood:  anxious and depressed  Thought process:  negative  Thought content:    WNL  Sensory/Perceptual disturbances:    WNL  Orientation:  oriented to person, place and time/date  Attention:  Good  Concentration:  Fair  Memory:  WNL  Fund of knowledge:   Good  Insight:    Good  Judgment:   Good  Impulse Control:  Good   Risk Assessment: Danger to Self:  No Self-injurious Behavior: No Danger to Others: No Duty to Warn:no Physical Aggression / Violence:No  Access to Firearms a concern: No  Gang Involvement:No   Subjective: Downcast, anxious, emotionally negative. Does not want to go back to work yet but returning this week. Does enjoy camping in the spring and summer. Some Seasonal Afffective Disorder apparent.  Interventions: Cognitive Behavioral Therapy, Solution-Oriented/Positive Psychology, Insight-Oriented and Interpersonal  Diagnosis:   ICD-10-CM   1. Major depressive disorder, recurrent episode, moderate (HCC) F33.1   2. Generalized anxiety disorder F41.1     Plan: Stabilize mood           Make medication changes           Self care program           Validation and support  Ulice Bold, [facility]

## 2018-03-28 ENCOUNTER — Ambulatory Visit: Payer: 59 | Admitting: Mental Health

## 2018-04-08 ENCOUNTER — Ambulatory Visit: Payer: 59 | Admitting: Physician Assistant

## 2018-08-23 ENCOUNTER — Emergency Department (HOSPITAL_COMMUNITY)
Admission: EM | Admit: 2018-08-23 | Discharge: 2018-08-23 | Disposition: A | Discharge status: Home / Self Care | Payer: Managed Care, Other (non HMO) | Attending: Emergency Medicine | Admitting: Emergency Medicine

## 2018-08-23 ENCOUNTER — Other Ambulatory Visit: Payer: Self-pay

## 2018-08-23 ENCOUNTER — Encounter (HOSPITAL_COMMUNITY): Payer: Self-pay | Admitting: *Deleted

## 2018-08-23 DIAGNOSIS — Z7984 Long term (current) use of oral hypoglycemic drugs: Secondary | ICD-10-CM | POA: Diagnosis not present

## 2018-08-23 DIAGNOSIS — T782XXA Anaphylactic shock, unspecified, initial encounter: Secondary | ICD-10-CM | POA: Diagnosis not present

## 2018-08-23 DIAGNOSIS — I2510 Atherosclerotic heart disease of native coronary artery without angina pectoris: Secondary | ICD-10-CM | POA: Diagnosis not present

## 2018-08-23 DIAGNOSIS — E1151 Type 2 diabetes mellitus with diabetic peripheral angiopathy without gangrene: Secondary | ICD-10-CM | POA: Diagnosis not present

## 2018-08-23 DIAGNOSIS — E119 Type 2 diabetes mellitus without complications: Secondary | ICD-10-CM | POA: Insufficient documentation

## 2018-08-23 DIAGNOSIS — I252 Old myocardial infarction: Secondary | ICD-10-CM | POA: Insufficient documentation

## 2018-08-23 DIAGNOSIS — I10 Essential (primary) hypertension: Secondary | ICD-10-CM | POA: Insufficient documentation

## 2018-08-23 DIAGNOSIS — F17210 Nicotine dependence, cigarettes, uncomplicated: Secondary | ICD-10-CM | POA: Diagnosis not present

## 2018-08-23 DIAGNOSIS — Z79899 Other long term (current) drug therapy: Secondary | ICD-10-CM | POA: Diagnosis not present

## 2018-08-23 DIAGNOSIS — T7840XA Allergy, unspecified, initial encounter: Secondary | ICD-10-CM | POA: Diagnosis present

## 2018-08-23 MED ORDER — DOXEPIN HCL 25 MG PO CAPS: 25.0000 mg | ORAL_CAPSULE | Freq: Every day | ORAL | 0 refills | Status: DC

## 2018-08-23 MED ORDER — FAMOTIDINE IN NACL 20-0.9 MG/50ML-% IV SOLN
20.0000 mg | Freq: Once | INTRAVENOUS | Status: AC
Start: 2018-08-23 — End: 2018-08-23
  Administered 2018-08-23: 08:00:00 20 mg via INTRAVENOUS
  Filled 2018-08-23: qty 50

## 2018-08-23 MED ORDER — EPINEPHRINE 0.3 MG/0.3ML IJ SOAJ
0.3000 mg | Freq: Once | INTRAMUSCULAR | Status: AC
  Administered 2018-08-23: 07:00:00 0.3 mg via INTRAMUSCULAR

## 2018-08-23 MED ORDER — ALBUTEROL SULFATE HFA 108 (90 BASE) MCG/ACT IN AERS
4.0000 | INHALATION_SPRAY | Freq: Once | RESPIRATORY_TRACT | Status: AC
  Administered 2018-08-23: 4 via RESPIRATORY_TRACT
  Filled 2018-08-23: qty 6.7

## 2018-08-23 MED ORDER — EPINEPHRINE 0.3 MG/0.3ML IJ SOAJ
INTRAMUSCULAR | Status: AC
  Filled 2018-08-23: qty 0.3

## 2018-08-23 MED ORDER — EPINEPHRINE 0.3 MG/0.3ML IJ SOAJ: 0.3000 mg | Freq: Once | INTRAMUSCULAR | 3 refills | Status: AC

## 2018-08-23 NOTE — ED Provider Notes (Addendum)
Feeling much better.  No airway compromise.  No pruritus.   Nat Christen, MD 08/23/18 0300    Nat Christen, MD 08/23/18 [id number]

## 2018-08-23 NOTE — ED Triage Notes (Signed)
Pt brought in by [facility] for c/o severe allergic reaction; pt states she woke up feeling sob; pt states she was able to call her daughter before she fell to the floor and was unable to get up; pt was given benadryl 50mg  and solumedrol 125mg  IV en route by ems; pt is more alert but states she still feels some sob; pt has fine red rash all over body; pt states she had 2 hamburgers before going to bed; pt states she pulled a tick off of her a few months ago

## 2018-08-23 NOTE — Discharge Instructions (Addendum)
Prescription for EpiPen and medication for itching.

## 2018-08-23 NOTE — ED Provider Notes (Signed)
[facility] EMERGENCY DEPARTMENT Provider Note   CSN: [id number] Arrival date & time: 08/23/18  3710    History   Chief Complaint Chief Complaint  Patient presents with  . Allergic Reaction    HPI Christy [name] is a 58 y.o. female.   The history is provided by the patient.  Allergic Reaction She has history of hypertension, diabetes, coronary artery disease, peripheral vascular disease and comes in with an acute allergic reaction.  She woke up this morning with generalized itching and some difficulty breathing.  She fell on the floor and was unable to get up.  EMS was called and noted low blood pressure and urticarial rash.  She was given IV fluids and intravenous diphenhydramine and methylprednisolone.  Blood pressure has come up.  Initial oxygen saturation was 88% and she was placed on supplemental oxygen with improved oxygen saturations.  She denies any change in medication, unusual food or environmental exposure.  Past Medical History:  Diagnosis Date  . 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  . Arthritis   . Chronic kidney disease    recent UTI  . Coronary artery disease   . Depression 2010-06-28   Following sudden death of husband  . Diabetes mellitus, type 2 ([place])   . Hypertension   . Kidney cysts   . Metabolic syndrome    Elevated triglycerides, low HDL, fasting hyperglycemia with low-dose sulfonylurea  . Myocardial infarction ([place]) 09/2010  . PONV (postoperative nausea and vomiting)   . Tobacco abuse    60-90-pack-year history; 1/3 pack per day in 06-28-2011    Patient Active Problem List   Diagnosis Date Noted  . Atherosclerosis of artery of extremity with ulceration ([place]) 01/24/2014  . Ischemic ulcer of right foot ([place]) 11/22/2013  . PAD (peripheral artery disease) ([place]) 10/19/2013  . Atherosclerotic PVD with ulceration ([place]) 10/05/2013  . Metabolic syndrome   . Morbid obesity with BMI of 40.0-44.9, adult ([place])  12/17/2011  . Hyperlipidemia 07/03/2011  . Diabetes mellitus, type 2 ([place])   . Reactive depression (situational) 10/17/2010  . Hypertension 10/17/2010  . Tobacco abuse 10/17/2010  . Arteriosclerotic cardiovascular disease (ASCVD) 09/17/2010    Past Surgical History:  Procedure Laterality Date  . ABDOMINAL AORTAGRAM N/A 10/09/2013   Procedure: ABDOMINAL Maxcine Ham;  Surgeon: Angelia Mould, MD;  Location: [facility] CATH LAB;  Service: Cardiovascular;  Laterality: N/A;  . AMPUTATION Right 10/19/2013   Procedure: AMPUTATION DIGIT-RIGHT 4TH TOE AMPUTATION;  Surgeon: Angelia Mould, MD;  Location: [place];  Service: Vascular;  Laterality: Right;  . APPLICATION OF WOUND VAC Right 11/24/2012   Procedure: APPLICATION OF WOUND VAC;  Surgeon: Marcheta Grammes, DPM;  Location: [facility];  Service: Orthopedics;  Laterality: Right;  . BILATERAL OOPHORECTOMY  27-Jun-2009   for benign mass  . BREAST SURGERY Left    brest biopsy  . CHOLECYSTECTOMY    . CORONARY ANGIOPLASTY WITH STENT PLACEMENT  10/01/10   DES x2  . FEMORAL-POPLITEAL BYPASS GRAFT Right 10/19/2013   Procedure: RIGHT FEMORAL-BELOW KNEE POPLITEAL ARTERY BYPASS GRAFT;  Surgeon: Angelia Mould, MD;  Location: [place];  Service: Vascular;  Laterality: Right;  . INCISION AND DRAINAGE OF WOUND Right 11/24/2012   Procedure: DEBRIDEMENT WOUND FOOT;  Surgeon: Marcheta Grammes, DPM;  Location: [facility];  Service: Orthopedics;  Laterality: Right;  . INCISION AND DRAINAGE OF WOUND Right 03/23/2013   Procedure: DEBRIDEMENT OF INFECTED BONE 5TH METATARSAL RIGHT  FOOT;  Surgeon: Dallas SchimkeBenjamin Ivan McKinney, DPM;  Location: [facility];  Service: Orthopedics;  Laterality: Right;  . INTRAOPERATIVE ARTERIOGRAM Right 10/19/2013   Procedure: INTRA OPERATIVE ARTERIOGRAM;  Surgeon: Chuck Hinthristopher S Dickson, MD;  [facility];  Service: Vascular;  Laterality: Right;  . METATARSAL HEAD EXCISION Right 11/03/2012   Procedure: AMPUTATION OF RIGHT FIFTH TOE AND PORTION OF FIFTH  METATARSAL;  Surgeon: Dallas SchimkeBenjamin Ivan McKinney, DPM;  Location: [facility];  Service: Orthopedics;  Laterality: Right;  . TOE AMPUTATION       OB History   No obstetric history on file.      Home Medications    Prior to Admission medications   Medication Sig Start Date End Date Taking? Authorizing Provider  aspirin EC 81 MG tablet Take 81 mg by mouth daily.    [provider]  atorvastatin (LIPITOR) 10 MG tablet Take 1 tablet (10 mg total) by mouth daily. 10/17/13   Laqueta LindenKoneswaran, Suresh A, MD  cephALEXin (KEFLEX) 500 MG capsule Take 1 capsule (500 mg total) by mouth 3 (three) times daily. Patient not taking: Reported on 03/10/2014 10/21/13   Dara Lordshyne, Samantha J, PA-C  ciprofloxacin (CIPRO) 500 MG tablet Take 1 tablet (500 mg total) by mouth 2 (two) times daily. Patient not taking: Reported on 03/11/2018 03/10/14   Ivery QualeBryant, [place], PA-C  fish oil-omega-3 fatty acids 1000 MG capsule Take 3 g by mouth daily.     [provider]  glipiZIDE (GLUCOTROL) 5 MG tablet Take 5 mg by mouth daily.    [provider]  lithium 300 MG tablet Take 2 tablets (600 mg total) by mouth daily with supper. 03/11/18   Cherie OuchHurst, Teresa T, PA-C  metFORMIN (GLUCOPHAGE) 500 MG tablet Take 500 mg by mouth 2 (two) times daily with a meal.    [provider]  metoprolol tartrate (LOPRESSOR) 25 MG tablet Take 1 tablet (25 mg total) by mouth 2 (two) times daily. 07/20/12   Kathlen Brunswickothbart, Robert M, MD  niacin 500 MG CR capsule Take 1 capsule (500 mg total) by mouth at bedtime. 12/25/11   Dyann KiefLenze, Michele M, PA-C  nitroGLYCERIN (NITROSTAT) 0.4 MG SL tablet Place 1 tablet (0.4 mg total) under the tongue every 5 (five) minutes as needed. 07/20/12   Kathlen Brunswickothbart, Robert M, MD  oxyCODONE (ROXICODONE) 5 MG immediate release tablet Take 1 tablet (5 mg total) by mouth every 6 (six) hours as needed for severe pain. Patient not taking: Reported on 01/24/2014 10/21/13   Dara Lordshyne, Samantha J, PA-C  venlafaxine XR (EFFEXOR-XR) 150 MG 24 hr  capsule Take 150 mg by mouth daily with breakfast. 02/28/18   [provider]    Family History Family History  Problem Relation Age of Onset  . Heart disease Father   . Hyperlipidemia Father   . Hypertension Father   . Heart disease Mother   . Diabetes Mother   . Hyperlipidemia Mother   . Hypertension Mother   . Glaucoma Mother   . Varicose Veins Sister   . Cancer Brother   . Cancer Maternal Grandfather   . Diabetes Maternal Grandmother   . Heart disease Maternal Grandmother   . Hypertension Maternal Grandmother   . COPD Paternal Grandfather   . Hyperlipidemia Sister     Social History Social History   Tobacco Use  . Smoking status: Current Some Day Smoker    Packs/day: 1.00    Years: 30.00    Pack years: 30.00    Types: Cigarettes    Start date:  10/18/1983  . Smokeless tobacco: Never Used  Substance Use Topics  . Alcohol use: No  . Drug use: No     Allergies   Bactrim, Isosorbide mononitrate, and Keflex [cephalexin]   Review of Systems Review of Systems  All other systems reviewed and are negative.    Physical Exam Updated Vital Signs BP 113/78 (BP Location: Right Arm)   Pulse 95   Resp 20   Ht 5\' 5"  (1.651 m)   Wt 109.8 kg   SpO2 91%   BMI 40.27 kg/m   Physical Exam Vitals signs and nursing note reviewed.    Morbidly obese 58 year old female, resting comfortably and in no acute distress. Vital signs are normal. Oxygen saturation is 91%, which is borderline hypoxic. Head is normocephalic and atraumatic. PERRLA, EOMI. Oropharynx is clear.  No edema of the uvula or oropharynx or soft palate. Neck is nontender and supple without adenopathy or JVD. Back is nontender and there is no CVA tenderness. Lungs mild expiratory wheezing.  There are no rales or rhonchi. Chest is nontender. Heart has regular rate and rhythm without murmur. Abdomen is soft, flat, nontender without masses or hepatosplenomegaly and peristalsis is normoactive.  Extremities have no cyanosis or edema, full range of motion is present.  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 Skin is warm and dry.  Generalized erythroderma present. Neurologic: Mental status is normal, cranial nerves are intact, there are no motor or sensory deficits.  ED Treatments / Results   EKG EKG Interpretation  Date/Time:  Tuesday August 23 2018 06:37:21 EDT Ventricular Rate:  101 PR Interval:    QRS Duration: 96 QT Interval:  376 QTC Calculation: 488 R Axis:   -59 Text Interpretation:  Sinus tachycardia Left anterior fascicular block Consider anterior infarct When compared with ECG of 10/09/2013, HEART RATE has increased Confirmed by Dione BoozeGlick, Averey Koning ([id number]) on 08/23/2018 6:44:31 AM  Procedures Procedures  CRITICAL CARE Performed by: Dione Boozeavid Torrian Canion Total critical care time: 35 minutes Critical care time was exclusive of separately billable procedures and treating other patients. Critical care was necessary to treat or prevent imminent or life-threatening deterioration. Critical care was time spent personally by me on the following activities: development of treatment plan with patient and/or surrogate as well as nursing, discussions with consultants, evaluation of patient's response to treatment, examination of patient, obtaining history from patient or surrogate, ordering and performing treatments and interventions, ordering and review of laboratory studies, ordering and review of radiographic studies, pulse oximetry and re-evaluation of patient's condition.  Medications Ordered in ED Medications  famotidine (PEPCID) IVPB 20 mg premix (has no administration in time range)  EPINEPHrine (EPI-PEN) injection 0.3 mg (0.3 mg Intramuscular Given 08/23/18 0639)  albuterol (VENTOLIN HFA) 108 (90 Base) MCG/ACT inhaler 4 puff (4 puffs Inhalation Given 08/23/18 0709)     Initial Impression / Assessment and Plan / ED Course  I have reviewed the triage vital signs and the nursing  notes.  Pertinent labs & imaging results that were available during my care of the patient were reviewed by me and considered in my medical decision making (see chart for details).  Acute anaphylaxis.  She is given intramuscular epinephrine.  Because of wheezing, she is given albuterol inhaler.  She is given famotidine to give additional antihistamine effect.  Old records are reviewed, and she has no relevant past visits.  Case is signed out to Dr. Adriana Simasook.  Final Clinical Impressions(s) / ED Diagnoses   Final diagnoses:  Anaphylaxis,  initial encounter    ED Discharge Orders    None       Dione BoozeGlick, Koden Hunzeker, MD 08/23/18 [phone]

## 2018-10-21 ENCOUNTER — Other Ambulatory Visit: Payer: Self-pay

## 2018-10-21 ENCOUNTER — Encounter: Payer: Self-pay | Admitting: Allergy & Immunology

## 2018-10-21 ENCOUNTER — Ambulatory Visit (INDEPENDENT_AMBULATORY_CARE_PROVIDER_SITE_OTHER): Payer: Managed Care, Other (non HMO) | Admitting: Allergy & Immunology

## 2018-10-21 VITALS — BP 168/78 | HR 88 | Temp 96.5°F | Resp 20 | Ht 65.4 in | Wt 235.0 lb

## 2018-10-21 DIAGNOSIS — J302 Other seasonal allergic rhinitis: Secondary | ICD-10-CM

## 2018-10-21 DIAGNOSIS — T782XXD Anaphylactic shock, unspecified, subsequent encounter: Secondary | ICD-10-CM

## 2018-10-21 DIAGNOSIS — R899 Unspecified abnormal finding in specimens from other organs, systems and tissues: Secondary | ICD-10-CM | POA: Diagnosis not present

## 2018-10-21 MED ORDER — EPINEPHRINE 0.3 MG/0.3ML IJ SOAJ: 0.3000 mg | INTRAMUSCULAR | 3 refills | Status: AC | PRN

## 2018-10-21 NOTE — Progress Notes (Signed)
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 - We are also going to look for a mast cell problem with a tryptase. - We will add on an environmental allergy panel.  - We will send an environmental allergy panel as well. - I am going to avoid doing other food testing since you eat everything without a problem. - We will send in a prescription for an AuviQ. - They will call you to confirm your shipping address. - Anaphylaxis management plan provided.  2. Return in about 6 months (around 04/20/2019). This can be an in-person, a virtual Webex or a telephone follow up visit.   Subjective:   Christy [name] is a 58 y.o. female presenting today for evaluation of  Chief Complaint  Patient presents with  . Allergic Reaction    itching, SOB,   . Sinus Problem    last week    Christy [name] has a history of the following: Patient Active Problem List   Diagnosis Date Noted  . Atherosclerosis of artery of extremity with ulceration (HCC) 01/24/2014  . Ischemic ulcer of right foot (HCC) 11/22/2013  . PAD (peripheral artery disease) (HCC) 10/19/2013  . Atherosclerotic PVD with ulceration (HCC) 10/05/2013  . Metabolic syndrome   . Morbid obesity with BMI of 40.0-44.9, adult (HCC) 12/17/2011  . Hyperlipidemia 07/03/2011  . Diabetes mellitus, type 2 (HCC)   . Reactive depression (situ[place]) 10/17/2010  . Hypertension 10/17/2010  . Tobacco abuse 10/17/2010  . Arteriosclerotic cardiovascular disease (ASCVD) 09/17/2010    History obtained from: chart review and patient.  Christy [name] was referred by Ila McgillAllwardt, Alyssa, PA.     Christy [name] is a 58 y.o. female presenting for an evaluation of anaphylaxis.  She does not remember what she had eaten the  night anything unusual the night before. She denies changes to her diet, detergents, exposures. She has a dog which has been there for around 6 years. She eats red meat very rarely, mostly chicken and fish. She did have red meat the night before the reaction - hamburgers from [employer]. This was not seasoned aside from salt and pepper. She does get tick bites in the spring.   She eats peanut butter and cashews. She does eat bread, eggs, cow's milk, and seafood. She will rarely eat soy sauce. She tolerates a wide variety of foods without any problems whatsoever. She does not think that this is related to any of these foods. She has considered the diagnosis of alpha gal and has done some reading on the disorder.   Allergic Rhinitis Symptom History: She reports hay fever that is worse in the spring time. She also was recently diagnosed with sinusitis and started an anitbiotic within the last couple of days. She has taken completed the antibiotic. She did not need prednisone with this at all. She does use Flonase only as needed. She does not use antihistamines at all.    Otherwise, there is no history of other atopic diseases, including asthma, drug allergies, stinging insect allergies, eczema, urticaria or contact dermatitis. There is no significant infectious history. Vaccinations are up to date.    Past Medical History: Patient Active Problem List   Diagnosis Date Noted  . Atherosclerosis of artery of extremity  with ulceration ([place]) 01/24/2014  . Ischemic ulcer of right foot ([place]) 11/22/2013  . PAD (peripheral artery disease) ([place]) 10/19/2013  . Atherosclerotic PVD with ulceration ([place]) 10/05/2013  . Metabolic syndrome   . Morbid obesity with BMI of 40.0-44.9, adult ([place]) 12/17/2011  . Hyperlipidemia 07/03/2011  . Diabetes mellitus, type 2 ([place])   . Reactive depression (situational) 10/17/2010  . Hypertension 10/17/2010  . Tobacco abuse 10/17/2010  . Arteriosclerotic cardiovascular disease  (ASCVD) 09/17/2010    Medication List:  Allergies as of 10/21/2018      Reactions   Bactrim Diarrhea   Isosorbide Mononitrate Other (See Comments)   Made patient feel as though she was about to have another MI   Keflex [cephalexin]    Yeast infection      Medication List       Accurate as of October 21, 2018  2:43 PM. If you have any questions, ask your nurse or doctor.        STOP taking these medications   cephALEXin 500 MG capsule Commonly known as: KEFLEX Stopped by: Valentina Shaggy, MD   ciprofloxacin 500 MG tablet Commonly known as: CIPRO Stopped by: Valentina Shaggy, MD     TAKE these medications   aspirin EC 81 MG tablet Take 81 mg by mouth daily.   atorvastatin 10 MG tablet Commonly known as: LIPITOR Take 1 tablet (10 mg total) by mouth daily.   doxepin 25 MG capsule Commonly known as: SINEQUAN Take 1 capsule (25 mg total) by mouth at bedtime.   EPINEPHrine 0.3 mg/0.3 mL Soaj injection Commonly known as: Auvi-Q Inject 0.3 mLs (0.3 mg total) into the muscle as needed for up to 2 days for anaphylaxis. Started by: Valentina Shaggy, MD   Farxiga 5 MG Tabs tablet Generic drug: dapagliflozin propanediol Take 5 mg by mouth daily.   fenofibrate 145 MG tablet Commonly known as: TRICOR   fish oil-omega-3 fatty acids 1000 MG capsule Take 3 g by mouth daily.   glipiZIDE 5 MG tablet Commonly known as: GLUCOTROL Take 5 mg by mouth daily.   lisinopril 10 MG tablet Commonly known as: ZESTRIL   lithium 300 MG tablet Take 2 tablets (600 mg total) by mouth daily with supper.   metFORMIN 500 MG tablet Commonly known as: GLUCOPHAGE Take 500 mg by mouth 2 (two) times daily with a meal.   metoprolol tartrate 25 MG tablet Commonly known as: LOPRESSOR Take 1 tablet (25 mg total) by mouth 2 (two) times daily.   niacin 500 MG CR capsule Take 1 capsule (500 mg total) by mouth at bedtime.   nitroGLYCERIN 0.4 MG SL tablet Commonly known as:  NITROSTAT Place 1 tablet (0.4 mg total) under the tongue every 5 (five) minutes as needed.   oxyCODONE 5 MG immediate release tablet Commonly known as: Roxicodone Take 1 tablet (5 mg total) by mouth every 6 (six) hours as needed for severe pain.   venlafaxine XR 150 MG 24 hr capsule Commonly known as: EFFEXOR-XR Take 150 mg by mouth daily with breakfast.       Birth History: non-contributory  Developmental History: non-contributory  Past Surgical History: Past Surgical History:  Procedure Laterality Date  . ABDOMINAL AORTAGRAM N/A 10/09/2013   Procedure: ABDOMINAL Maxcine Ham;  Surgeon: Angelia Mould, MD;  [facility] C[facility] LAB;  Service: Cardiovascular;  Laterality: N/A;  . AMPUTATION Right 10/19/2013   Procedure: AMPUTATION DIGIT-RIGHT 4TH TOE AMPUTATION;  Surgeon: Angelia Mould, MD;  Location: [place];  Service: Vascular;  Laterality: Right;  . APPLICATION OF WOUND VAC Right 11/24/2012   Procedure: APPLICATION OF WOUND VAC;  Surgeon: Dallas Schimke, DPM;  Location[facility];  Service: Orthopedics;  Laterality: Right;  . BILATERAL OOPHORECTOMY  2011   for benign mass  . BREAST SURGERY Left    brest biopsy  . CHOLECYSTECTOMY    . CORONARY ANGIOPLASTY WITH STENT PLACEMENT  10/01/10   DES x2  . FEMORAL-POPLITEAL BYPASS GRAFT Right 10/19/2013   Procedure: RIGHT FEMORAL-BELOW KNEE POPLITEAL ARTERY BYPASS GRAFT;  Surgeon: Chuck Hint, MD;  Location: [facility] OR;  Service: Vascular;  Laterality: Right;  . INCISION AND DRAINAGE OF WOUND Right 11/24/2012   Procedure: DEBRIDEMENT WOUND FOOT;  Surgeon: Dallas Schimke, DPM;  [facility];  Service: Orthopedics;  Laterality: Right;  . INCISION AND DRAINAGE OF WOUND Right 03/23/2013   Procedure: DEBRIDEMENT OF INFECTED BONE 5TH METATARSAL RIGHT FOOT;  Surgeon: Dallas Schimke, DPM;  [facility];  Service: Orthopedics;  Laterality: Right;  . INTRAOPERATIVE ARTERIOGRAM Right 10/19/2013   Procedure: INTRA  OPERATIVE ARTERIOGRAM;  Surgeon: Chuck Hint, MD;  Location: [facility] OR;  Service: Vascular;  Laterality: Right;  . METATARSAL HEAD EXCISION Right 11/03/2012   Procedure: AMPUTATION OF RIGHT FIFTH TOE AND PORTION OF FIFTH METATARSAL;  Surgeon: Dallas Schimke, DPM;  [facility];  Service: Orthopedics;  Laterality: Right;  . TOE AMPUTATION       Family History: Family History  Problem Relation Age of Onset  . Heart disease Father   . Hyperlipidemia Father   . Hypertension Father   . Heart disease Mother   . Diabetes Mother   . Hyperlipidemia Mother   . Hypertension Mother   . Glaucoma Mother   . Varicose Veins Sister   . Cancer Brother   . Cancer Maternal Grandfather   . Diabetes Maternal Grandmother   . Heart disease Maternal Grandmother   . Hypertension Maternal Grandmother   . COPD Paternal Grandfather   . Hyperlipidemia Sister      Social History: Karianna lives at home with her dog. Her husband passed in 2012. 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 She lives in a house that is 58 years old. There is wood flooring throughout the home with some carpeting in the bedrooms. She has electric heating and central cooling. There is the one dog in the home. There are no dust mite coverings on the bedding. She is a smoker (one pack per day for the past 40 years).    Review of Systems  Constitutional: Negative for chills, fever, malaise/fatigue and weight loss.  HENT: Negative.  Negative for congestion, ear discharge, ear pain and sore throat.        Positive for postnasal drip.  Eyes: Negative for pain, discharge and redness.  Respiratory: Negative for cough, sputum production, shortness of breath and wheezing.   Cardiovascular: Negative.  Negative for chest pain and palpitations.  Gastrointestinal: Negative for abdominal pain, heartburn, nausea and vomiting.  Skin: Negative.  Negative for itching and rash.  Neurological: Negative  for dizziness and headaches.  Endo/Heme/Allergies: Negative for environmental allergies. Does not bruise/bleed easily.       Objective:   Blood pressure (!) 168/78, pulse 88, temperature (!) 96.5 F (35.8 C), temperature source Temporal, resp. rate 20, height 5' 5.4" (1.661 m), weight 235 lb (106.6 kg), SpO2 95 %. Body mass index is 38.63 kg/m.   Physical  Exam:   Physical Exam  Constitutional: She appears well-developed.  Obese female.   HENT:  Head: Normocephalic and atraumatic.  Right Ear: Tympanic membrane, external ear and ear canal normal. No drainage, swelling or tenderness. Tympanic membrane is not injected, not scarred, not erythematous, not retracted and not bulging.  Left Ear: Tympanic membrane, external ear and ear canal normal. No drainage, swelling or tenderness. Tympanic membrane is not injected, not scarred, not erythematous, not retracted and not bulging.  Nose: Mucosal edema and rhinorrhea present. No nasal deformity or septal deviation. No epistaxis. Right sinus exhibits no maxillary sinus tenderness and no frontal sinus tenderness. Left sinus exhibits no maxillary sinus tenderness and no frontal sinus tenderness.  Mouth/Throat: Uvula is midline and oropharynx is clear and moist. Mucous membranes are not pale and not dry.  Cobblestoning in the posterior oropharynx.   Eyes: Pupils are equal, round, and reactive to light. Conjunctivae and EOM are normal. Right eye exhibits no chemosis and no discharge. Left eye exhibits no chemosis and no discharge. Right conjunctiva is not injected. Left conjunctiva is not injected.  Cardiovascular: Normal rate, regular rhythm and normal heart sounds.  Respiratory: Effort normal and breath sounds normal. No accessory muscle usage. No tachypnea. No respiratory distress. She has no wheezes. She has no rhonchi. She has no rales. She exhibits no tenderness.  Moving air well in all lung fields.   GI: There is no abdominal tenderness. There  is no rebound and no guarding.  Lymphadenopathy:       Head (right side): No submandibular, no tonsillar and no occipital adenopathy present.       Head (left side): No submandibular, no tonsillar and no occipital adenopathy present.    She has no cervical adenopathy.  Neurological: She is alert.  Skin: No abrasion, no petechiae and no rash noted. Rash is not papular, not vesicular and not urticarial. No erythema. No pallor.  There is some redness on her arms.   Psychiatric: She has a normal mood and affect.     Diagnostic studies: labs sent instead     Malachi BondsJoel Gallagher, MD [facility]

## 2018-10-21 NOTE — Patient Instructions (Addendum)
1. 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 - We are also going to look for a mast cell problem with a tryptase. - We will add on an environmental allergy panel.  - We will send an environmental allergy panel as well. - I am going to avoid doing other food testing since you eat everything without a problem. - We will send in a prescription for an [place]. - They will call you to confirm your shipping address. - Anaphylaxis management plan provided.  2. Return in about 6 months (around 04/20/2019). This can be an in-person, a virtual Webex or a telephone follow up visit.   Please inform us of any Emergency Department visits, hospitalizations, or changes in symptoms. Call us before going to the ED for breathing or allergy symptoms since we might be able to fit you in for a sick visit. Feel free to contact us anytime with any questions, problems, or concerns.  It was a pleasure to meet you today!  Websites that have reliable patient information: 1. American Academy of Asthma, Allergy, and Immunology: www.aaaai.org 2. Food Allergy Research and Education (FARE): foodallergy.org 3. Mothers of Asthmatics: http://www.asthmacommunitynetwork.org 4. American College of Allergy, Asthma, and Immunology: www.acaai.org  Like [place] on [place] and Instagram for our latest updates!      Make sure you are registered to vote! If you have moved or changed any of your contact information, you will need to get this updated before voting!  In some cases, you MAY be able to register to vote online: [email or web address]    Voter ID laws are NOT going into effect for the General Election in November 2020! DO NOT let this stop you from exercising your right to vote!   Absentee voting is the SAFEST way to vote during the coronavirus pandemic!   Download and print an absentee ballot request form at  [email or web address] or you can scan the QR code below with your smart phone:      More information on absentee ballots can be found here: [email or web address]  Alpha-gal and Red Meat Allergy   Overview An allergy to alpha-gal refers to having a severe and potentially life-threatening allergy to a carbohydrate molecule called galactose-alpha-1,3-galactose that is found in most mammalian or red meat. Unlike other food allergies which typically occur within minutes of ingestion, symptoms from eating red meat such as pork, lamb or beef may be delayed, occurring 3-8 hours after eating. Most food allergies are directed against a protein molecule, but alpha-gal is unusual because it is a carbohydrate, and a delay in its absorption may explain the delay in symptoms.  What are the symptoms of an alpha-gal allergy? As with other food allergies, signs or symptoms of an allergy to alpha-gal may include:  Hives and itching   Swelling of your lips, face or eyelids   Shortness of breath, cough or wheezing   Abdominal pain, nausea, diarrhea or vomiting The most severe reaction, anaphylaxis, can present as a combination of several of these symptoms, may include low blood pressure, and is potentially fatal.  Because these symptoms are delayed, you may only wake up with them in the middle of the night after an evening meal.  How is an alpha-gal allergy diagnosed? Diagnosis of this allergy starts with your allergist taking an appropriate history and physical examination. Because the onset is usually quite delayed, it can be hard to associate the symptoms with eating red meat many  hours previously. Triggers include any red meat - including beef, pork, lamb or even horse products. It may occur after eating hotdogs and hamburgers. In very rare cases the reaction may extend to milk or dairy proteins and gelatin.  Your allergist may recommend testing that includes skin tests to the  relevant animal proteins and blood tests which measure the levels of a specific immunoglobulin E (IgE) antibody, to mammalian meats. An investigational blood test, IgE against alpha-gal itself, may also aid in the diagnosis.  How is an alpha-gal allergy treated? Immediate symptoms such as hives or shortness of breath are treated the same as any other food allergy - in an urgent care setting with anti-histamines, epinephrine and other medications. Prevention long-term involves avoidance of all red meat in sensitized individuals. You may be advised to carry an epinephrine auto-injector, to be used in case of subsequent accidental exposures and reaction. These measures do not necessarily mean switching to a full vegetarian diet, since poultry and fish can be consumed and do not cause similar reactions. As with other food allergies, there is the possibility that over time the sensitivity diminishes - although these changes may take many years to become apparent.  How do you become allergic to alpha-gal? Alpha-gal is a molecule carried in the saliva of the Lone Star tick and other potential arthropods typically after feeding on mammalian blood. People that are bitten by the tick, especially those that are bitten repeatedly, are at risk of becoming sensitized and producing the IgE necessary to then cause allergic reactions. Interestingly, allergic reactions may occur to red meat, to subsequent tick bites, and even to medications that contain alpha-gal. Cetuximab is a cancer medication that contains alpha-gal, and people who have had allergic reactions to this medication (these are typically immediate reactions, because it is infused intravenously) have a higher risk for red meat allergy and are likely to have been bitten by ticks in the past. As might be expected, the incidence of tick bites is much higher in the [place] the traditional habitat for the tick. However, cases are now increasingly  reported in the [place]. And it is a phenomenon that has been observed worldwide, with different ticks responsible for similar cases of red meat allergy in many other countries such as [place], [place] and [place].  The discovery of this peculiar allergy has allowed researchers to correlate tick bites with many cases of anaphylaxis that would previously have been classified as idiopathic, or of unknown cause. Also, while it was originally thought that the [employer] tick had to feast on mammalian blood in order to carry the alpha-gal molecule, more recent research has shown that it may carry this molecule and be capable of sensitizing humans independently.  How do you prevent an alpha-gal allergy? Because this allergy is predominantly tick born, you are more likely at risk if you often go outdoors in wooded areas for activities such as hiking, fishing or hunting. The key strategy is to prevent tick bites. This may include wearing long sleeved shirts or pants, using appropriate insect repellants, and surveying for ticks after spending time outdoors. Any observed ticks should be removed carefully by cleaning the site with rubbing alcohol, then using tweezers to pull the ticks head up carefully from the skin using steady pressure. Clean your hands and the site one more time and make sure not to crush the tick between your fingers.

## 2018-10-28 LAB — ALPHA-GAL PANEL
Beef (Bos spp) IgE: 59.2 kU/L — ABNORMAL HIGH (ref <0.35)
Class Interpretation: 3
Class Interpretation: 4
Class Interpretation: 5
Lamb/Mutton (Ovis spp) IgE: 12.2 kU/L — ABNORMAL HIGH (ref <0.35)
Pork (Sus spp) IgE: 30.3 kU/L — ABNORMAL HIGH (ref <0.35)

## 2018-10-28 LAB — IGE+ALLERGENS ZONE 2(30)
Alternaria Alternata IgE: 0.1 kU/L — AB
Amer Sycamore IgE Qn: 0.7 kU/L — AB
Aspergillus Fumigatus IgE: <0.1 kU/L
Bahia Grass IgE: 0.16 kU/L — AB
Bermuda Grass IgE: 0.13 kU/L — AB
Cat Dander IgE: 3.7 kU/L — AB
Cedar, Mountain IgE: 2.11 kU/L — AB
Cladosporium Herbarum IgE: 0.12 kU/L — AB
Cockroach, American IgE: 0.2 kU/L — AB
Common Silver Birch IgE: <0.1 kU/L
D Farinae IgE: 0.44 kU/L — AB
D Pteronyssinus IgE: 0.39 kU/L — AB
Dog Dander IgE: 3.82 kU/L — AB
Elm, American IgE: 0.19 kU/L — AB
Hickory, White IgE: <0.1 kU/L
IgE (Immunoglobulin E), Serum: 4953 IU/mL — ABNORMAL HIGH (ref 6–495)
Johnson Grass IgE: 0.22 kU/L — AB
Maple/Box Elder IgE: 0.15 kU/L — AB
Mucor Racemosus IgE: 0.13 kU/L — AB
Mugwort IgE Qn: 9.19 kU/L — AB
Nettle IgE: 0.3 kU/L — AB
Oak, White IgE: 0.2 kU/L — AB
Penicillium Chrysogen IgE: <0.1 kU/L
Pigweed, Rough IgE: <0.1 kU/L
Plantain, English IgE: 0.19 kU/L — AB
Ragweed, Short IgE: 10.4 kU/L — AB
Sheep Sorrel IgE Qn: 6.33 kU/L — AB
Stemphylium Herbarum IgE: 0.1 kU/L — AB
Sweet gum IgE RAST Ql: <0.1 kU/L
Timothy Grass IgE: 0.23 kU/L — AB
White Mulberry IgE: 4.47 kU/L — AB

## 2018-10-28 LAB — ALLERGEN PROFILE, MOLD
Aureobasidi Pullulans IgE: 0.12 kU/L — AB
Candida Albicans IgE: 0.16 kU/L — AB
M009-IgE Fusarium proliferatum: 0.12 kU/L — AB
M014-IgE Epicoccum purpur: 0.11 kU/L — AB
Phoma Betae IgE: 0.24 kU/L — AB
Setomelanomma Rostrat: <0.1 kU/L

## 2018-10-28 LAB — TRYPTASE: Tryptase: 24.1 ug/L — ABNORMAL HIGH (ref 2.2–13.2)

## 2018-10-31 ENCOUNTER — Other Ambulatory Visit: Payer: Self-pay

## 2018-10-31 MED ORDER — EPINEPHRINE 0.3 MG/0.3ML IJ SOAJ: 0.3000 mg | Freq: Once | INTRAMUSCULAR | 1 refills | Status: AC

## 2018-10-31 NOTE — Addendum Note (Signed)
Addended by: Valere Dross on: 10/31/2018 04:32 PM   Modules accepted: Orders

## 2018-10-31 NOTE — Addendum Note (Signed)
Addended by: Valentina Shaggy on: 10/31/2018 03:23 PM   Modules accepted: Orders

## 2019-02-14 ENCOUNTER — Telehealth: Payer: Self-pay | Admitting: Allergy & Immunology

## 2019-02-14 NOTE — Telephone Encounter (Signed)
This patient needs a repeat tryptase.  I wrote a message about a while ago, but it does not seem that we ever contacted the patient.  We try to reach out to her again sometime?  Salvatore Marvel, MD [facility]

## 2019-02-14 NOTE — Telephone Encounter (Signed)
Called patient on home & mobile number. No answer and unable to leave a message.

## 2019-02-14 NOTE — Telephone Encounter (Signed)
MyChart message has been sent to patient informing her of need for lab to be drawn.

## 2019-07-21 ENCOUNTER — Other Ambulatory Visit (HOSPITAL_COMMUNITY): Payer: Self-pay | Admitting: Physician Assistant

## 2019-07-21 DIAGNOSIS — R319 Hematuria, unspecified: Secondary | ICD-10-CM

## 2019-07-26 ENCOUNTER — Ambulatory Visit (HOSPITAL_COMMUNITY)
Admission: RE | Admit: 2019-07-26 | Discharge: 2019-07-26 | Disposition: A | Discharge status: Home / Self Care | Payer: 59 | Source: Ambulatory Visit | Attending: Physician Assistant | Admitting: Physician Assistant

## 2019-07-26 ENCOUNTER — Other Ambulatory Visit: Payer: Self-pay

## 2019-07-26 DIAGNOSIS — R319 Hematuria, unspecified: Secondary | ICD-10-CM | POA: Insufficient documentation

## 2021-02-06 ENCOUNTER — Encounter (HOSPITAL_COMMUNITY): Payer: Self-pay | Admitting: *Deleted

## 2021-02-06 ENCOUNTER — Emergency Department (HOSPITAL_COMMUNITY)
Admission: EM | Admit: 2021-02-06 | Discharge: 2021-02-06 | Disposition: A | Discharge status: Home / Self Care | Payer: 59 | Attending: Emergency Medicine | Admitting: Emergency Medicine

## 2021-02-06 DIAGNOSIS — Z7984 Long term (current) use of oral hypoglycemic drugs: Secondary | ICD-10-CM | POA: Insufficient documentation

## 2021-02-06 DIAGNOSIS — N12 Tubulo-interstitial nephritis, not specified as acute or chronic: Secondary | ICD-10-CM | POA: Insufficient documentation

## 2021-02-06 DIAGNOSIS — F17210 Nicotine dependence, cigarettes, uncomplicated: Secondary | ICD-10-CM | POA: Insufficient documentation

## 2021-02-06 DIAGNOSIS — E1122 Type 2 diabetes mellitus with diabetic chronic kidney disease: Secondary | ICD-10-CM | POA: Insufficient documentation

## 2021-02-06 DIAGNOSIS — I2510 Atherosclerotic heart disease of native coronary artery without angina pectoris: Secondary | ICD-10-CM | POA: Insufficient documentation

## 2021-02-06 DIAGNOSIS — N189 Chronic kidney disease, unspecified: Secondary | ICD-10-CM | POA: Insufficient documentation

## 2021-02-06 DIAGNOSIS — I129 Hypertensive chronic kidney disease with stage 1 through stage 4 chronic kidney disease, or unspecified chronic kidney disease: Secondary | ICD-10-CM | POA: Insufficient documentation

## 2021-02-06 DIAGNOSIS — Z9049 Acquired absence of other specified parts of digestive tract: Secondary | ICD-10-CM | POA: Insufficient documentation

## 2021-02-06 DIAGNOSIS — Z955 Presence of coronary angioplasty implant and graft: Secondary | ICD-10-CM | POA: Insufficient documentation

## 2021-02-06 DIAGNOSIS — Z7982 Long term (current) use of aspirin: Secondary | ICD-10-CM | POA: Insufficient documentation

## 2021-02-06 LAB — CBC
HCT: 34.1 % — ABNORMAL LOW (ref 36.0–46.0)
Hemoglobin: 11.2 g/dL — ABNORMAL LOW (ref 12.0–15.0)
MCH: 31.5 pg (ref 26.0–34.0)
MCHC: 32.8 g/dL (ref 30.0–36.0)
MCV: 96.1 fL (ref 80.0–100.0)
Platelets: 345 10*3/uL (ref 150–400)
RBC: 3.55 MIL/uL — ABNORMAL LOW (ref 3.87–5.11)
RDW: 13.7 % (ref 11.5–15.5)
WBC: 10.5 10*3/uL (ref 4.0–10.5)
nRBC: 0 % (ref 0.0–0.2)

## 2021-02-06 LAB — BASIC METABOLIC PANEL
Anion gap: 10 (ref 5–15)
BUN: 22 mg/dL — ABNORMAL HIGH (ref 6–20)
CO2: 23 mmol/L (ref 22–32)
Calcium: 8.9 mg/dL (ref 8.9–10.3)
Chloride: 96 mmol/L — ABNORMAL LOW (ref 98–111)
Creatinine, Ser: 1.35 mg/dL — ABNORMAL HIGH (ref 0.44–1.00)
GFR, Estimated: 45 mL/min — ABNORMAL LOW (ref >60)
Glucose, Bld: 204 mg/dL — ABNORMAL HIGH (ref 70–99)
Potassium: 3.8 mmol/L (ref 3.5–5.1)
Sodium: 129 mmol/L — ABNORMAL LOW (ref 135–145)

## 2021-02-06 LAB — URINALYSIS, ROUTINE W REFLEX MICROSCOPIC

## 2021-02-06 LAB — URINALYSIS, MICROSCOPIC (REFLEX)

## 2021-02-06 MED ORDER — SODIUM CHLORIDE 0.9 % IV SOLN: 1.0000 g | Freq: Once | INTRAVENOUS | Status: DC

## 2021-02-06 MED ORDER — CEFTRIAXONE SODIUM 1 G IJ SOLR
1.0000 g | Freq: Once | INTRAMUSCULAR | Status: AC
  Administered 2021-02-06: 18:00:00 1 g via INTRAMUSCULAR
  Filled 2021-02-06: qty 10

## 2021-02-06 MED ORDER — CEFPODOXIME PROXETIL 200 MG PO TABS: 200.0000 mg | ORAL_TABLET | Freq: Two times a day (BID) | ORAL | 0 refills | Status: DC

## 2021-02-06 MED ORDER — LIDOCAINE HCL (PF) 1 % IJ SOLN
INTRAMUSCULAR | Status: AC
  Filled 2021-02-06: qty 2

## 2021-02-06 MED ORDER — ONDANSETRON 4 MG PO TBDP
4.0000 mg | ORAL_TABLET | Freq: Once | ORAL | Status: AC
  Administered 2021-02-06: 17:00:00 4 mg via ORAL
  Filled 2021-02-06: qty 1

## 2021-02-06 NOTE — ED Provider Notes (Signed)
[facility] EMERGENCY DEPARTMENT Provider Note   CSN: [id number] Arrival date & time: 02/06/21  1459     History Chief Complaint  Patient presents with   Urinary Tract Infection    Christy [name] is a 60 y.o. female with history of chronic kidney disease, hypertension, and diabetes who presents to the emergency department with bilateral flank pain that has been intermittent over the last week.  Patient states that she believes this is a "kidney infection" she has had 6 episodes in the last 6 months.  She denies any dysuria, hematuria, urinary frequency, urinary urgency.  She does however report associated subjective fever, chills, nausea, and vomiting.  It has been worsening over the last 3 days.  She has been taking Azo at home with little relief.  She describes her flank pain as a tight sensation.  Nothing seems to make it better.   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   Arthritis    Chronic kidney disease    recent UTI   Coronary artery disease    Depression 2010/05/30   Following sudden death of husband   Diabetes mellitus, type 2 ([place])    Hypertension    Kidney cysts    Metabolic syndrome    Elevated triglycerides, low HDL, fasting hyperglycemia with low-dose sulfonylurea   Myocardial infarction ([place]) 09/2010   PONV (postoperative nausea and vomiting)    Tobacco abuse    60-90-pack-year history; 1/3 pack per day in 30-May-2011    Patient Active Problem List   Diagnosis Date Noted   Atherosclerosis of artery of extremity with ulceration ([place]) 01/24/2014   Ischemic ulcer of right foot ([place]) 11/22/2013   PAD (peripheral artery disease) ([place]) 10/19/2013   Atherosclerotic PVD with ulceration ([place]) [id number]   Metabolic syndrome    Morbid obesity with BMI of 40.0-44.9, adult ([place]) 12/17/2011   Hyperlipidemia 07/03/2011   Diabetes mellitus, type 2 ([place])     Reactive depression (situational) 10/17/2010   Hypertension 10/17/2010   Tobacco abuse 10/17/2010   Arteriosclerotic cardiovascular disease (ASCVD) 09/17/2010    Past Surgical History:  Procedure Laterality Date   ABDOMINAL AORTAGRAM N/A 10/09/2013   Procedure: ABDOMINAL Maxcine Ham;  Surgeon: Angelia Mould, MD;  Location: [facility] CATH LAB;  Service: Cardiovascular;  Laterality: N/A;   AMPUTATION Right 10/19/2013   Procedure: AMPUTATION DIGIT-RIGHT 4TH TOE AMPUTATION;  Surgeon: Angelia Mould, MD;  Location: [facility];  Service: Vascular;  Laterality: Right;   APPLICATION OF WOUND VAC Right 11/24/2012   Procedure: APPLICATION OF WOUND VAC;  Surgeon: Marcheta Grammes, DPM;  Location: [facility];  Service: Orthopedics;  Laterality: Right;   BILATERAL OOPHORECTOMY  May 29, 2009   for benign mass   BREAST SURGERY Left    brest biopsy   CHOLECYSTECTOMY     CORONARY ANGIOPLASTY WITH STENT PLACEMENT  10/01/10   DES x2   FEMORAL-POPLITEAL BYPASS GRAFT Right 10/19/2013   Procedure: RIGHT FEMORAL-BELOW KNEE POPLITEAL ARTERY BYPASS GRAFT;  Surgeon: Angelia Mould, MD;  Location: [facility];  Service: Vascular;  Laterality: Right;   INCISION AND DRAINAGE OF WOUND Right 11/24/2012   Procedure: DEBRIDEMENT WOUND FOOT;  Surgeon: Marcheta Grammes, DPM;  Location: [facility];  Service: Orthopedics;  Laterality: Right;   INCISION AND DRAINAGE OF WOUND Right 03/23/2013   Procedure: DEBRIDEMENT OF INFECTED BONE 5TH METATARSAL RIGHT FOOT;  Surgeon: Dallas Schimke, DPM;  Location: [facility];  Service: Orthopedics;  Laterality: Right;   INTRAOPERATIVE ARTERIOGRAM Right 10/19/2013   Procedure: INTRA OPERATIVE ARTERIOGRAM;  Surgeon: Chuck Hint, MD;  Location[facility];  Service: Vascular;  Laterality: Right;   METATARSAL HEAD EXCISION Right 11/03/2012   Procedure: AMPUTATION OF RIGHT FIFTH TOE AND PORTION OF FIFTH METATARSAL;  Surgeon: Dallas Schimke, DPM;  Location: [facility];  Service: Orthopedics;   Laterality: Right;   TOE AMPUTATION       OB History   No obstetric history on file.     Family History  Problem Relation Age of Onset   Heart disease Father    Hyperlipidemia Father    Hypertension Father    Heart disease Mother    Diabetes Mother    Hyperlipidemia Mother    Hypertension Mother    Glaucoma Mother    Varicose Veins Sister    Cancer Brother    Cancer Maternal Grandfather    Diabetes Maternal Grandmother    Heart disease Maternal Grandmother    Hypertension Maternal Grandmother    COPD Paternal Grandfather    Hyperlipidemia Sister     Social History   Tobacco Use   Smoking status: Some Days    Packs/day: 1.00    Years: 30.00    Pack years: 30.00    Types: Cigarettes    Start date: 10/18/1983   Smokeless tobacco: Never  Vaping Use   Vaping Use: Never used  Substance Use Topics   Alcohol use: No   Drug use: No    Home Medications Prior to Admission medications   Medication Sig Start Date End Date Taking? Authorizing Provider  cefpodoxime (VANTIN) 200 MG tablet Take 1 tablet (200 mg total) by mouth 2 (two) times daily. 02/06/21  Yes Honor Loh M, PA-C  aspirin EC 81 MG tablet Take 81 mg by mouth daily.    [provider]  atorvastatin (LIPITOR) 10 MG tablet Take 1 tablet (10 mg total) by mouth daily. 10/17/13   Laqueta Linden, MD  doxepin (SINEQUAN) 25 MG capsule Take 1 capsule (25 mg total) by mouth at bedtime. 08/23/18   Donnetta Hutching, MD  FARXIGA 5 MG TABS tablet Take 5 mg by mouth daily. 10/08/18   [provider]  fenofibrate (TRICOR) 145 MG tablet  10/15/18   [provider]  fish oil-omega-3 fatty acids 1000 MG capsule Take 3 g by mouth daily.     [provider]  glipiZIDE (GLUCOTROL) 5 MG tablet Take 5 mg by mouth daily.    [provider]  lisinopril (ZESTRIL) 10 MG tablet  10/17/18   [provider]  lithium 300 MG tablet Take 2 tablets (600 mg total) by mouth daily with supper.  03/11/18   Cherie Ouch, PA-C  metFORMIN (GLUCOPHAGE) 500 MG tablet Take 500 mg by mouth 2 (two) times daily with a meal.    [provider]  metoprolol tartrate (LOPRESSOR) 25 MG tablet Take 1 tablet (25 mg total) by mouth 2 (two) times daily. 07/20/12   Kathlen Brunswick, MD  niacin 500 MG CR capsule Take 1 capsule (500 mg total) by mouth at bedtime. 12/25/11   Dyann Kief, PA-C  nitroGLYCERIN (NITROSTAT) 0.4 MG SL tablet Place 1 tablet (0.4 mg total) under the tongue every 5 (five) minutes as needed. 07/20/12   Kathlen Brunswick, MD  oxyCODONE (ROXICODONE) 5 MG immediate release tablet Take 1 tablet (5 mg total)  by mouth every 6 (six) hours as needed for severe pain. 10/21/13   Rhyne, Hulen Shouts, PA-C  venlafaxine XR (EFFEXOR-XR) 150 MG 24 hr capsule Take 150 mg by mouth daily with breakfast. 02/28/18   [provider]    Allergies    Bactrim, Isosorbide mononitrate, and Keflex [cephalexin]  Review of Systems   Review of Systems  All other systems reviewed and are negative.  Physical Exam Updated Vital Signs BP 119/68 (BP Location: Right Arm)    Pulse 88    Temp 98.1 F (36.7 C)    Resp 16    SpO2 99%   Physical Exam Vitals and nursing note reviewed.  Constitutional:      General: She is not in acute distress.    Appearance: Normal appearance.  HENT:     Head: Normocephalic and atraumatic.  Eyes:     General:        Right eye: No discharge.        Left eye: No discharge.  Cardiovascular:     Comments: Regular rate and rhythm.  S1/S2 are distinct without any evidence of murmur, rubs, or gallops.  Radial pulses are 2+ bilaterally.  Dorsalis pedis pulses are 2+ bilaterally.  No evidence of pedal edema. Pulmonary:     Comments: Clear to auscultation bilaterally.  Normal effort.  No respiratory distress.  No evidence of wheezes, rales, or rhonchi heard throughout. Abdominal:     General: Abdomen is flat. Bowel sounds are normal. There is no distension.      Tenderness: There is no abdominal tenderness. There is no right CVA tenderness, left CVA tenderness, guarding or rebound.  Musculoskeletal:        General: Normal range of motion.     Cervical back: Neck supple.  Skin:    General: Skin is warm and dry.     Findings: No rash.  Neurological:     General: No focal deficit present.     Mental Status: She is alert.  Psychiatric:        Mood and Affect: Mood normal.        Behavior: Behavior normal.    ED Results / Procedures / Treatments   Labs (all labs ordered are listed, but only abnormal results are displayed) Labs Reviewed  URINALYSIS, ROUTINE W REFLEX MICROSCOPIC - Abnormal; Notable for the following components:      Result Value   Color, Urine ORANGE (*)    APPearance CLOUDY (*)    Glucose, UA   (*)    Value: TEST NOT REPORTED DUE TO COLOR INTERFERENCE OF URINE PIGMENT   Hgb urine dipstick   (*)    Value: TEST NOT REPORTED DUE TO COLOR INTERFERENCE OF URINE PIGMENT   Bilirubin Urine   (*)    Value: TEST NOT REPORTED DUE TO COLOR INTERFERENCE OF URINE PIGMENT   Ketones, ur   (*)    Value: TEST NOT REPORTED DUE TO COLOR INTERFERENCE OF URINE PIGMENT   Protein, ur   (*)    Value: TEST NOT REPORTED DUE TO COLOR INTERFERENCE OF URINE PIGMENT   Nitrite   (*)    Value: TEST NOT REPORTED DUE TO COLOR INTERFERENCE OF URINE PIGMENT   Leukocytes,Ua   (*)    Value: TEST NOT REPORTED DUE TO COLOR INTERFERENCE OF URINE PIGMENT   All other components within normal limits  BASIC METABOLIC PANEL - Abnormal; Notable for the following components:   Sodium 129 (*)    Chloride 96 (*)  Glucose, Bld 204 (*)    BUN 22 (*)    Creatinine, Ser 1.35 (*)    GFR, Estimated 45 (*)    All other components within normal limits  CBC - Abnormal; Notable for the following components:   RBC 3.55 (*)    Hemoglobin 11.2 (*)    HCT 34.1 (*)    All other components within normal limits  URINALYSIS, MICROSCOPIC (REFLEX) - Abnormal; Notable for the  following components:   Bacteria, UA MANY (*)    All other components within normal limits  URINE CULTURE    EKG None  Radiology No results found.  Procedures Procedures   Medications Ordered in ED Medications  ondansetron (ZOFRAN-ODT) disintegrating tablet 4 mg (4 mg Oral Given 02/06/21 1723)  cefTRIAXone (ROCEPHIN) injection 1 g (1 g Intramuscular Given 02/06/21 1730)  lidocaine (PF) (XYLOCAINE) 1 % injection (  Given 02/06/21 1735)    ED Course  I have reviewed the triage vital signs and the nursing notes.  Pertinent labs & imaging results that were available during my care of the patient were reviewed by me and considered in my medical decision making (see chart for details).  Clinical Course as of 02/06/21 1759  Thu Feb 06, 2021  1724 I confirm with the patient that she is not allergic to cephalosporins.  The allergy listed she gets yeast infections. [CF]    Clinical Course User Index [CF] Cherrie Gauze   MDM Rules/Calculators/A&P                          Christy [name] is a 60 y.o. female who presents the emergency department with bilateral flank pain.  I am suspicious for possible pyelonephritis.  Given that she does not have any cystitis symptoms we will have to wait to see what the UA shows.  Given that she does endorse similar symptoms with her previous urinary tract infections this is likely what is causing her pain.  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  I will hold off on imaging at this time and wait for the lab results to come back. Zofran for nausea.   CBC shows no evidence of leukocytosis.  Chronic ongoing anemia.  BMP shows elevated creatinine which is not too different than her baseline.  Urinalysis did show evidence of urinary tract infection.  Given the clinical scenario, she does fit criteria for pyelonephritis.  I gave her 1 g IM  ceftriaxone in the department.  Clinically she appears well and vital signs are stable.  I believe she would benefit from outpatient trial.  Will also discharge her with a 10-day supply of cefpodoxime twice daily.  Strict return precautions given.  She is safe for discharge.     Final Clinical Impression(s) / ED Diagnoses Final diagnoses:  Pyelonephritis    Rx / DC Orders ED Discharge Orders          Ordered    cefpodoxime (VANTIN) 200 MG tablet  2 times daily        02/06/21 1758             Cherrie Gauze 02/06/21 1800    Luna Fuse, MD 02/13/21 2318

## 2021-02-06 NOTE — Discharge Instructions (Addendum)
Please take antibiotics as prescribed.  Please follow-up with your primary care provider sometime next week.  Please return to the emergency department if you experience worsening symptoms, fevers, intractable vomiting, increased level of pain, or any other concerns you might have.

## 2021-02-06 NOTE — ED Triage Notes (Signed)
Back pain with painful urination x 1 week

## 2021-02-08 LAB — URINE CULTURE

## 2021-02-18 DIAGNOSIS — E11621 Type 2 diabetes mellitus with foot ulcer: Secondary | ICD-10-CM | POA: Diagnosis not present

## 2021-02-18 DIAGNOSIS — R69 Illness, unspecified: Secondary | ICD-10-CM | POA: Diagnosis not present

## 2021-02-18 DIAGNOSIS — L97509 Non-pressure chronic ulcer of other part of unspecified foot with unspecified severity: Secondary | ICD-10-CM | POA: Diagnosis not present

## 2021-02-18 DIAGNOSIS — Z6834 Body mass index (BMI) 34.0-34.9, adult: Secondary | ICD-10-CM | POA: Diagnosis not present

## 2021-02-25 ENCOUNTER — Ambulatory Visit (HOSPITAL_COMMUNITY): Payer: 59 | Attending: Physician Assistant | Admitting: Physical Therapy

## 2021-02-25 ENCOUNTER — Encounter (HOSPITAL_COMMUNITY): Payer: Self-pay | Admitting: Physical Therapy

## 2021-02-25 ENCOUNTER — Other Ambulatory Visit: Payer: Self-pay

## 2021-02-25 DIAGNOSIS — L97519 Non-pressure chronic ulcer of other part of right foot with unspecified severity: Secondary | ICD-10-CM | POA: Diagnosis not present

## 2021-02-25 DIAGNOSIS — R262 Difficulty in walking, not elsewhere classified: Secondary | ICD-10-CM | POA: Diagnosis not present

## 2021-02-25 NOTE — Therapy (Signed)
[place], [place], [address] Phone: [phone]   Fax:  [phone]  Physical Therapy Evaluation  Patient Details  Name: Christy [name] MRN: [id number] Date of Birth: August 30, 1960 No data recorded  Encounter Date: 02/25/2021   PT End of Session - 02/25/21 1145     Visit Number 1    Number of Visits 12    Date for PT Re-Evaluation 04/08/21    Authorization Type Primary None?    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   Arthritis    Chronic kidney disease    recent UTI   Coronary artery disease    Depression 2010/05/20   Following sudden death of husband   Diabetes mellitus, type 2 ([place])    Hypertension    Kidney cysts    Metabolic syndrome    Elevated triglycerides, low HDL, fasting hyperglycemia with low-dose sulfonylurea   Myocardial infarction ([place]) 09/2010   PONV (postoperative nausea and vomiting)    Tobacco abuse    60-90-pack-year history; 1/3 pack per day in 05/20/11    Past Surgical History:  Procedure Laterality Date   ABDOMINAL AORTAGRAM N/A 10/09/2013   Procedure: ABDOMINAL Maxcine Ham;  Surgeon: Angelia Mould, MD;  Location: [facility] CATH LAB;  Service: Cardiovascular;  Laterality: N/A;   AMPUTATION Right 10/19/2013   Procedure: AMPUTATION DIGIT-RIGHT 4TH TOE AMPUTATION;  Surgeon: Angelia Mould, MD;  Location: [place];  Service: Vascular;  Laterality: Right;   APPLICATION OF WOUND VAC Right 11/24/2012   Procedure: APPLICATION OF WOUND VAC;  Surgeon: Marcheta Grammes, DPM;  [facility];  Service: Orthopedics;  Laterality:  Right;   BILATERAL OOPHORECTOMY  2009-05-19   for benign mass   BREAST SURGERY Left    brest biopsy   CHOLECYSTECTOMY     CORONARY ANGIOPLASTY WITH STENT PLACEMENT  10/01/10   DES x2   FEMORAL-POPLITEAL BYPASS GRAFT Right 10/19/2013   Procedure: RIGHT FEMORAL-BELOW KNEE POPLITEAL ARTERY BYPASS GRAFT;  Surgeon: Angelia Mould, MD;  Location: [place];  Service: Vascular;  Laterality: Right;   INCISION AND DRAINAGE OF WOUND Right 11/24/2012   Procedure: DEBRIDEMENT WOUND FOOT;  Surgeon: Marcheta Grammes, DPM;  [facility];  Service: Orthopedics;  Laterality: Right;   INCISION AND DRAINAGE OF WOUND Right 03/23/2013   Procedure: DEBRIDEMENT OF INFECTED BONE 5TH METATARSAL RIGHT FOOT;  Surgeon: Marcheta Grammes, DPM;  [facility];  Service: Orthopedics;  Laterality: Right;   INTRAOPERATIVE ARTERIOGRAM Right 10/19/2013   Procedure: INTRA OPERATIVE ARTERIOGRAM;  Surgeon: Angelia Mould, MD;  Location: [facility];  Service: Vascular;  Laterality: Right;   METATARSAL HEAD EXCISION Right 11/03/2012   Procedure: AMPUTATION OF RIGHT FIFTH TOE AND PORTION OF FIFTH METATARSAL;  Surgeon: Marcheta Grammes, DPM;  [facility];  Service: Orthopedics;  Laterality: Right;   TOE AMPUTATION      There were no vitals filed for this visit.  Objective measurements completed on examination: See above findings.      Wound Therapy - 02/25/21 0001     Subjective Paitent states wound has been present for about a year but has been getting worse. Her leg has been more swollen for the last month. She has treid soaking her foot.    Patient and Family Stated Goals wound to heal    Date of Onset 02/26/20    Pain Scale 0-10    Pain Score 7     Pain Type Chronic pain    Pain Location Foot   RLE   Pain Orientation Lateral   plantar   Wound Properties Date First Assessed: 02/25/21 Time First Assessed: 1100 Wound Type: Diabetic ulcer Location: Foot Location  Orientation: Right Wound Description (Comments): lateral plantar Present on Admission: Yes   Dressing Type Adhesive bandage    Dressing Changed Changed    Dressing Status Clean;Dry    Dressing Change Frequency PRN    Site / Wound Assessment Pink   unable to usualize full depth   Wound Length (cm) 0.2 cm    Wound Width (cm) 0.2 cm    Wound Depth (cm) 1 cm    Wound Volume (cm^3) 0.04 cm^3    Wound Surface Area (cm^2) 0.04 cm^2    Drainage Amount None    Treatment Cleansed;Debridement (Selective)    Selective Debridement - Location plantar wound    Selective Debridement - Tools Used Forceps    Selective Debridement - Tissue Removed callus, dry skin    Wound Therapy - Clinical Statement Patient is a 61 y.o. female who presents to physical therapy with referral for chronic R foot ulcer. Patient with RLE edema, erythema, and tenderness. Patient with lateral plantar wound which is deep but appearing small in length, width. Debrided callus and dry skin from periwound/bed but unable to visualize full depth of wound. Cleansed, debrided, moisturized with lotion and Vaseline for skin protection. Packed wound with medihoney on 2x2 and dressed with 4x4, profore lite with #5 netting. Patient given off weighting shoe for met heads. Educated patient on going to ED if concerned of infection. Educated on leaving dressing on and removing top layer if unable to tolerate compression although it was wrapped lightly. Patient will benefit from skilled physical therapy in order to promote wound healing.    Wound Therapy - Functional Problem List ambulation    Factors Delaying/Impairing Wound Healing Altered sensation;Diabetes Mellitus;Immobility;Multiple medical problems;Vascular compromise    Hydrotherapy Plan Debridement;Dressing change;Electrical stimulation;Patient/family education;Pulsatile lavage with suction    Wound Therapy - Frequency 2X / week    Wound Therapy - Current Recommendations PT    Wound Plan  dressing changes and debridement    Dressing  medihoney on 2x2 packed into wound, 4x4, profore lite, vaseline                      PT Education - 02/25/21 1144     Education Details exam findings, POC, keeping dressing clean    Person(s) Educated Patient    Methods Explanation    Comprehension Verbalized understanding                 PT Long Term Goals - 02/25/21 1147       PT LONG TERM GOAL #1   Title Patient will be independent in donning/doffing compression garments.    Time 6    Period Weeks    Status New    Target Date 04/08/21  PT LONG TERM GOAL #2   Title Patient wound will be healed to reduce risk of infection.    Time 6    Period Weeks    Status New    Target Date 04/08/21                    Plan - 02/25/21 1232     Clinical Impression Statement see above    Personal Factors and Comorbidities Comorbidity 3+;Time since onset of injury/illness/exacerbation;Past/Current Experience;Fitness    Comorbidities CAD, metabolic syndrome, Hx MI, hx toe ambutations, RLE bypass graft, CKD    Examination-Activity Limitations Locomotion Level;Transfers;Squat;Stairs;Stand;Lift;Hygiene/Grooming    Examination-Participation Restrictions Meal Prep;Cleaning;Community Activity;Shop;Volunteer;Laundry;Yard Work    [employer] officer Evolving/Moderate complexity    Clinical Decision Making Moderate    Rehab Potential Fair    PT Frequency 2x / week    PT Duration 6 weeks    PT Treatment/Interventions Gait training;Stair training;Functional mobility training;Therapeutic activities;Therapeutic exercise;DME Instruction;Balance training;Patient/family education;Compression bandaging;Splinting;Taping;Scar mobilization;Ultrasound;Moist Heat;Cryotherapy;Electrical Stimulation;ADLs/Self Care Home Management    PT Next Visit Plan dressing changes and debride as needed    Consulted and Agree with Plan of Care Patient             Patient  will benefit from skilled therapeutic intervention in order to improve the following deficits and impairments:  Abnormal gait, Difficulty walking, Decreased endurance, Pain, Decreased activity tolerance, Decreased balance, Decreased strength, Decreased mobility  Visit Diagnosis: Ulcer of right foot, unspecified ulcer stage ([place])  Difficulty in walking, not elsewhere classified     Problem List Patient Active Problem List   Diagnosis Date Noted   Atherosclerosis of artery of extremity with ulceration ([place]) 01/24/2014   Ischemic ulcer of right foot ([place]) 11/22/2013   PAD (peripheral artery disease) ([place]) 10/19/2013   Atherosclerotic PVD with ulceration ([place]) 87/86/7672   Metabolic syndrome    Morbid obesity with BMI of 40.0-44.9, adult ([place]) 12/17/2011   Hyperlipidemia 07/03/2011   Diabetes mellitus, type 2 ([place])    Reactive depression (situational) 10/17/2010   Hypertension 10/17/2010   Tobacco abuse 10/17/2010   Arteriosclerotic cardiovascular disease (ASCVD) 09/17/2010   12:35 PM, 02/25/21 Mearl Latin PT, DPT Physical Therapist at [facility] [address] [place], [place], [address] Phone: [phone]   Fax:  [phone]  Name: Christy [name] MRN: [id number] Date of Birth: 1960/05/22

## 2021-02-28 ENCOUNTER — Ambulatory Visit (HOSPITAL_COMMUNITY): Payer: 59 | Admitting: Physical Therapy

## 2021-02-28 ENCOUNTER — Other Ambulatory Visit: Payer: Self-pay

## 2021-02-28 ENCOUNTER — Encounter (HOSPITAL_COMMUNITY): Payer: Self-pay | Admitting: Physical Therapy

## 2021-02-28 DIAGNOSIS — L97519 Non-pressure chronic ulcer of other part of right foot with unspecified severity: Secondary | ICD-10-CM

## 2021-02-28 DIAGNOSIS — R262 Difficulty in walking, not elsewhere classified: Secondary | ICD-10-CM | POA: Diagnosis not present

## 2021-02-28 NOTE — Therapy (Signed)
[place], [place], [address] Phone: [phone]   Fax:  [phone]  Wound Care Therapy  Patient Details  Name: Christy [name] MRN: [id number] Date of Birth: [birth year] No data recorded  Encounter Date: 02/28/2021   PT End of Session - 02/28/21 1650     Visit Number 2    Number of Visits 12    Date for PT Re-Evaluation 04/08/21    Authorization Type Primary None?    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   Arthritis    Chronic kidney disease    recent UTI   Coronary artery disease    Depression 06/25/10   Following sudden death of husband   Diabetes mellitus, type 2 ([place])    Hypertension    Kidney cysts    Metabolic syndrome    Elevated triglycerides, low HDL, fasting hyperglycemia with low-dose sulfonylurea   Myocardial infarction ([place]) 09/2010   PONV (postoperative nausea and vomiting)    Tobacco abuse    60-90-pack-year history; 1/3 pack per day in 25-Jun-2011    Past Surgical History:  Procedure Laterality Date   ABDOMINAL AORTAGRAM N/A 10/09/2013   Procedure: ABDOMINAL Maxcine Ham;  Surgeon: Angelia Mould, MD;  Location[facility] CATH LAB;  Service: Cardiovascular;  Laterality: N/A;   AMPUTATION Right 10/19/2013   Procedure: AMPUTATION DIGIT-RIGHT 4TH TOE AMPUTATION;  Surgeon: Angelia Mould, MD;  Location: [place];  Service: Vascular;  Laterality: Right;   APPLICATION OF WOUND VAC Right 11/24/2012   Procedure: APPLICATION OF WOUND VAC;  Surgeon: Marcheta Grammes, DPM;  [facility];  Service: Orthopedics;  Laterality: Right;   BILATERAL OOPHORECTOMY  2009-06-24    for benign mass   BREAST SURGERY Left    brest biopsy   CHOLECYSTECTOMY     CORONARY ANGIOPLASTY WITH STENT PLACEMENT  10/01/10   DES x2   FEMORAL-POPLITEAL BYPASS GRAFT Right 10/19/2013   Procedure: RIGHT FEMORAL-BELOW KNEE POPLITEAL ARTERY BYPASS GRAFT;  Surgeon: Angelia Mould, MD;  Location: [facility];  Service: Vascular;  Laterality: Right;   INCISION AND DRAINAGE OF WOUND Right 11/24/2012   Procedure: DEBRIDEMENT WOUND FOOT;  Surgeon: Marcheta Grammes, DPM;  [facility];  Service: Orthopedics;  Laterality: Right;   INCISION AND DRAINAGE OF WOUND Right 03/23/2013   Procedure: DEBRIDEMENT OF INFECTED BONE 5TH METATARSAL RIGHT FOOT;  Surgeon: Marcheta Grammes, DPM;  [facility];  Service: Orthopedics;  Laterality: Right;   INTRAOPERATIVE ARTERIOGRAM Right 10/19/2013   Procedure: INTRA OPERATIVE ARTERIOGRAM;  Surgeon: Angelia Mould, MD;  Location: [facility];  Service: Vascular;  Laterality: Right;   METATARSAL HEAD EXCISION Right 11/03/2012   Procedure: AMPUTATION OF RIGHT FIFTH TOE AND PORTION OF FIFTH METATARSAL;  Surgeon: Marcheta Grammes, DPM;  [facility];  Service: Orthopedics;  Laterality: Right;   TOE AMPUTATION      There were no vitals filed for this visit.               Wound Therapy -  02/28/21 0001     Subjective Paitent states wound has been present for about a year but has been getting worse. Her leg has been more swollen for the last month. She has treid soaking her foot.    Patient and Family Stated Goals wound to heal    Date of Onset 02/26/20    Pain Scale 0-10    Pain Score 5     Pain Type Chronic pain    Pain Location Foot    Pain Orientation Right    Pain Onset With Activity    Evaluation and Treatment Procedures Explained to Patient/Family Yes    Evaluation and Treatment Procedures agreed to    Wound Properties Date First Assessed: 02/25/21 Time First Assessed: 1100 Wound Type: Diabetic ulcer Location: Foot Location  Orientation: Right Wound Description (Comments): lateral plantar Present on Admission: Yes   Dressing Type Compression wrap   medihoney   Dressing Changed Changed    Dressing Status Dry   outter wrap covered in dog hair   Dressing Change Frequency PRN    Site / Wound Assessment Red    Wound Length (cm) 0.2 cm    Wound Width (cm) 0.2 cm    Wound Depth (cm) 1 cm    Wound Volume (cm^3) 0.04 cm^3    Wound Surface Area (cm^2) 0.04 cm^2    Margins Epibole (rolled edges)    Drainage Amount None    Treatment Cleansed;Other (Comment);Debridement (Selective)   manual for edema control   Selective Debridement - Location wound bed minimal debridment needed    Selective Debridement - Tools Used Forceps    Selective Debridement - Tissue Removed slough    Wound Therapy - Clinical Statement Pt wound has total epiboled edges with a small opening at the base.  Noted redness of foot with swelling an x ray would be recommended to ensure no bone involvement at this point.  Most likely the epiboled edges will need to have a physician to use a nitro stick to induce the healing process to begin.    Wound Therapy - Functional Problem List ambulation    Factors Delaying/Impairing Wound Healing Altered sensation;Diabetes Mellitus;Immobility;Multiple medical problems;Vascular compromise    Hydrotherapy Plan Debridement;Dressing change;Electrical stimulation;Patient/family education;Pulsatile lavage with suction;Other (comment)   manual, Therapist did not charge for debridement secondary to such a short time spent on this procedure today.   Wound Therapy - Frequency 2X / week    Wound Therapy - Current Recommendations PT    Wound Plan Continue with maual to decrease swelling attempt to decrease epiboled edges of wound continue with compression    Dressing  medihoney on 2x2 packed into wound, 4x4, profore lite, vaseline                          PT Long Term Goals - 02/28/21 1651       PT LONG TERM  GOAL #1   Title Patient will be independent in donning/doffing compression garments.    Time 6    Period Weeks    Status On-going    Target Date 04/08/21      PT LONG TERM GOAL #2   Title Patient wound will be healed to reduce risk of infection.    Time 6    Period Weeks    Status On-going    Target Date 04/08/21                   Plan -  02/28/21 1651     Clinical Impression Statement see above    Personal Factors and Comorbidities Comorbidity 3+;Time since onset of injury/illness/exacerbation;Past/Current Experience;Fitness    Comorbidities CAD, metabolic syndrome, Hx MI, hx toe ambutations, RLE bypass graft, CKD    Examination-Activity Limitations Locomotion Level;Transfers;Squat;Stairs;Stand;Lift;Hygiene/Grooming    Examination-Participation Restrictions Meal Prep;Cleaning;Community Activity;Shop;Volunteer;Laundry;Yard Work    [employer] officer Evolving/Moderate complexity    Rehab Potential Fair    PT Frequency 2x / week    PT Duration 6 weeks    PT Treatment/Interventions Gait training;Stair training;Functional mobility training;Therapeutic activities;Therapeutic exercise;DME Instruction;Balance training;Patient/family education;Compression bandaging;Splinting;Taping;Scar mobilization;Ultrasound;Moist Heat;Cryotherapy;Electrical Stimulation;ADLs/Self Care Home Management    PT Next Visit Plan dressing changes and debride as needed    Consulted and Agree with Plan of Care Patient             Patient will benefit from skilled therapeutic intervention in order to improve the following deficits and impairments:  Abnormal gait, Difficulty walking, Decreased endurance, Pain, Decreased activity tolerance, Decreased balance, Decreased strength, Decreased mobility  Visit Diagnosis: Ulcer of right foot, unspecified ulcer stage ([facility])  Difficulty in walking, not elsewhere classified     Problem List Patient Active Problem List   Diagnosis Date Noted    Atherosclerosis of artery of extremity with ulceration ([place]) 01/24/2014   Ischemic ulcer of right foot ([place]) 11/22/2013   PAD (peripheral artery disease) ([place]) 10/19/2013   Atherosclerotic PVD with ulceration ([place]) [id number]   Metabolic syndrome    Morbid obesity with BMI of 40.0-44.9, adult ([place]) 12/17/2011   Hyperlipidemia 07/03/2011   Diabetes mellitus, type 2 ([place])    Reactive depression (situational) 10/17/2010   Hypertension 10/17/2010   Tobacco abuse 10/17/2010   Arteriosclerotic cardiovascular disease (ASCVD) 09/17/2010  Rayetta Humphrey, PT CLT [phone]  02/28/2021, 4:52 PM  [facility] [address] [place], [place], [address] Phone: [phone]   Fax:  [phone]  Name: Christy [name] MRN: [id number] Date of Birth: Jan 25, 1961

## 2021-03-03 ENCOUNTER — Ambulatory Visit (HOSPITAL_COMMUNITY): Payer: 59 | Admitting: Physical Therapy

## 2021-03-03 ENCOUNTER — Telehealth (HOSPITAL_COMMUNITY): Payer: Self-pay | Admitting: Physical Therapy

## 2021-03-03 NOTE — Telephone Encounter (Signed)
She wants to cx next two apptments and find a foot MD. She thinks she needs extensive care from a MD. She will return on 1/23 or call back with update.

## 2021-03-03 NOTE — Telephone Encounter (Signed)
Patient called back to get in for 1 tx this week- foot MD will not see her until 2weeks from today.

## 2021-03-04 ENCOUNTER — Ambulatory Visit (HOSPITAL_COMMUNITY): Payer: 59 | Admitting: Physical Therapy

## 2021-03-04 ENCOUNTER — Other Ambulatory Visit: Payer: Self-pay

## 2021-03-04 DIAGNOSIS — L97519 Non-pressure chronic ulcer of other part of right foot with unspecified severity: Secondary | ICD-10-CM

## 2021-03-04 DIAGNOSIS — R262 Difficulty in walking, not elsewhere classified: Secondary | ICD-10-CM | POA: Diagnosis not present

## 2021-03-04 NOTE — Therapy (Signed)
[place], [place], [address] Phone: [phone]   Fax:  [phone]  Wound Care Therapy  Patient Details  Name: Christy [name] MRN: [id number] Date of Birth: 03-03-1960 No data recorded  Encounter Date: 03/04/2021   PT End of Session - 03/04/21 1127     Visit Number 3    Number of Visits 12    Date for PT Re-Evaluation 04/08/21    Authorization Type Primary None?    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   Arthritis    Chronic kidney disease    recent UTI   Coronary artery disease    Depression 06/20/2010   Following sudden death of husband   Diabetes mellitus, type 2 ([place])    Hypertension    Kidney cysts    Metabolic syndrome    Elevated triglycerides, low HDL, fasting hyperglycemia with low-dose sulfonylurea   Myocardial infarction ([place]) 09/2010   PONV (postoperative nausea and vomiting)    Tobacco abuse    60-90-pack-year history; 1/3 pack per day in 06-20-11    Past Surgical History:  Procedure Laterality Date   ABDOMINAL AORTAGRAM N/A 10/09/2013   Procedure: ABDOMINAL Maxcine Ham;  Surgeon: Angelia Mould, MD;  Location[facility];  Service: Cardiovascular;  Laterality: N/A;   AMPUTATION Right 10/19/2013   Procedure: AMPUTATION DIGIT-RIGHT 4TH TOE AMPUTATION;  Surgeon: Angelia Mould, MD;  Location: [facility];  Service: Vascular;  Laterality: Right;   APPLICATION OF WOUND VAC Right 11/24/2012   Procedure: APPLICATION OF WOUND VAC;  Surgeon: Marcheta Grammes, DPM;  Location: [facility];  Service: Orthopedics;  Laterality: Right;   BILATERAL OOPHORECTOMY  06-19-09    for benign mass   BREAST SURGERY Left    brest biopsy   CHOLECYSTECTOMY     CORONARY ANGIOPLASTY WITH STENT PLACEMENT  10/01/10   DES x2   FEMORAL-POPLITEAL BYPASS GRAFT Right 10/19/2013   Procedure: RIGHT FEMORAL-BELOW KNEE POPLITEAL ARTERY BYPASS GRAFT;  Surgeon: Angelia Mould, MD;  Location: [facility];  Service: Vascular;  Laterality: Right;   INCISION AND DRAINAGE OF WOUND Right 11/24/2012   Procedure: DEBRIDEMENT WOUND FOOT;  Surgeon: Marcheta Grammes, DPM;  Location: [facility];  Service: Orthopedics;  Laterality: Right;   INCISION AND DRAINAGE OF WOUND Right 03/23/2013   Procedure: DEBRIDEMENT OF INFECTED BONE 5TH METATARSAL RIGHT FOOT;  Surgeon: Marcheta Grammes, DPM;  Location: [facility];  Service: Orthopedics;  Laterality: Right;   INTRAOPERATIVE ARTERIOGRAM Right 10/19/2013   Procedure: INTRA OPERATIVE ARTERIOGRAM;  Surgeon: Angelia Mould, MD;  Location: [facility];  Service: Vascular;  Laterality: Right;   METATARSAL HEAD EXCISION Right 11/03/2012   Procedure: AMPUTATION OF RIGHT FIFTH TOE AND PORTION OF FIFTH METATARSAL;  Surgeon: Marcheta Grammes, DPM;  Location: [facility];  Service: Orthopedics;  Laterality: Right;   TOE AMPUTATION      There were no vitals filed for this visit.               Wound Therapy -  03/04/21 0001     Subjective pt states she has appointment with foot Doctor on the 30th and they want her to come here until then 1X week    Patient and Family Stated Goals wound to heal    Date of Onset 02/26/20    Pain Scale 0-10    Pain Score 5     Pain Type Chronic pain    Pain Location Foot    Pain Orientation Right    Pain Onset With Activity    Evaluation and Treatment Procedures Explained to Patient/Family Yes    Evaluation and Treatment Procedures agreed to    Wound Properties Date First Assessed: 02/25/21 Time First Assessed: 1100 Wound Type: Diabetic ulcer Location: Foot Location Orientation: Right Wound Description (Comments): lateral  plantar Present on Admission: Yes   Wound Image Images linked: 1    Dressing Type Compression wrap   medihoney gel   Dressing Changed Changed    Dressing Status Dry;Intact;Old drainage    Dressing Change Frequency PRN    Site / Wound Assessment Red    % Wound base Red or Granulating --   unknown; small hole with unknown depth at least 1 cm   Wound Length (cm) 0.2 cm    Wound Width (cm) 0.2 cm    Wound Depth (cm) 1 cm    Wound Volume (cm^3) 0.04 cm^3    Wound Surface Area (cm^2) 0.04 cm^2    Margins Epibole (rolled edges)    Drainage Amount Scant    Treatment Cleansed;Debridement (Selective)    Selective Debridement - Location wound bed minimal debridment needed    Selective Debridement - Tools Used Forceps    Selective Debridement - Tissue Removed slough    Wound Therapy - Clinical Statement continues to be small opeining with depth and scant drainage.  petiemter red up LE towards knee and edema around foot region.  Pt reports sensitivity and redness has been there but with increased edema in foot (pictures taken).  Cleansed well and debrided opening of wound.  Continued packing with medihoney gel and profore lite to help with edema.  Pt reported overall comfort with dressing at EOS.  No odor present.    Wound Therapy - Functional Problem List ambulation    Factors Delaying/Impairing Wound Healing Altered sensation;Diabetes Mellitus;Immobility;Multiple medical problems;Vascular compromise    Hydrotherapy Plan Debridement;Dressing change;Electrical stimulation;Patient/family education;Pulsatile lavage with suction;Other (comment)    Wound Therapy - Frequency 2X / week    Wound Therapy - Current Recommendations PT    Wound Plan attempt to decrease epiboled edges of wound continue with compression    Dressing  medihoney on 2x2 packed into wound, 4x4, profore lite, vaseline                          PT Long Term Goals - 02/28/21 1651       PT LONG TERM GOAL #1   Title  Patient will be independent in donning/doffing compression garments.    Time 6    Period Weeks    Status On-going    Target Date 04/08/21      PT LONG TERM GOAL #2   Title Patient wound will be healed to reduce risk of infection.    Time 6    Period Weeks    Status On-going    Target Date 04/08/21                    Patient  will benefit from skilled therapeutic intervention in order to improve the following deficits and impairments:     Visit Diagnosis: Ulcer of right foot, unspecified ulcer stage [facility])     Problem List Patient Active Problem List   Diagnosis Date Noted   Atherosclerosis of artery of extremity with ulceration ([place]) 01/24/2014   Ischemic ulcer of right foot ([place]) 11/22/2013   PAD (peripheral artery disease) ([place]) 10/19/2013   Atherosclerotic PVD with ulceration ([place]) [id number]   Metabolic syndrome    Morbid obesity with BMI of 40.0-44.9, adult ([place]) 12/17/2011   Hyperlipidemia 07/03/2011   Diabetes mellitus, type 2 ([place])    Reactive depression (situational) 10/17/2010   Hypertension 10/17/2010   Tobacco abuse 10/17/2010   Arteriosclerotic cardiovascular disease (ASCVD) 09/17/2010   Teena Irani, PTA/CLT, WTA [phone]  Teena Irani, PTA 03/04/2021, 11:28 AM  [facility] [address] [place], [place], [address] Phone: [phone]   Fax:  [phone]  Name: Christy [name] MRN: [id number] Date of Birth: 08/25/1960

## 2021-03-05 ENCOUNTER — Ambulatory Visit (HOSPITAL_COMMUNITY): Payer: 59 | Admitting: Physical Therapy

## 2021-03-10 ENCOUNTER — Emergency Department (HOSPITAL_COMMUNITY): Payer: 59

## 2021-03-10 ENCOUNTER — Other Ambulatory Visit: Payer: Self-pay

## 2021-03-10 ENCOUNTER — Inpatient Hospital Stay (HOSPITAL_COMMUNITY)
Admission: EM | Admit: 2021-03-10 | Discharge: 2021-03-16 | DRG: 617 | Disposition: A | Discharge status: Home Health | Payer: 59 | Attending: Internal Medicine | Admitting: Internal Medicine

## 2021-03-10 ENCOUNTER — Ambulatory Visit (HOSPITAL_COMMUNITY): Payer: 59 | Admitting: Physical Therapy

## 2021-03-10 ENCOUNTER — Encounter (HOSPITAL_COMMUNITY): Payer: Self-pay

## 2021-03-10 DIAGNOSIS — L03115 Cellulitis of right lower limb: Secondary | ICD-10-CM | POA: Diagnosis present

## 2021-03-10 DIAGNOSIS — E11621 Type 2 diabetes mellitus with foot ulcer: Secondary | ICD-10-CM | POA: Diagnosis present

## 2021-03-10 DIAGNOSIS — Z79899 Other long term (current) drug therapy: Secondary | ICD-10-CM | POA: Diagnosis not present

## 2021-03-10 DIAGNOSIS — I252 Old myocardial infarction: Secondary | ICD-10-CM

## 2021-03-10 DIAGNOSIS — N1832 Chronic kidney disease, stage 3b: Secondary | ICD-10-CM | POA: Diagnosis not present

## 2021-03-10 DIAGNOSIS — F17210 Nicotine dependence, cigarettes, uncomplicated: Secondary | ICD-10-CM | POA: Diagnosis present

## 2021-03-10 DIAGNOSIS — E1151 Type 2 diabetes mellitus with diabetic peripheral angiopathy without gangrene: Secondary | ICD-10-CM | POA: Diagnosis not present

## 2021-03-10 DIAGNOSIS — I10 Essential (primary) hypertension: Secondary | ICD-10-CM | POA: Diagnosis present

## 2021-03-10 DIAGNOSIS — L97919 Non-pressure chronic ulcer of unspecified part of right lower leg with unspecified severity: Secondary | ICD-10-CM | POA: Diagnosis not present

## 2021-03-10 DIAGNOSIS — D631 Anemia in chronic kidney disease: Secondary | ICD-10-CM | POA: Diagnosis present

## 2021-03-10 DIAGNOSIS — E1122 Type 2 diabetes mellitus with diabetic chronic kidney disease: Secondary | ICD-10-CM | POA: Diagnosis present

## 2021-03-10 DIAGNOSIS — L97519 Non-pressure chronic ulcer of other part of right foot with unspecified severity: Secondary | ICD-10-CM | POA: Diagnosis present

## 2021-03-10 DIAGNOSIS — M7989 Other specified soft tissue disorders: Secondary | ICD-10-CM | POA: Diagnosis not present

## 2021-03-10 DIAGNOSIS — L97509 Non-pressure chronic ulcer of other part of unspecified foot with unspecified severity: Secondary | ICD-10-CM | POA: Diagnosis not present

## 2021-03-10 DIAGNOSIS — G8918 Other acute postprocedural pain: Secondary | ICD-10-CM | POA: Diagnosis not present

## 2021-03-10 DIAGNOSIS — E871 Hypo-osmolality and hyponatremia: Secondary | ICD-10-CM | POA: Diagnosis not present

## 2021-03-10 DIAGNOSIS — E1169 Type 2 diabetes mellitus with other specified complication: Secondary | ICD-10-CM | POA: Diagnosis not present

## 2021-03-10 DIAGNOSIS — Z955 Presence of coronary angioplasty implant and graft: Secondary | ICD-10-CM | POA: Diagnosis not present

## 2021-03-10 DIAGNOSIS — Z20822 Contact with and (suspected) exposure to covid-19: Secondary | ICD-10-CM | POA: Diagnosis not present

## 2021-03-10 DIAGNOSIS — M86171 Other acute osteomyelitis, right ankle and foot: Secondary | ICD-10-CM

## 2021-03-10 DIAGNOSIS — Z7982 Long term (current) use of aspirin: Secondary | ICD-10-CM

## 2021-03-10 DIAGNOSIS — I70221 Atherosclerosis of native arteries of extremities with rest pain, right leg: Secondary | ICD-10-CM | POA: Diagnosis not present

## 2021-03-10 DIAGNOSIS — Z9049 Acquired absence of other specified parts of digestive tract: Secondary | ICD-10-CM

## 2021-03-10 DIAGNOSIS — Z95828 Presence of other vascular implants and grafts: Secondary | ICD-10-CM | POA: Diagnosis not present

## 2021-03-10 DIAGNOSIS — E8881 Metabolic syndrome: Secondary | ICD-10-CM | POA: Diagnosis present

## 2021-03-10 DIAGNOSIS — Z7984 Long term (current) use of oral hypoglycemic drugs: Secondary | ICD-10-CM | POA: Diagnosis not present

## 2021-03-10 DIAGNOSIS — Z881 Allergy status to other antibiotic agents status: Secondary | ICD-10-CM

## 2021-03-10 DIAGNOSIS — M869 Osteomyelitis, unspecified: Secondary | ICD-10-CM | POA: Diagnosis not present

## 2021-03-10 DIAGNOSIS — I96 Gangrene, not elsewhere classified: Secondary | ICD-10-CM | POA: Diagnosis not present

## 2021-03-10 DIAGNOSIS — Z8249 Family history of ischemic heart disease and other diseases of the circulatory system: Secondary | ICD-10-CM

## 2021-03-10 DIAGNOSIS — Z888 Allergy status to other drugs, medicaments and biological substances status: Secondary | ICD-10-CM

## 2021-03-10 DIAGNOSIS — E1152 Type 2 diabetes mellitus with diabetic peripheral angiopathy with gangrene: Secondary | ICD-10-CM | POA: Diagnosis not present

## 2021-03-10 DIAGNOSIS — Z87828 Personal history of other (healed) physical injury and trauma: Secondary | ICD-10-CM | POA: Diagnosis not present

## 2021-03-10 DIAGNOSIS — E11610 Type 2 diabetes mellitus with diabetic neuropathic arthropathy: Secondary | ICD-10-CM | POA: Diagnosis present

## 2021-03-10 DIAGNOSIS — I129 Hypertensive chronic kidney disease with stage 1 through stage 4 chronic kidney disease, or unspecified chronic kidney disease: Secondary | ICD-10-CM | POA: Diagnosis present

## 2021-03-10 DIAGNOSIS — E1140 Type 2 diabetes mellitus with diabetic neuropathy, unspecified: Secondary | ICD-10-CM | POA: Diagnosis present

## 2021-03-10 DIAGNOSIS — R600 Localized edema: Secondary | ICD-10-CM | POA: Diagnosis not present

## 2021-03-10 DIAGNOSIS — R262 Difficulty in walking, not elsewhere classified: Secondary | ICD-10-CM

## 2021-03-10 DIAGNOSIS — I2510 Atherosclerotic heart disease of native coronary artery without angina pectoris: Secondary | ICD-10-CM | POA: Diagnosis present

## 2021-03-10 DIAGNOSIS — E119 Type 2 diabetes mellitus without complications: Secondary | ICD-10-CM

## 2021-03-10 DIAGNOSIS — Z90722 Acquired absence of ovaries, bilateral: Secondary | ICD-10-CM

## 2021-03-10 DIAGNOSIS — Z6835 Body mass index (BMI) 35.0-35.9, adult: Secondary | ICD-10-CM | POA: Diagnosis not present

## 2021-03-10 DIAGNOSIS — I743 Embolism and thrombosis of arteries of the lower extremities: Secondary | ICD-10-CM | POA: Diagnosis not present

## 2021-03-10 DIAGNOSIS — Z89421 Acquired absence of other right toe(s): Secondary | ICD-10-CM

## 2021-03-10 DIAGNOSIS — M14671 Charcot's joint, right ankle and foot: Secondary | ICD-10-CM | POA: Diagnosis not present

## 2021-03-10 DIAGNOSIS — M86161 Other acute osteomyelitis, right tibia and fibula: Secondary | ICD-10-CM | POA: Diagnosis not present

## 2021-03-10 DIAGNOSIS — I739 Peripheral vascular disease, unspecified: Secondary | ICD-10-CM | POA: Diagnosis not present

## 2021-03-10 DIAGNOSIS — Z833 Family history of diabetes mellitus: Secondary | ICD-10-CM

## 2021-03-10 LAB — URINALYSIS, ROUTINE W REFLEX MICROSCOPIC
Bilirubin Urine: NEGATIVE
Glucose, UA: 50 mg/dL — AB
Hgb urine dipstick: NEGATIVE
Ketones, ur: 5 mg/dL — AB
Leukocytes,Ua: NEGATIVE
Nitrite: NEGATIVE
Protein, ur: >=300 mg/dL — AB
Specific Gravity, Urine: 1.025 (ref 1.005–1.030)
pH: 5 (ref 5.0–8.0)

## 2021-03-10 LAB — BASIC METABOLIC PANEL
Anion gap: 13 (ref 5–15)
BUN: 23 mg/dL — ABNORMAL HIGH (ref 6–20)
CO2: 23 mmol/L (ref 22–32)
Calcium: 9 mg/dL (ref 8.9–10.3)
Chloride: 94 mmol/L — ABNORMAL LOW (ref 98–111)
Creatinine, Ser: 1.47 mg/dL — ABNORMAL HIGH (ref 0.44–1.00)
GFR, Estimated: 41 mL/min — ABNORMAL LOW (ref >60)
Glucose, Bld: 155 mg/dL — ABNORMAL HIGH (ref 70–99)
Potassium: 4.6 mmol/L (ref 3.5–5.1)
Sodium: 130 mmol/L — ABNORMAL LOW (ref 135–145)

## 2021-03-10 LAB — CBC WITH DIFFERENTIAL/PLATELET
Abs Immature Granulocytes: 0.5 10*3/uL — ABNORMAL HIGH (ref 0.00–0.07)
Basophils Absolute: 0.1 10*3/uL (ref 0.0–0.1)
Basophils Relative: 0 %
Eosinophils Absolute: 0.1 10*3/uL (ref 0.0–0.5)
Eosinophils Relative: 0 %
HCT: 30.4 % — ABNORMAL LOW (ref 36.0–46.0)
Hemoglobin: 9 g/dL — ABNORMAL LOW (ref 12.0–15.0)
Immature Granulocytes: 5 %
Lymphocytes Relative: 14 %
Lymphs Abs: 1.5 10*3/uL (ref 0.7–4.0)
MCH: 27.6 pg (ref 26.0–34.0)
MCHC: 29.6 g/dL — ABNORMAL LOW (ref 30.0–36.0)
MCV: 93.3 fL (ref 80.0–100.0)
Monocytes Absolute: 0.5 10*3/uL (ref 0.1–1.0)
Monocytes Relative: 4 %
Neutro Abs: 8.5 10*3/uL — ABNORMAL HIGH (ref 1.7–7.7)
Neutrophils Relative %: 77 %
Platelets: 516 10*3/uL — ABNORMAL HIGH (ref 150–400)
RBC: 3.26 MIL/uL — ABNORMAL LOW (ref 3.87–5.11)
RDW: 15.6 % — ABNORMAL HIGH (ref 11.5–15.5)
WBC: 11.1 10*3/uL — ABNORMAL HIGH (ref 4.0–10.5)
nRBC: 0 % (ref 0.0–0.2)

## 2021-03-10 LAB — RESP PANEL BY RT-PCR (FLU A&B, COVID) ARPGX2
Influenza A by PCR: NEGATIVE
Influenza B by PCR: NEGATIVE
SARS Coronavirus 2 by RT PCR: NEGATIVE

## 2021-03-10 LAB — CBG MONITORING, ED: Glucose-Capillary: 169 mg/dL — ABNORMAL HIGH (ref 70–99)

## 2021-03-10 MED ORDER — ACETAMINOPHEN 650 MG RE SUPP: 650.0000 mg | Freq: Four times a day (QID) | RECTAL | Status: DC | PRN

## 2021-03-10 MED ORDER — ONDANSETRON HCL 4 MG/2ML IJ SOLN
4.0000 mg | Freq: Four times a day (QID) | INTRAMUSCULAR | Status: DC | PRN
  Administered 2021-03-14: 4 mg via INTRAVENOUS

## 2021-03-10 MED ORDER — ASPIRIN EC 81 MG PO TBEC
81.0000 mg | DELAYED_RELEASE_TABLET | Freq: Every day | ORAL | Status: DC
  Administered 2021-03-11 – 2021-03-16 (×6): 81 mg via ORAL
  Filled 2021-03-10 (×6): qty 1

## 2021-03-10 MED ORDER — ONDANSETRON 4 MG PO TBDP
4.0000 mg | ORAL_TABLET | Freq: Once | ORAL | Status: AC
  Administered 2021-03-10: 4 mg via ORAL
  Filled 2021-03-10: qty 1

## 2021-03-10 MED ORDER — METOPROLOL TARTRATE 25 MG PO TABS
25.0000 mg | ORAL_TABLET | Freq: Two times a day (BID) | ORAL | Status: DC
  Administered 2021-03-11 – 2021-03-16 (×9): 25 mg via ORAL
  Filled 2021-03-10 (×11): qty 1

## 2021-03-10 MED ORDER — POTASSIUM CHLORIDE CRYS ER 20 MEQ PO TBCR
40.0000 meq | EXTENDED_RELEASE_TABLET | Freq: Once | ORAL | Status: AC
  Administered 2021-03-10: 40 meq via ORAL
  Filled 2021-03-10: qty 2

## 2021-03-10 MED ORDER — ATORVASTATIN CALCIUM 10 MG PO TABS
20.0000 mg | ORAL_TABLET | Freq: Every evening | ORAL | Status: DC
  Administered 2021-03-11 – 2021-03-15 (×5): 20 mg via ORAL
  Filled 2021-03-10 (×5): qty 2

## 2021-03-10 MED ORDER — CITALOPRAM HYDROBROMIDE 20 MG PO TABS
20.0000 mg | ORAL_TABLET | Freq: Every day | ORAL | Status: DC
  Administered 2021-03-11 – 2021-03-16 (×6): 20 mg via ORAL
  Filled 2021-03-10 (×6): qty 1

## 2021-03-10 MED ORDER — INSULIN ASPART 100 UNIT/ML IJ SOLN
0.0000 [IU] | INTRAMUSCULAR | Status: DC
  Administered 2021-03-11: 10:00:00 1 [IU] via SUBCUTANEOUS
  Administered 2021-03-11 (×3): 2 [IU] via SUBCUTANEOUS
  Administered 2021-03-12: 1 [IU] via SUBCUTANEOUS
  Filled 2021-03-10 (×4): qty 1

## 2021-03-10 MED ORDER — ONDANSETRON HCL 4 MG PO TABS: 4.0000 mg | ORAL_TABLET | Freq: Four times a day (QID) | ORAL | Status: DC | PRN

## 2021-03-10 MED ORDER — VANCOMYCIN HCL IN DEXTROSE 1-5 GM/200ML-% IV SOLN
1000.0000 mg | Freq: Once | INTRAVENOUS | Status: AC
  Administered 2021-03-10: 1000 mg via INTRAVENOUS
  Filled 2021-03-10: qty 200

## 2021-03-10 MED ORDER — POLYETHYLENE GLYCOL 3350 17 G PO PACK: 17.0000 g | PACK | Freq: Every day | ORAL | Status: DC | PRN

## 2021-03-10 MED ORDER — LISINOPRIL 10 MG PO TABS
10.0000 mg | ORAL_TABLET | Freq: Every day | ORAL | Status: DC
  Administered 2021-03-11 – 2021-03-16 (×6): 10 mg via ORAL
  Filled 2021-03-10 (×6): qty 1

## 2021-03-10 MED ORDER — ACETAMINOPHEN 325 MG PO TABS: 650.0000 mg | ORAL_TABLET | Freq: Four times a day (QID) | ORAL | Status: DC | PRN

## 2021-03-10 NOTE — ED Triage Notes (Signed)
Patient reports nausea with one episode of vomiting PTA. Denies abdominal pain.

## 2021-03-10 NOTE — H&P (Addendum)
History and Physical    ZEPPELIN BECKSTRAND [id number] DOB: [birth year] DOA: 03/10/2021  PCP: Wanita Chamberlain, PA-C   Patient coming from: Home  I have personally briefly reviewed patient's old medical records in [facility]  Chief Complaint: Right leg pain and swelling  HPI: MILYNN QUIRION is a 61 y.o. female with medical history significant for peripheral vascular disease with amputation of right fourth and fifth metatarsals + phalanges.  Diabetes mellitus, hypertension, CKD. Patient presented to the ED with complaints of ulcer on her right foot and swelling.  Also has chronic, and she follows with wound care for this.  She reports swelling to her right foot over the past month.  She reports pain in her foot is present when she bears weight.  She went to the outpatient rehab center today, patient vomited, reported feeling weak and dizzy, was referred to the ED.  ED Course: Temperature 97.7.  Heart rate 56-69.  WBC 11.1.  Creatinine 1.47.  COVID test negative.  X-ray of the right foot shows lytic destruction involving the fifth metatarsal cuboid and navicular bones consistent with osteomyelitis. IV vancomycin was started in the ED.  Hospitalist to admit for osteomyelitis.  Review of Systems: As per HPI all other systems reviewed and negative.  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   Arthritis    Chronic kidney disease    recent UTI   Coronary artery disease    Depression May 05, 2010   Following sudden death of husband   Diabetes mellitus, type 2 ([place])    Hypertension    Kidney cysts    Metabolic syndrome    Elevated triglycerides, low HDL, fasting hyperglycemia with low-dose sulfonylurea   Myocardial infarction ([place]) 09/2010   PONV (postoperative nausea and vomiting)    Tobacco abuse    60-90-pack-year history; 1/3 pack per day in May 05, 2011    Past Surgical History:  Procedure Laterality  Date   ABDOMINAL AORTAGRAM N/A 10/09/2013   Procedure: ABDOMINAL Maxcine Ham;  Surgeon: Angelia Mould, MD;  Location[facility] CATH LAB;  Service: Cardiovascular;  Laterality: N/A;   AMPUTATION Right 10/19/2013   Procedure: AMPUTATION DIGIT-RIGHT 4TH TOE AMPUTATION;  Surgeon: Angelia Mould, MD;  Location: [place];  Service: Vascular;  Laterality: Right;   APPLICATION OF WOUND VAC Right 11/24/2012   Procedure: APPLICATION OF WOUND VAC;  Surgeon: Marcheta Grammes, DPM;  Location: [facility];  Service: Orthopedics;  Laterality: Right;   BILATERAL OOPHORECTOMY  May 04, 2009   for benign mass   BREAST SURGERY Left    brest biopsy   CHOLECYSTECTOMY     CORONARY ANGIOPLASTY WITH STENT PLACEMENT  10/01/10   DES x2   FEMORAL-POPLITEAL BYPASS GRAFT Right 10/19/2013   Procedure: RIGHT FEMORAL-BELOW KNEE POPLITEAL ARTERY BYPASS GRAFT;  Surgeon: Angelia Mould, MD;  Location: [facility];  Service: Vascular;  Laterality: Right;   INCISION AND DRAINAGE OF WOUND Right 11/24/2012   Procedure: DEBRIDEMENT WOUND FOOT;  Surgeon: Marcheta Grammes, DPM;  Location: [facility];  Service: Orthopedics;  Laterality: Right;   INCISION AND DRAINAGE OF WOUND Right 03/23/2013   Procedure: DEBRIDEMENT OF INFECTED BONE 5TH METATARSAL RIGHT FOOT;  Surgeon: Marcheta Grammes, DPM;  Location: [facility];  Service: Orthopedics;  Laterality: Right;   INTRAOPERATIVE ARTERIOGRAM Right 10/19/2013   Procedure: INTRA OPERATIVE ARTERIOGRAM;  Surgeon: Angelia Mould, MD;  Location: [place];  Service:  Vascular;  Laterality: Right;   METATARSAL HEAD EXCISION Right 11/03/2012   Procedure: AMPUTATION OF RIGHT FIFTH TOE AND PORTION OF FIFTH METATARSAL;  Surgeon: Marcheta Grammes, DPM;  Location: [facility];  Service: Orthopedics;  Laterality: Right;   TOE AMPUTATION       reports that she has been smoking cigarettes. She started smoking about 37 years ago. She has a 30.00 pack-year smoking history. She has never used smokeless tobacco. She  reports that she does not drink alcohol and does not use drugs.  Allergies  Allergen Reactions   Bactrim Diarrhea   Isosorbide Mononitrate Other (See Comments)    Made patient feel as though she was about to have another MI   Keflex [Cephalexin]     Yeast infection    Family History  Problem Relation Age of Onset   Heart disease Father    Hyperlipidemia Father    Hypertension Father    Heart disease Mother    Diabetes Mother    Hyperlipidemia Mother    Hypertension Mother    Glaucoma Mother    Varicose Veins Sister    Cancer Brother    Cancer Maternal Grandfather    Diabetes Maternal Grandmother    Heart disease Maternal Grandmother    Hypertension Maternal Grandmother    COPD Paternal Grandfather    Hyperlipidemia Sister    Prior to Admission medications   Medication Sig Start Date End Date Taking? Authorizing Provider  aspirin EC 81 MG tablet Take 81 mg by mouth daily.    [provider]  atorvastatin (LIPITOR) 10 MG tablet Take 1 tablet (10 mg total) by mouth daily. 10/17/13   Herminio Commons, MD  cefpodoxime (VANTIN) 200 MG tablet Take 1 tablet (200 mg total) by mouth 2 (two) times daily. 02/06/21   Myna Bright M, PA-C  doxepin (SINEQUAN) 25 MG capsule Take 1 capsule (25 mg total) by mouth at bedtime. 08/23/18   Nat Christen, MD  FARXIGA 5 MG TABS tablet Take 5 mg by mouth daily. 10/08/18   [provider]  fenofibrate (TRICOR) 145 MG tablet  10/15/18   [provider]  fish oil-omega-3 fatty acids 1000 MG capsule Take 3 g by mouth daily.     [provider]  glipiZIDE (GLUCOTROL) 5 MG tablet Take 5 mg by mouth daily.    [provider]  lisinopril (ZESTRIL) 10 MG tablet  10/17/18   [provider]  lithium 300 MG tablet Take 2 tablets (600 mg total) by mouth daily with supper. 03/11/18   Addison Lank, PA-C  metFORMIN (GLUCOPHAGE) 500 MG tablet Take 500 mg by mouth 2 (two) times daily with a meal.    [provider]  metoprolol tartrate (LOPRESSOR) 25 MG tablet Take 1 tablet (25 mg total) by mouth 2 (two) times daily. 07/20/12   Yehuda Savannah, MD  niacin 500 MG CR capsule Take 1 capsule (500 mg total) by mouth at bedtime. 12/25/11   Imogene Burn, PA-C  nitroGLYCERIN (NITROSTAT) 0.4 MG SL tablet Place 1 tablet (0.4 mg total) under the tongue every 5 (five) minutes as needed. 07/20/12   Yehuda Savannah, MD  oxyCODONE (ROXICODONE) 5 MG immediate release tablet Take 1 tablet (5 mg total) by mouth every 6 (six) hours as needed for severe pain. 10/21/13   Rhyne, Hulen Shouts, PA-C  venlafaxine XR (EFFEXOR-XR) 150 MG 24 hr capsule Take 150 mg by mouth daily with breakfast. 02/28/18   [provider]  Physical Exam: Vitals:   03/10/21 1430 03/10/21 1530 03/10/21 1600 03/10/21 1630  BP: 112/84 (!) 102/45 (!) 117/52 (!) 90/48  Pulse: 68 69 62 68  Resp: (!) $RemoveB'25 19 16 13  'tsoOJqmQ$ Temp:      TempSrc:      SpO2: 91% 91% 92% 92%  Weight:      Height:        Constitutional: NAD, calm, comfortable Vitals:   03/10/21 1430 03/10/21 1530 03/10/21 1600 03/10/21 1630  BP: 112/84 (!) 102/45 (!) 117/52 (!) 90/48  Pulse: 68 69 62 68  Resp: (!) $RemoveB'25 19 16 13  'xmomGKHW$ Temp:      TempSrc:      SpO2: 91% 91% 92% 92%  Weight:      Height:       Eyes: PERRL, lids and conjunctivae normal ENMT: Mucous membranes are moist.   Neck: normal, supple, no masses, no thyromegaly Respiratory: clear to auscultation bilaterally, no wheezing, no crackles. Normal respiratory effort. No accessory muscle use.  Cardiovascular: Regular rate and rhythm, no murmurs / rubs / gallops. No extremity edema.  Lower extremities warm. Abdomen: no tenderness, no masses palpated. No hepatosplenomegaly. Bowel sounds positive.  Musculoskeletal: no clubbing / cyanosis. No joint deformity upper and lower extremities. Good ROM, no contractures. Normal muscle tone.  Skin: ~ 3cm ulcer to dorsal surface of right foot, no apparent drainage,  fluctuance not appreciated, no tenderness no erythema appreciated.  No rashes, lesions, ulcers. No induration Neurologic: No apparent cranial nerve abnormality, moving extremities spontaneously. Psychiatric: Normal judgment and insight. Alert and oriented x 3. Normal mood.        Labs on Admission: I have personally reviewed following labs and imaging studies  CBC: Recent Labs  Lab 03/10/21 1242  WBC 11.1*  NEUTROABS 8.5*  HGB 9.0*  HCT 30.4*  MCV 93.3  PLT 789*   Basic Metabolic Panel: Recent Labs  Lab 03/10/21 1242  NA 130*  K 4.6  CL 94*  CO2 23  GLUCOSE 155*  BUN 23*  CREATININE 1.47*  CALCIUM 9.0    Radiological Exams on Admission: DG Foot Complete Right  Result Date: 03/10/2021 CLINICAL DATA:  Diabetic foot wound without known injury. EXAM: RIGHT FOOT COMPLETE - 3+ VIEW COMPARISON:  March 23, 2013. FINDINGS: Status post amputation involving the fourth and fifth metatarsals and phalanges. Diffuse soft tissue swelling is noted concerning for cellulitis. Lytic destruction involving the proximal portion of the first metatarsal is noted with pathologic fracture. Lytic destruction is also seen involving the cuboid bone with pathologic fracture. Lytic destruction of the navicular bone is also noted. Dislocation of the second and third metatarsals is noted as well. IMPRESSION: Lytic destruction is seen involving the first metatarsal, cuboid and navicular bones consistent with osteomyelitis. Status post amputation of the fourth and fifth metatarsals and phalanges is noted. Electronically Signed   By: Marijo Conception M.D.   On: 03/10/2021 12:56    EKG: None.  Assessment/Plan Principal Problem:   Acute osteomyelitis of right foot (HCC) Active Problems:   Diabetic foot ulcer ([place])   Hypertension   Arteriosclerotic cardiovascular disease (ASCVD)   Diabetes mellitus, type 2 ([place])   Diabetic foot, acute osteomyelitis + chronic ulcer of the right foot-afebrile temperature  97.7.  WBC 11.1.  Stable vitals.  Rules out for sepsis.  Episode of initial vomiting earlier, nausea and dizziness, but that has since resolved.  She is otherwise well-appearing and has no other complaints.  Xray right foot suggest Osteomyelitis-lytic  destruction of fifth metatarsal cuboid and navicular bones.  Saw vascular surgeon Dr. Scot Dock in the past- 2015 -Admit to [facility], patient will need ID consult, ortho- Dr. Sharol Given versus vascular consult -If still here at [facility] in a.m. consider Ortho consult if deep tissue cultures can be obtained -N.p.o. midnight - IV vancomycin given in the ED, hold off on further antibiotics at this time -Obtain blood cultures -Obtain arterial brachial index -Obtain MRI Wo contrast of the right foot -ESR, CRP  Chronic Hyponatremia- 130. She tends to run low.  High 120s to low 130s.  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  About baseline.  History of peripheral vascular disease, amputation of fourth and fifth right phalanges and metatarsals- amputation by podiatrist Dr. Caprice Beaver.  Patient states she has not seen her podiatrist in at least 7 years.  Diabetes mellitus-random glucose 155. - SSI- S -Hold metformin and glipizide while inpatient - Hgba1c  Hypertension-stable. -Resume lisinopril, metoprolol  DVT prophylaxis: SCDS for now pending surgical evaluation Code Status:  Full code Family Communication: None at bedside. Disposition Plan:  > 2 days Consults called: Will need ID consult + vascular consult or Ortho consult. Admission status: Inpt, tele I certify that at the point of admission it is my clinical judgment that the patient will require inpatient hospital care spanning beyond 2 midnights from the point of admission due to high intensity of service, high risk for further deterioration and high frequency of surveillance required.    Bethena Roys MD [facility]  03/10/2021, 11:41 PM

## 2021-03-10 NOTE — ED Provider Notes (Signed)
[facility] EMERGENCY DEPARTMENT Provider Note   CSN: [id number] Arrival date & time: 03/10/21  1039     History  Chief Complaint  Patient presents with   Nausea    Christy [name] is a 61 y.o. female.  Pt complains of nausea and infected wound to right foot.  Pt reports today she feel weak and nauseated.  Pt is going to wound care center. Pt reports she has not seen podiatrist recently. Pt concerned infection is worse.   The history is provided by the patient. No language interpreter was used.  Foot Pain This is a new problem. The current episode started more than 1 week ago. The problem occurs constantly. The problem has been gradually worsening. Nothing aggravates the symptoms. Nothing relieves the symptoms. She has tried nothing for the symptoms. The treatment provided no relief.      Home Medications Prior to Admission medications   Medication Sig Start Date End Date Taking? Authorizing Provider  aspirin EC 81 MG tablet Take 81 mg by mouth daily.    [provider]  atorvastatin (LIPITOR) 10 MG tablet Take 1 tablet (10 mg total) by mouth daily. 10/17/13   Herminio Commons, MD  cefpodoxime (VANTIN) 200 MG tablet Take 1 tablet (200 mg total) by mouth 2 (two) times daily. 02/06/21   Myna Bright M, PA-C  doxepin (SINEQUAN) 25 MG capsule Take 1 capsule (25 mg total) by mouth at bedtime. 08/23/18   Nat Christen, MD  FARXIGA 5 MG TABS tablet Take 5 mg by mouth daily. 10/08/18   [provider]  fenofibrate (TRICOR) 145 MG tablet  10/15/18   [provider]  fish oil-omega-3 fatty acids 1000 MG capsule Take 3 g by mouth daily.     [provider]  glipiZIDE (GLUCOTROL) 5 MG tablet Take 5 mg by mouth daily.    [provider]  lisinopril (ZESTRIL) 10 MG tablet  10/17/18   [provider]  lithium 300 MG tablet Take 2 tablets (600 mg total) by mouth daily with supper. 03/11/18   Addison Lank, PA-C  metFORMIN (GLUCOPHAGE) 500 MG  tablet Take 500 mg by mouth 2 (two) times daily with a meal.    [provider]  metoprolol tartrate (LOPRESSOR) 25 MG tablet Take 1 tablet (25 mg total) by mouth 2 (two) times daily. 07/20/12   Yehuda Savannah, MD  niacin 500 MG CR capsule Take 1 capsule (500 mg total) by mouth at bedtime. 12/25/11   Imogene Burn, PA-C  nitroGLYCERIN (NITROSTAT) 0.4 MG SL tablet Place 1 tablet (0.4 mg total) under the tongue every 5 (five) minutes as needed. 07/20/12   Yehuda Savannah, MD  oxyCODONE (ROXICODONE) 5 MG immediate release tablet Take 1 tablet (5 mg total) by mouth every 6 (six) hours as needed for severe pain. 10/21/13   Rhyne, Hulen Shouts, PA-C  venlafaxine XR (EFFEXOR-XR) 150 MG 24 hr capsule Take 150 mg by mouth daily with breakfast. 02/28/18   [provider]      Allergies    Bactrim, Isosorbide mononitrate, and Keflex [cephalexin]    Review of Systems   Review of Systems  Constitutional:  Positive for fever.  All other systems reviewed and are negative.  Physical Exam Updated Vital Signs BP (!) 102/45    Pulse 69    Temp 97.7 F (36.5 C) (Oral)    Resp 19    Ht 5' 5.4" (1.661 m)    Wt 96.6 kg  SpO2 91%    BMI 35.01 kg/m  Physical Exam Vitals and nursing note reviewed.  Constitutional:      Appearance: She is well-developed.  HENT:     Head: Normocephalic.  Cardiovascular:     Rate and Rhythm: Normal rate.  Pulmonary:     Effort: Pulmonary effort is normal.  Abdominal:     General: Abdomen is flat. There is no distension.  Musculoskeletal:        General: Swelling and deformity present.     Cervical back: Normal range of motion.     Comments: Oozing ulcer base of right foot , decreased sensation  nv and ns intact  Skin:    General: Skin is warm.  Neurological:     General: No focal deficit present.     Mental Status: She is alert and oriented to person, place, and time.  Psychiatric:        Mood and Affect: Mood normal.    ED Results / Procedures /  Treatments   Labs (all labs ordered are listed, but only abnormal results are displayed) Labs Reviewed  CBC WITH DIFFERENTIAL/PLATELET - Abnormal; Notable for the following components:      Result Value   WBC 11.1 (*)    RBC 3.26 (*)    Hemoglobin 9.0 (*)    HCT 30.4 (*)    MCHC 29.6 (*)    RDW 15.6 (*)    Platelets 516 (*)    Neutro Abs 8.5 (*)    Abs Immature Granulocytes 0.50 (*)    All other components within normal limits  BASIC METABOLIC PANEL - Abnormal; Notable for the following components:   Sodium 130 (*)    Chloride 94 (*)    Glucose, Bld 155 (*)    BUN 23 (*)    Creatinine, Ser 1.47 (*)    GFR, Estimated 41 (*)    All other components within normal limits  RESP PANEL BY RT-PCR (FLU A&B, COVID) ARPGX2  URINALYSIS, ROUTINE W REFLEX MICROSCOPIC    EKG None  Radiology DG Foot Complete Right  Result Date: 03/10/2021 CLINICAL DATA:  Diabetic foot wound without known injury. EXAM: RIGHT FOOT COMPLETE - 3+ VIEW COMPARISON:  March 23, 2013. FINDINGS: Status post amputation involving the fourth and fifth metatarsals and phalanges. Diffuse soft tissue swelling is noted concerning for cellulitis. Lytic destruction involving the proximal portion of the first metatarsal is noted with pathologic fracture. Lytic destruction is also seen involving the cuboid bone with pathologic fracture. Lytic destruction of the navicular bone is also noted. Dislocation of the second and third metatarsals is noted as well. IMPRESSION: Lytic destruction is seen involving the first metatarsal, cuboid and navicular bones consistent with osteomyelitis. Status post amputation of the fourth and fifth metatarsals and phalanges is noted. Electronically Signed   By: Marijo Conception M.D.   On: 03/10/2021 12:56    Procedures Procedures    Medications Ordered in ED Medications  ondansetron (ZOFRAN-ODT) disintegrating tablet 4 mg (4 mg Oral Given 03/10/21 1250)    ED Course/ Medical Decision Making/  A&P                           Medical Decision Making Problems Addressed: Osteomyelitis of right foot, unspecified type [facility]): acute illness or injury    Details: Pt has a chronic diabetic wound on her right foot.  Amount and/or Complexity of Data Reviewed Independent Historian: spouse    Details:  husband reports wound is not improving External Data Reviewed: notes.    Details: Wound care notes reviewed Labs: ordered. Decision-making details documented in ED Course.    Details: Pt has elevated wbc's to 11.1 Pt anemic with hemoglobin of 9.0 Radiology: ordered and independent interpretation performed. Decision-making details documented in ED Course.    Details: Xray  lytic destruction 1st metatarsal, cuboid and navicular bones  Risk Prescription drug management. Decision regarding hospitalization.           Final Clinical Impression(s) / ED Diagnoses Final diagnoses:  Osteomyelitis of right foot, unspecified type [facility])    Rx / [place] Orders ED Discharge Orders     None         Sidney Ace 03/10/21 1641    Milton Ferguson, MD 03/12/21 [id number]

## 2021-03-10 NOTE — Therapy (Signed)
[facility] [address] [place], [place], [address] Phone: [phone]   Fax:  [phone]  Patient Details  Name: Christy [name] MRN: [id number] Date of Birth: 20-May-1960 Referring Provider:  Dion Saucier, PA-C  Encounter Date: 03/10/2021  Therapist went to waiting room to call pt back when pt requested therapist to call the EMS.  Therapist inquired what was wrong, pt stated that she was weak, nauseated and dizzy.  Therapist had front staff call EMS at which time pt vomited bright green emesis.  EMS arrived and took pt to the ER.  No treatment was rendered.   Virgina Organ, PT CLT [phone]  03/10/2021, 10:35 AM   [facility] [address] [place], [place], [address] Phone: [phone]   Fax:  [phone]

## 2021-03-11 ENCOUNTER — Inpatient Hospital Stay (HOSPITAL_COMMUNITY): Payer: 59

## 2021-03-11 DIAGNOSIS — M86171 Other acute osteomyelitis, right ankle and foot: Secondary | ICD-10-CM | POA: Diagnosis not present

## 2021-03-11 LAB — CBG MONITORING, ED
Glucose-Capillary: 103 mg/dL — ABNORMAL HIGH (ref 70–99)
Glucose-Capillary: 123 mg/dL — ABNORMAL HIGH (ref 70–99)
Glucose-Capillary: 172 mg/dL — ABNORMAL HIGH (ref 70–99)
Glucose-Capillary: 171 mg/dL — ABNORMAL HIGH (ref 70–99)

## 2021-03-11 LAB — C-REACTIVE PROTEIN: CRP: 17.1 mg/dL — ABNORMAL HIGH (ref <1.0)

## 2021-03-11 LAB — CBC
HCT: 28.5 % — ABNORMAL LOW (ref 36.0–46.0)
Hemoglobin: 8.9 g/dL — ABNORMAL LOW (ref 12.0–15.0)
MCH: 29 pg (ref 26.0–34.0)
MCHC: 31.2 g/dL (ref 30.0–36.0)
MCV: 92.8 fL (ref 80.0–100.0)
Platelets: 514 10*3/uL — ABNORMAL HIGH (ref 150–400)
RBC: 3.07 MIL/uL — ABNORMAL LOW (ref 3.87–5.11)
RDW: 15.8 % — ABNORMAL HIGH (ref 11.5–15.5)
WBC: 8.4 10*3/uL (ref 4.0–10.5)
nRBC: 0 % (ref 0.0–0.2)

## 2021-03-11 LAB — COMPREHENSIVE METABOLIC PANEL
ALT: 53 U/L — ABNORMAL HIGH (ref 0–44)
AST: 61 U/L — ABNORMAL HIGH (ref 15–41)
Albumin: 2.2 g/dL — ABNORMAL LOW (ref 3.5–5.0)
Alkaline Phosphatase: 127 U/L — ABNORMAL HIGH (ref 38–126)
Anion gap: 7 (ref 5–15)
BUN: 28 mg/dL — ABNORMAL HIGH (ref 6–20)
CO2: 26 mmol/L (ref 22–32)
Calcium: 8.8 mg/dL — ABNORMAL LOW (ref 8.9–10.3)
Chloride: 96 mmol/L — ABNORMAL LOW (ref 98–111)
Creatinine, Ser: 1.42 mg/dL — ABNORMAL HIGH (ref 0.44–1.00)
GFR, Estimated: 42 mL/min — ABNORMAL LOW (ref >60)
Glucose, Bld: 159 mg/dL — ABNORMAL HIGH (ref 70–99)
Potassium: 4.7 mmol/L (ref 3.5–5.1)
Sodium: 129 mmol/L — ABNORMAL LOW (ref 135–145)
Total Bilirubin: 0.3 mg/dL (ref 0.3–1.2)
Total Protein: 7.8 g/dL (ref 6.5–8.1)

## 2021-03-11 LAB — SEDIMENTATION RATE: Sed Rate: 40 mm/hr — ABNORMAL HIGH (ref 0–22)

## 2021-03-11 LAB — HEMOGLOBIN A1C
Hgb A1c MFr Bld: 7.5 % — ABNORMAL HIGH (ref 4.8–5.6)
Mean Plasma Glucose: 169 mg/dL

## 2021-03-11 LAB — GLUCOSE, CAPILLARY: Glucose-Capillary: 137 mg/dL — ABNORMAL HIGH (ref 70–99)

## 2021-03-11 LAB — HIV ANTIBODY (ROUTINE TESTING W REFLEX): HIV Screen 4th Generation wRfx: NONREACTIVE

## 2021-03-11 MED ORDER — VANCOMYCIN HCL 2000 MG/400ML IV SOLN
2000.0000 mg | Freq: Once | INTRAVENOUS | Status: AC
  Administered 2021-03-11: 16:00:00 2000 mg via INTRAVENOUS
  Filled 2021-03-11: qty 400

## 2021-03-11 MED ORDER — VANCOMYCIN HCL 1500 MG/300ML IV SOLN
1500.0000 mg | INTRAVENOUS | Status: DC
  Administered 2021-03-12 – 2021-03-14 (×4): 1500 mg via INTRAVENOUS
  Filled 2021-03-11 (×4): qty 300

## 2021-03-11 NOTE — Progress Notes (Signed)
PROGRESS NOTE    Christy [name]  [id number] DOB: 1960-08-20 DOA: 03/10/2021 PCP: Wanita Chamberlain, PA-C   Brief Narrative:  This 61 years old female with PMH significant for peripheral vascular disease with amputation of right fourth and fifth metatarsal plus phalanges few years back, diabetes mellitus, hypertension, CKD stage IIIa presented to the ED with complaints of ulcer on the right foot and associated swelling.  She has been following up with wound care for this chronic ongoing ulcer.  She reports having associated pain when she bears weight. X-ray of the right foot shows lytic destruction involving fifth metatarsal cuboid and navicular bone consistent with osteomyelitis.  MRI shows advanced neuropathic foot, finding consistent with osteomyelitis.Multiple complex fluid collections surrounding the midfoot with complex fluid in the extensor hallucis longus tendon sheath, suspicious for infectious tenosynovitis. Patient is admitted for osteomyelitis and started on IV antibiotics,  vancomycin.  Patient is awaiting transfer to [facility] for osteomyelitis.  Patient needs vascular and podiatry consult.  Assessment & Plan:   Principal Problem:   Acute osteomyelitis of right foot ([place]) Active Problems:   Hypertension   Arteriosclerotic cardiovascular disease (ASCVD)   Diabetes mellitus, type 2 (HCC)   Diabetic foot ulcer ([place])  Charcot's foot / Acute osteomyelitis: Chronic ulcer of right foot: Patient presented with chronic ulcer of the right foot. Remains afebrile, stable vitals.  Sepsis ruled out. X-ray right foot suggest osteomyelitis, lytic destruction of fifth metatarsal , cuboid and navicular bones. MRI consistent with osteomyelitis, infectious tenosynovitis. Patient has seen vascular surgeon Dr. Doren Custard in 2015. Admitted to [facility]. Patient needs ID, Ortho, and vascular surgery consult. Discussed with orthopedics at [facility], advised to continue IV antibiotics. Follow-up  Dr. Sharol Given at [facility]. Continue IV antibiotics vancomycin. Follow-up blood cultures. Adequate pain control.  Chronic hyponatremia: Baseline sodium between 125-130. Continue IV gentle hydration,  Continue to monitor sodium.  CKD stage IIIb: Serum creatinine at baseline.  History of peripheral vascular disease: Patient has amputation by podiatrist Dr. Caprice Beaver. Patient has not followed up with podiatrist in 7 years.  Type 2 diabetes: Hold metformin and glipizide. Obtain hemoglobin A1c Regular Sliding scale.  Essential hypertension: Continue lisinopril and metoprolol.  DVT prophylaxis: SCDs Code Status: Full code. Family Communication:  No family at bed side. Disposition Plan:   Status is: Inpatient  Remains inpatient appropriate because:  Admitted for possible osteomyelitis requiring IV antibiotics.  Patient also needs vascular and podiatry evaluation.  Consultants:  Needs general surgery, vascular surgery evaluation.  Procedures: MRI Right foot Antimicrobials:   Anti-infectives (From admission, onward)    Start     Dose/Rate Route Frequency Ordered Stop   03/10/21 1615  vancomycin (VANCOCIN) IVPB 1000 mg/200 mL premix        1,000 mg 200 mL/hr over 60 Minutes Intravenous  Once 03/10/21 1609 03/10/21 2201       Subjective: Patient was seen and examined at bedside.  Overnight events noted.   Patient reports feeling better.  She denies any pain in the right foot.  Objective: Vitals:   03/11/21 1230 03/11/21 1300 03/11/21 1330 03/11/21 1345  BP: (!) 103/57 (!) 121/50  134/64  Pulse: 67 68 63 (!) 58  Resp: 17 18 19 19   Temp:      TempSrc:      SpO2: 97% 97% 92% 95%  Weight:      Height:        Intake/Output Summary (Last 24 hours) at 03/11/2021 1417 Last data filed at  03/10/2021 2201 Gross per 24 hour  Intake 199.48 ml  Output --  Net 199.48 ml   Filed Weights   03/10/21 1045  Weight: 96.6 kg    Examination:  General exam: Appears comfortable,  not in any acute distress.  Deconditioned Respiratory system: Clear to auscultation bilaterally, respiratory effort normal, RR 15. Cardiovascular system: S1-S2 heard, regular rate and rhythm, no murmur. Gastrointestinal system: Abdomen is soft, nontender, nondistended, BS+ Central nervous system: Alert and oriented x 2 . No focal neurological deficits. Extremities: 3 cm ulcer to the dorsal surface of right foot,  no drainage, no fluctuance,  no erythema, no tenderness. Skin: No rashes, lesions or ulcers Psychiatry: Judgement and insight appear normal. Mood & affect appropriate.     Data Reviewed: I have personally reviewed following labs and imaging studies  CBC: Recent Labs  Lab 03/10/21 1242 03/11/21 0010  WBC 11.1* 8.4  NEUTROABS 8.5*  --   HGB 9.0* 8.9*  HCT 30.4* 28.5*  MCV 93.3 92.8  PLT 516* 0000000*   Basic Metabolic Panel: Recent Labs  Lab 03/10/21 1242 03/11/21 0010  NA 130* 129*  K 4.6 4.7  CL 94* 96*  CO2 23 26  GLUCOSE 155* 159*  BUN 23* 28*  CREATININE 1.47* 1.42*  CALCIUM 9.0 8.8*   GFR: Estimated Creatinine Clearance: 48.8 mL/min (A) (by C-G formula based on SCr of 1.42 mg/dL (H)). Liver Function Tests: Recent Labs  Lab 03/11/21 0010  AST 61*  ALT 53*  ALKPHOS 127*  BILITOT 0.3  PROT 7.8  ALBUMIN 2.2*   No results for input(s): LIPASE, AMYLASE in the last 168 hours. No results for input(s): AMMONIA in the last 168 hours. Coagulation Profile: No results for input(s): INR, PROTIME in the last 168 hours. Cardiac Enzymes: No results for input(s): CKTOTAL, CKMB, CKMBINDEX, TROPONINI in the last 168 hours. BNP (last 3 results) No results for input(s): PROBNP in the last 8760 hours. HbA1C: No results for input(s): HGBA1C in the last 72 hours. CBG: Recent Labs  Lab 03/10/21 2320 03/11/21 0414 03/11/21 0848 03/11/21 1332  GLUCAP 169* 103* 123* 172*   Lipid Profile: No results for input(s): CHOL, HDL, LDLCALC, TRIG, CHOLHDL, LDLDIRECT in the  last 72 hours. Thyroid Function Tests: No results for input(s): TSH, T4TOTAL, FREET4, T3FREE, THYROIDAB in the last 72 hours. Anemia Panel: No results for input(s): VITAMINB12, FOLATE, FERRITIN, TIBC, IRON, RETICCTPCT in the last 72 hours. Sepsis Labs: No results for input(s): PROCALCITON, LATICACIDVEN in the last 168 hours.  Recent Results (from the past 240 hour(s))  Resp Panel by RT-PCR (Flu A&B, Covid) Nasopharyngeal Swab     Status: None   Collection Time: 03/10/21  2:57 PM   Specimen: Nasopharyngeal Swab; Nasopharyngeal(NP) swabs in vial transport medium  Result Value Ref Range Status   SARS Coronavirus 2 by RT PCR NEGATIVE NEGATIVE Final    Comment: (NOTE) SARS-CoV-2 target nucleic acids are NOT DETECTED.  The SARS-CoV-2 RNA is generally detectable in upper respiratory specimens during the acute phase of infection. The lowest concentration of SARS-CoV-2 viral copies this assay can detect is 138 copies/mL. A negative result does not preclude SARS-Cov-2 infection and should not be used as the sole basis for treatment or other patient management decisions. A negative result may occur with  improper specimen collection/handling, submission of specimen other than nasopharyngeal swab, presence of viral mutation(s) within the areas targeted by this assay, and inadequate number of viral copies(<138 copies/mL). A negative result must be combined with clinical observations,  patient history, and epidemiological information. The expected result is Negative.  Fact Sheet for Patients:  EntrepreneurPulse.com.au  Fact Sheet for Healthcare Providers:  IncredibleEmployment.be  This test is no t yet approved or cleared by the [place] FDA and  has been authorized for detection and/or diagnosis of SARS-CoV-2 by FDA under an Emergency Use Authorization (EUA). This EUA will remain  in effect (meaning this test can be used) for the duration of  the COVID-19 declaration under Section 564(b)(1) of the Act, 21 U.S.C.section 360bbb-3(b)(1), unless the authorization is terminated  or revoked sooner.       Influenza A by PCR NEGATIVE NEGATIVE Final   Influenza B by PCR NEGATIVE NEGATIVE Final    Comment: (NOTE) The Xpert Xpress SARS-CoV-2/FLU/RSV plus assay is intended as an aid in the diagnosis of influenza from Nasopharyngeal swab specimens and should not be used as a sole basis for treatment. Nasal washings and aspirates are unacceptable for Xpert Xpress SARS-CoV-2/FLU/RSV testing.  Fact Sheet for Patients: EntrepreneurPulse.com.au  Fact Sheet for Healthcare Providers: IncredibleEmployment.be  This test is not yet approved or cleared by the [place] FDA and has been authorized for detection and/or diagnosis of SARS-CoV-2 by FDA under an Emergency Use Authorization (EUA). This EUA will remain in effect (meaning this test can be used) for the duration of the COVID-19 declaration under Section 564(b)(1) of the Act, 21 U.S.C. section 360bbb-3(b)(1), unless the authorization is terminated or revoked.  Performed at [facility], [address], [place], [place] [address]   Culture, blood (routine x 2)     Status: None (Preliminary result)   Collection Time: 03/11/21 12:10 AM   Specimen: Right Antecubital; Blood  Result Value Ref Range Status   Specimen Description RIGHT ANTECUBITAL  Final   Special Requests   Final    BOTTLES DRAWN AEROBIC AND ANAEROBIC Blood Culture adequate volume   Culture   Final    NO GROWTH < 12 HOURS Performed at [facility], [address] [place], [place] [address]    Report Status PENDING  Incomplete  Culture, blood (routine x 2)     Status: None (Preliminary result)   Collection Time: 03/11/21 12:10 AM   Specimen: BLOOD RIGHT FOREARM  Result Value Ref Range Status   Specimen Description BLOOD RIGHT FOREARM  Final   Special Requests   Final     BOTTLES DRAWN AEROBIC AND ANAEROBIC Blood Culture results may not be optimal due to an excessive volume of blood received in culture bottles   Culture   Final    NO GROWTH < 12 HOURS Performed at [facility], [address], [place], [place] [address]    Report Status PENDING  Incomplete    Radiology Studies: MR FOOT RIGHT WO CONTRAST  Result Date: 03/11/2021 CLINICAL DATA:  Diabetic with foot swelling. Osteomyelitis suspected. Previous amputations to 4th and 5th metatarsals. Open wounds. EXAM: MRI OF THE RIGHT FOREFOOT WITHOUT CONTRAST TECHNIQUE: Multiplanar, multisequence MR imaging of the right forefoot was performed. No intravenous contrast was administered. COMPARISON:  Right foot radiographs 03/10/2021 and 03/23/2013. FINDINGS: Bones/Joint/Cartilage Study includes the forefoot and midfoot. The hindfoot and ankle are not included. In correlation with prior radiographs, patient is status post remote amputation through the base of the 5th metatarsal and more recent amputation through the mid 4th metatarsal. There is gross fragmentation, destruction and marrow T2 hyperintensity throughout the cuneiform bones, cuboid, navicular and bases of all the metatarsals which are posteriorly dislocated, findings most consistent with advanced neuropathic  joint (Charcot) changes. 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 The marrow edema within the 3rd metatarsal extends into the distal shaft. The remaining toes are intact. There is nonspecific low-level marrow T2 hyperintensity within the visualized talar head and medial malleolus. Ligaments Fracture dislocation at the Lisfranc joint. The collateral ligaments of the remaining metatarsophalangeal joints appear grossly intact. Muscles and Tendons There is a large amount of complex fluid within the extensor hallucis longus tendon, suspicious for infectious tenosynovitis. No  gross flexor tendon abnormality identified. Nonspecific T2 hyperintensity throughout the forefoot musculature. Soft tissues There is apparent soft tissue ulceration along the plantar aspect of the remaining 4th metatarsal, and there is a complex fluid collection surrounding the 4th metatarsal, measuring up to 3.3 x 2.0 cm transverse on image 32/11. Multiple other complex fluid collections are present within the midfoot, most notable medially. No other discrete areas of skin ulceration are identified. IMPRESSION: 1. 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 2. In the setting of advanced neuropathic changes, assessment for osteomyelitis is limited. However, given apparent skin ulceration along the plantar aspect of the remaining distal 4th metatarsal and adjacent complex fluid, the findings are suspicious for superimposed osteomyelitis. Of note, the proximal extent is difficult to assess on this examination of the forefoot. If additional amputation is planned, consider additional examination of the hindfoot/ankle. 3. Multiple complex fluid collections surrounding the midfoot with complex fluid in the extensor hallucis longus tendon sheath, suspicious for infectious tenosynovitis. Electronically Signed   By: Richardean Sale M.D.   On: 03/11/2021 08:52   DG Foot Complete Right  Result Date: 03/10/2021 CLINICAL DATA:  Diabetic foot wound without known injury. EXAM: RIGHT FOOT COMPLETE - 3+ VIEW COMPARISON:  March 23, 2013. FINDINGS: Status post amputation involving the fourth and fifth metatarsals and phalanges. Diffuse soft tissue swelling is noted concerning for cellulitis. Lytic destruction involving the proximal portion of the first metatarsal is noted with pathologic fracture. Lytic destruction is also seen involving the cuboid bone with pathologic fracture. Lytic destruction of the navicular bone is also noted. Dislocation of the second and third metatarsals is noted as  well. IMPRESSION: Lytic destruction is seen involving the first metatarsal, cuboid and navicular bones consistent with osteomyelitis. Status post amputation of the fourth and fifth metatarsals and phalanges is noted. Electronically Signed   By: Marijo Conception M.D.   On: 03/10/2021 12:56     Scheduled Meds:  aspirin EC  81 mg Oral Daily   atorvastatin  20 mg Oral QPM   citalopram  20 mg Oral Daily   insulin aspart  0-9 Units Subcutaneous Q4H   lisinopril  10 mg Oral Daily   metoprolol tartrate  25 mg Oral BID   Continuous Infusions:   LOS: 1 day    Time spent: 50 mins    Lannie Heaps, MD [facility]   If 7PM-7AM, please contact night-coverage

## 2021-03-11 NOTE — Progress Notes (Signed)
Pharmacy Antibiotic Note  Christy [name] is a 61 y.o. female admitted on 03/10/2021 with  osteomyelitis .  Pharmacy has been consulted for Vancomycin dosing.  MRI of right foots shows advanced neuropathic foot, finding consistent with osteomyelitis. Plan: Vancomycin 2000mg  IV loading dose, then 1500 mg IV Q 24 hrs. Goal AUC 400-550. Expected AUC: 532 SCr used: 1.4  F/U cxs and clinical progress Monitor V/S, labs and levels as indicated  Height: 5' 5.4" (166.1 cm) Weight: 96.6 kg (213 lb) IBW/kg (Calculated) : 57.92  No data recorded.  Recent Labs  Lab 03/10/21 1242 03/11/21 0010  WBC 11.1* 8.4  CREATININE 1.47* 1.42*    Estimated Creatinine Clearance: 48.8 mL/min (A) (by C-G formula based on SCr of 1.42 mg/dL (H)).    Allergies  Allergen Reactions   Bactrim Diarrhea   Isosorbide Mononitrate Other (See Comments)    Made patient feel as though she was about to have another MI   Keflex [Cephalexin]     Yeast infection    Antimicrobials this admission: Vancomycin  1/24 >>   Microbiology results: 1/24 BCx: pending  MRSA PCR:   Thank you for allowing pharmacy to be a part of this patients care.  2/24, BS Pharm D, [facility] Clinical Pharmacist Pager [phone] 03/11/2021 2:42 PM

## 2021-03-11 NOTE — ED Notes (Signed)
Cleaned with soap and water on right foot, betadine area of wound. Placed a non-stick gauze on wound and wrapped with gauze.

## 2021-03-11 NOTE — ED Notes (Signed)
Unable to complete CBG, patient is not in room.

## 2021-03-11 NOTE — ED Notes (Signed)
Pt not in room for accucheck

## 2021-03-12 ENCOUNTER — Inpatient Hospital Stay (HOSPITAL_COMMUNITY): Payer: 59

## 2021-03-12 ENCOUNTER — Ambulatory Visit (HOSPITAL_COMMUNITY): Payer: Self-pay | Admitting: Physical Therapy

## 2021-03-12 DIAGNOSIS — Z95828 Presence of other vascular implants and grafts: Secondary | ICD-10-CM

## 2021-03-12 DIAGNOSIS — M869 Osteomyelitis, unspecified: Secondary | ICD-10-CM

## 2021-03-12 DIAGNOSIS — L97509 Non-pressure chronic ulcer of other part of unspecified foot with unspecified severity: Secondary | ICD-10-CM

## 2021-03-12 DIAGNOSIS — E1169 Type 2 diabetes mellitus with other specified complication: Principal | ICD-10-CM

## 2021-03-12 DIAGNOSIS — E1152 Type 2 diabetes mellitus with diabetic peripheral angiopathy with gangrene: Secondary | ICD-10-CM

## 2021-03-12 DIAGNOSIS — E11621 Type 2 diabetes mellitus with foot ulcer: Secondary | ICD-10-CM | POA: Diagnosis not present

## 2021-03-12 DIAGNOSIS — Z7982 Long term (current) use of aspirin: Secondary | ICD-10-CM

## 2021-03-12 DIAGNOSIS — Z79899 Other long term (current) drug therapy: Secondary | ICD-10-CM

## 2021-03-12 DIAGNOSIS — I739 Peripheral vascular disease, unspecified: Secondary | ICD-10-CM | POA: Diagnosis not present

## 2021-03-12 DIAGNOSIS — M86171 Other acute osteomyelitis, right ankle and foot: Secondary | ICD-10-CM | POA: Diagnosis not present

## 2021-03-12 DIAGNOSIS — Z7984 Long term (current) use of oral hypoglycemic drugs: Secondary | ICD-10-CM

## 2021-03-12 LAB — BASIC METABOLIC PANEL
Anion gap: 9 (ref 5–15)
BUN: 21 mg/dL — ABNORMAL HIGH (ref 6–20)
CO2: 25 mmol/L (ref 22–32)
Calcium: 9.1 mg/dL (ref 8.9–10.3)
Chloride: 94 mmol/L — ABNORMAL LOW (ref 98–111)
Creatinine, Ser: 1.26 mg/dL — ABNORMAL HIGH (ref 0.44–1.00)
GFR, Estimated: 49 mL/min — ABNORMAL LOW (ref >60)
Glucose, Bld: 208 mg/dL — ABNORMAL HIGH (ref 70–99)
Potassium: 5.2 mmol/L — ABNORMAL HIGH (ref 3.5–5.1)
Sodium: 128 mmol/L — ABNORMAL LOW (ref 135–145)

## 2021-03-12 LAB — IRON AND TIBC
Iron: 28 ug/dL (ref 28–170)
Saturation Ratios: 9 % — ABNORMAL LOW (ref 10.4–31.8)
TIBC: 301 ug/dL (ref 250–450)
UIBC: 273 ug/dL

## 2021-03-12 LAB — CBC WITH DIFFERENTIAL/PLATELET
Abs Immature Granulocytes: 0.3 10*3/uL — ABNORMAL HIGH (ref 0.00–0.07)
Basophils Absolute: 0.1 10*3/uL (ref 0.0–0.1)
Basophils Relative: 1 %
Eosinophils Absolute: 0.1 10*3/uL (ref 0.0–0.5)
Eosinophils Relative: 1 %
HCT: 27.7 % — ABNORMAL LOW (ref 36.0–46.0)
Hemoglobin: 8.8 g/dL — ABNORMAL LOW (ref 12.0–15.0)
Immature Granulocytes: 4 %
Lymphocytes Relative: 23 %
Lymphs Abs: 1.7 10*3/uL (ref 0.7–4.0)
MCH: 28.6 pg (ref 26.0–34.0)
MCHC: 31.8 g/dL (ref 30.0–36.0)
MCV: 89.9 fL (ref 80.0–100.0)
Monocytes Absolute: 0.5 10*3/uL (ref 0.1–1.0)
Monocytes Relative: 7 %
Neutro Abs: 4.9 10*3/uL (ref 1.7–7.7)
Neutrophils Relative %: 64 %
Platelets: 473 10*3/uL — ABNORMAL HIGH (ref 150–400)
RBC: 3.08 MIL/uL — ABNORMAL LOW (ref 3.87–5.11)
RDW: 15.7 % — ABNORMAL HIGH (ref 11.5–15.5)
WBC: 7.5 10*3/uL (ref 4.0–10.5)
nRBC: 0 % (ref 0.0–0.2)

## 2021-03-12 LAB — GLUCOSE, CAPILLARY
Glucose-Capillary: 150 mg/dL — ABNORMAL HIGH (ref 70–99)
Glucose-Capillary: 156 mg/dL — ABNORMAL HIGH (ref 70–99)
Glucose-Capillary: 151 mg/dL — ABNORMAL HIGH (ref 70–99)
Glucose-Capillary: 154 mg/dL — ABNORMAL HIGH (ref 70–99)

## 2021-03-12 LAB — FERRITIN: Ferritin: 158 ng/mL (ref 11–307)

## 2021-03-12 LAB — MRSA NEXT GEN BY PCR, NASAL: MRSA by PCR Next Gen: NOT DETECTED

## 2021-03-12 MED ORDER — INSULIN ASPART 100 UNIT/ML IJ SOLN
0.0000 [IU] | Freq: Every day | INTRAMUSCULAR | Status: DC
  Administered 2021-03-15: 5 [IU] via SUBCUTANEOUS

## 2021-03-12 MED ORDER — SODIUM CHLORIDE 0.9 % IV SOLN
2.0000 g | Freq: Two times a day (BID) | INTRAVENOUS | Status: DC
  Administered 2021-03-12 – 2021-03-15 (×6): 2 g via INTRAVENOUS
  Filled 2021-03-12 (×7): qty 2

## 2021-03-12 MED ORDER — INSULIN ASPART 100 UNIT/ML IJ SOLN
0.0000 [IU] | Freq: Three times a day (TID) | INTRAMUSCULAR | Status: DC
  Administered 2021-03-12: 10:00:00 1 [IU] via SUBCUTANEOUS
  Administered 2021-03-12 – 2021-03-13 (×5): 2 [IU] via SUBCUTANEOUS
  Administered 2021-03-14: 3 [IU] via SUBCUTANEOUS
  Administered 2021-03-14: 1 [IU] via SUBCUTANEOUS
  Administered 2021-03-15: 3 [IU] via SUBCUTANEOUS
  Administered 2021-03-15 (×2): 2 [IU] via SUBCUTANEOUS
  Administered 2021-03-16: 1 [IU] via SUBCUTANEOUS

## 2021-03-12 MED ORDER — SODIUM ZIRCONIUM CYCLOSILICATE 5 G PO PACK
5.0000 g | PACK | Freq: Once | ORAL | Status: AC
  Administered 2021-03-12: 15:00:00 5 g via ORAL
  Filled 2021-03-12: qty 1

## 2021-03-12 NOTE — Consult Note (Signed)
[facility]    Date of Admission:  03/10/2021     Total days of antibiotics 2   Vancomycin   Cefepime                Reason for Consult: Osteomyelitis Rt foot     Referring Provider: Renford DillsAdhikari Primary Care Provider: Dion SaucierBoles, Alyssa H, PA-C    Assessment: Christy [name] is a 61 y.o. female admitted for management of a chronic right plantar ulceration in the setting of Charcot arthopathy. MRI results reviewed and noted to have concern over bone destruction involving the cuneiform, cuboid, navicular and all metatarsal bases; typically difficult to differentiate infection vs structural/arthopathy changes however given she also has several large complex fluid collections described in the setting of a chronic draining wound likely predominately related to infection. Noted recommendation for BKA which would offer definitive surgical control of infection given there is no concern from vascular at the previous fem-pop site for any ascending infection.   Vascular consult pending. No changes for IV antibiotics recommended - continue current plan until surgery plans.      Plan: Continue current IV antibiotics until surgery plans definitive. Follow vascular evaluation to ensure no concern over fem pop site.     Principal Problem:   Acute osteomyelitis of right foot (HCC) Active Problems:   Hypertension   Arteriosclerotic cardiovascular disease (ASCVD)   Diabetes mellitus, type 2 (HCC)   Diabetic foot ulcer (HCC)    aspirin EC  81 mg Oral Daily   atorvastatin  20 mg Oral QPM   citalopram  20 mg Oral Daily   insulin aspart  0-5 Units Subcutaneous QHS   insulin aspart  0-9 Units Subcutaneous TID WC   lisinopril  10 mg Oral Daily   metoprolol tartrate  25 mg Oral BID    HPI: Christy [name] is a 61 y.o. female admitted from home for concern over worsening right foot infection and nausea.   Ms. Christy [name] says she has struggled with her foot for a few  years now. On and off antibiotics for this and underwent #4 and #5 ray amputations previously. She has been following with wound care clinic for management of a plantar ulcer along the lateral side of the right foot, off and on antibiotics for several months now between frequent UTIs and foot ulceration most recently a course of Bactrim at the end of 2022 and clindamycin about 3 weeks ago. She says that it has been worsening over the last 2 months and has become painful with bearing weight on it and it has developed more redness that has ascended to the medial shin/calf where her previous saphenous vein popliteal bypass surgery incision was. No pain in the calf. She has had chills and sweats that have resolved since coming here for IV antibiotics. She was recommended a BKA today and plans to proceed with that.   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   We suggested she should consider smoking cessation - she is pre-contemplative of this.  Diabetes with recent a1C 7.5%    Review of Systems: Review of Systems  Constitutional:  Positive for chills and diaphoresis. Negative for fever, malaise/fatigue and weight loss.  HENT:  Negative for sore throat.        No dental problems  Respiratory:  Negative for cough and  sputum production.   Cardiovascular:  Negative for chest pain and leg swelling.  Gastrointestinal:  Negative for abdominal pain, diarrhea and vomiting.  Genitourinary:  Negative for dysuria and flank pain.  Musculoskeletal:  Positive for joint pain (Rt foot). Negative for myalgias and neck pain.  Skin:  Negative for rash.  Neurological:  Negative for dizziness, tingling and headaches.  Psychiatric/Behavioral:  Negative for depression and substance abuse. The patient is not nervous/anxious and does not have insomnia.      Past Medical History:  Diagnosis Date    Arteriosclerotic cardiovascular disease (ASCVD) 09/2010   ST elevation MI  with DES to LAD X 2.   Arthritis    Chronic kidney disease    recent UTI   Coronary artery disease    Depression 2012   Following sudden death of husband   Diabetes mellitus, type 2 (HCC)    Hypertension    Kidney cysts    Metabolic syndrome    Elevated triglycerides, low HDL, fasting hyperglycemia with low-dose sulfonylurea   Myocardial infarction (HCC) 09/2010   PONV (postoperative nausea and vomiting)    Tobacco abuse    60-90-pack-year history; 1/3 pack per day in 2013    Social History   Tobacco Use   Smoking status: Some Days    Packs/day: 1.00    Years: 30.00    Pack years: 30.00    Types: Cigarettes    Start date: 10/18/1983   Smokeless tobacco: Never  Vaping Use   Vaping Use: Never used  Substance Use Topics   Alcohol use: No   Drug use: No    Family History  Problem Relation Age of Onset   Heart disease Father    Hyperlipidemia Father    Hypertension Father    Heart disease Mother    Diabetes Mother    Hyperlipidemia Mother    Hypertension Mother    Glaucoma Mother    Varicose Veins Sister    Cancer Brother    Cancer Maternal Grandfather    Diabetes Maternal Grandmother    Heart disease Maternal Grandmother    Hypertension Maternal Grandmother    COPD Paternal Grandfather    Hyperlipidemia Sister     Allergies  Allergen Reactions   Bactrim Diarrhea   Isosorbide Mononitrate Other (See Comments)    Made patient feel as though she was about to have another MI   Keflex [Cephalexin] Other (See Comments)    Yeast infection    OBJECTIVE: Blood pressure (!) 123/58, pulse 64, temperature 98.5 F (36.9 C), temperature source Oral, resp. rate 16, height 5' 5.4" (1.661 m), weight 96.6 kg, SpO2 90 %.  Physical Exam Vitals reviewed.  Constitutional:      General: She is not in acute distress.    Appearance: She is well-developed. She is not toxic-appearing.     Comments:  Resting in bed. No distress.   HENT:     Mouth/Throat:     Mouth: No oral lesions.     Dentition: Normal dentition. No dental abscesses.     Pharynx: No oropharyngeal exudate.  Cardiovascular:     Rate and Rhythm: Normal rate and regular rhythm.     Heart sounds: Normal heart sounds.  Pulmonary:     Effort: Pulmonary effort is normal.     Breath sounds: Normal breath sounds.  Abdominal:     General: There is no distension.     Palpations: Abdomen is soft.     Tenderness: There is no abdominal tenderness.  Musculoskeletal:     Comments: Rt foot with plantar ulcer containing purulent material. Blood on foam dressing. Erythema extending up to medial calf near old incision for bypass. No fluctuance or tenderness along calf.   Lymphadenopathy:     Cervical: No cervical adenopathy.  Skin:    General: Skin is warm and dry.     Findings: No rash.  Neurological:     Mental Status: She is alert and oriented to person, place, and time.  Psychiatric:        Judgment: Judgment normal.    Lab Results Lab Results  Component Value Date   WBC 7.5 03/12/2021   HGB 8.8 (L) 03/12/2021   HCT 27.7 (L) 03/12/2021   MCV 89.9 03/12/2021   PLT 473 (H) 03/12/2021    Lab Results  Component Value Date   CREATININE 1.26 (H) 03/12/2021   BUN 21 (H) 03/12/2021   NA 128 (L) 03/12/2021   K 5.2 (H) 03/12/2021   CL 94 (L) 03/12/2021   CO2 25 03/12/2021    Lab Results  Component Value Date   ALT 53 (H) 03/11/2021   AST 61 (H) 03/11/2021   ALKPHOS 127 (H) 03/11/2021   BILITOT 0.3 03/11/2021     Microbiology: Recent Results (from the past 240 hour(s))  Resp Panel by RT-PCR (Flu A&B, Covid) Nasopharyngeal Swab     Status: None   Collection Time: 03/10/21  2:57 PM   Specimen: Nasopharyngeal Swab; Nasopharyngeal(NP) swabs in vial transport medium  Result Value Ref Range Status   SARS Coronavirus 2 by RT PCR NEGATIVE NEGATIVE Final    Comment: (NOTE) SARS-CoV-2 target nucleic acids are NOT  DETECTED.  The SARS-CoV-2 RNA is generally detectable in upper respiratory specimens during the acute phase of infection. The lowest concentration of SARS-CoV-2 viral copies this assay can detect is 138 copies/mL. A negative result does not preclude SARS-Cov-2 infection and should not be used as the sole basis for treatment or other patient management decisions. A negative result may occur with  improper specimen collection/handling, submission of specimen other than nasopharyngeal swab, presence of viral mutation(s) within the areas targeted by this assay, and inadequate number of viral copies(<138 copies/mL). A negative result must be combined with clinical observations, patient history, and epidemiological information. The expected result is Negative.  Fact Sheet for Patients:  BloggerCourse.com  Fact Sheet for Healthcare Providers:  SeriousBroker.it  This test is no t yet approved or cleared by the [place] FDA and  has been authorized for detection and/or diagnosis of SARS-CoV-2 by FDA under an Emergency Use Authorization (EUA). This EUA will remain  in effect (meaning this test can be used) for the duration of the COVID-19 declaration under Section 564(b)(1) of the Act, 21 U.S.C.section 360bbb-3(b)(1), unless the authorization is terminated  or revoked sooner.       Influenza A by PCR NEGATIVE NEGATIVE Final   Influenza B by PCR NEGATIVE NEGATIVE Final    Comment: (NOTE) The Xpert Xpress SARS-CoV-2/FLU/RSV plus assay is intended as an aid in the diagnosis of influenza from Nasopharyngeal swab specimens and should not be used as a sole basis for treatment. Nasal washings and aspirates are unacceptable for Xpert Xpress SARS-CoV-2/FLU/RSV testing.  Fact Sheet for Patients: BloggerCourse.com  Fact Sheet for Healthcare Providers: SeriousBroker.it  This test is not yet  approved or cleared by the [place] FDA and has been authorized for detection and/or diagnosis of SARS-CoV-2 by FDA under an Emergency Use Authorization (EUA). This EUA will  remain in effect (meaning this test can be used) for the duration of the COVID-19 declaration under Section 564(b)(1) of the Act, 21 U.S.C. section 360bbb-3(b)(1), unless the authorization is terminated or revoked.  Performed at [facility], [address], [place], [place] [address]   Culture, blood (routine x 2)     Status: None (Preliminary result)   Collection Time: 03/11/21 12:10 AM   Specimen: Right Antecubital; Blood  Result Value Ref Range Status   Specimen Description RIGHT ANTECUBITAL  Final   Special Requests   Final    BOTTLES DRAWN AEROBIC AND ANAEROBIC Blood Culture adequate volume   Culture   Final    NO GROWTH 1 DAY Performed at [facility], [address], [place], [place] [address]    Report Status PENDING  Incomplete  Culture, blood (routine x 2)     Status: None (Preliminary result)   Collection Time: 03/11/21 12:10 AM   Specimen: BLOOD RIGHT FOREARM  Result Value Ref Range Status   Specimen Description BLOOD RIGHT FOREARM  Final   Special Requests   Final    BOTTLES DRAWN AEROBIC AND ANAEROBIC Blood Culture results may not be optimal due to an excessive volume of blood received in culture bottles   Culture   Final    NO GROWTH 1 DAY Performed at [facility], [address] [place], [place] [address]    Report Status PENDING  Incomplete    Rexene Alberts, MSN, NP-C [facility] Pager: [phone]  03/12/2021 12:46 PM

## 2021-03-12 NOTE — Consult Note (Addendum)
Hospital Consult    Reason for Consult:  R leg osteomyelitis Requesting Physician:  Dr. Tawanna Solo MRN #:  [id number]  History of Present Illness: This is a 61 y.o. female with Type II DM, HTN, CKD III, tobacco abuse, charcot foot, and peripheral artery disease with history of RLE bypass and 4th and 5th toe amputations. She is familiar to [facility] as a patient of Dr. Scot Dock. She was last seen in our office in 06-24-13. She explains that she presented to [facility] ER two days ago with increased pain and swelling in her foot. She says that her foot has been bad over the past year but has worsened over the past 1-2 months. She has been going to wound care and has several rounds of antibiotics. She denies any pain in her leg on ambulation in the calf or thigh. She does have pain in her foot on weightbearing and sometimes this will cause her calf to be painful when the swelling is increased. Otherwise she describes no pain at rest. She has limited sensation in her right foot secondary to neuropathy. On presentation her WBC was mildly elevated at 11k. Blood cultures are so far negative. She has been afebrile. She has been initiated on broad spectrum Abx. MRI was obtained of her foot showing osteomyelitis in the metatarsals and tarsals with charcot foot as well as abscess. ABI obtained at [facility] showed Right ABI of 0.72, Left ABI 0.50 with multiphasic waveforms on the right and monophasic on the left. She has history of right  femoral to below knee popliteal bypass on 10/19/13 by Dr. Scot Dock. Vascular surgery was consulted for evaluation of her arterial disease.  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   Arthritis    Chronic kidney disease    recent UTI   Coronary artery disease    Depression 06/25/2010   Following sudden death of husband   Diabetes mellitus, type 2 ([place])    Hypertension    Kidney cysts    Metabolic syndrome     Elevated triglycerides, low HDL, fasting hyperglycemia with low-dose sulfonylurea   Myocardial infarction ([place]) 09/2010   PONV (postoperative nausea and vomiting)    Tobacco abuse    60-90-pack-year history; 1/3 pack per day in Jun 25, 2011    Past Surgical History:  Procedure Laterality Date   ABDOMINAL AORTAGRAM N/A 10/09/2013   Procedure: ABDOMINAL Maxcine Ham;  Surgeon: Angelia Mould, MD;  Location[facility] CATH LAB;  Service: Cardiovascular;  Laterality: N/A;   AMPUTATION Right 10/19/2013   Procedure: AMPUTATION DIGIT-RIGHT 4TH TOE AMPUTATION;  Surgeon: Angelia Mould, MD;  Location: [facility];  Service: Vascular;  Laterality: Right;   APPLICATION OF WOUND VAC Right 11/24/2012   Procedure: APPLICATION OF WOUND VAC;  Surgeon: Marcheta Grammes, DPM;  Location: [facility];  Service: Orthopedics;  Laterality: Right;   BILATERAL OOPHORECTOMY  2009-06-24   for benign mass   BREAST SURGERY Left    brest biopsy   CHOLECYSTECTOMY     CORONARY ANGIOPLASTY WITH STENT PLACEMENT  10/01/10   DES x2   FEMORAL-POPLITEAL BYPASS GRAFT Right 10/19/2013   Procedure: RIGHT FEMORAL-BELOW KNEE POPLITEAL ARTERY BYPASS GRAFT;  Surgeon: Angelia Mould, MD;  Location: [facility];  Service: Vascular;  Laterality: Right;   INCISION AND DRAINAGE OF WOUND Right 11/24/2012   Procedure: DEBRIDEMENT WOUND FOOT;  Surgeon: Marcheta Grammes, DPM;  Location: [facility];  Service:  Orthopedics;  Laterality: Right;   INCISION AND DRAINAGE OF WOUND Right 03/23/2013   Procedure: DEBRIDEMENT OF INFECTED BONE 5TH METATARSAL RIGHT FOOT;  Surgeon: Dallas Schimke, DPM;  Location: [facility];  Service: Orthopedics;  Laterality: Right;   INTRAOPERATIVE ARTERIOGRAM Right 10/19/2013   Procedure: INTRA OPERATIVE ARTERIOGRAM;  Surgeon: Chuck Hint, MD;  Location: [facility] OR;  Service: Vascular;  Laterality: Right;   METATARSAL HEAD EXCISION Right 11/03/2012   Procedure: AMPUTATION OF RIGHT FIFTH TOE AND PORTION OF FIFTH METATARSAL;  Surgeon:  Dallas Schimke, DPM;  Location: [facility];  Service: Orthopedics;  Laterality: Right;   TOE AMPUTATION      Allergies  Allergen Reactions   Bactrim Diarrhea   Isosorbide Mononitrate Other (See Comments)    Made patient feel as though she was about to have another MI   Keflex [Cephalexin] Other (See Comments)    Yeast infection    Prior to Admission medications   Medication Sig Start Date End Date Taking? Authorizing Provider  aspirin EC 81 MG tablet Take 81 mg by mouth daily.   Yes [provider]  atorvastatin (LIPITOR) 20 MG tablet Take 20 mg by mouth every evening. 12/27/20  Yes [provider]  Cholecalciferol (VITAMIN D3 PO) Take 1,000 mg by mouth daily.   Yes [provider]  citalopram (CELEXA) 20 MG tablet Take 20 mg by mouth daily. 01/24/21  Yes [provider]  fish oil-omega-3 fatty acids 1000 MG capsule Take 3 g by mouth daily.    Yes [provider]  glipiZIDE (GLUCOTROL) 5 MG tablet Take 5 mg by mouth 2 (two) times daily before a meal.   Yes [provider]  lisinopril (ZESTRIL) 10 MG tablet Take 10 mg by mouth daily. 10/17/18  Yes [provider]  metFORMIN (GLUCOPHAGE) 500 MG tablet Take 500 mg by mouth 2 (two) times daily with a meal.   Yes [provider]  metoprolol tartrate (LOPRESSOR) 25 MG tablet Take 1 tablet (25 mg total) by mouth 2 (two) times daily. 07/20/12  Yes Rothbart, Gerrit Friends, MD  niacin 500 MG CR capsule Take 1 capsule (500 mg total) by mouth at bedtime. 12/25/11  Yes Dyann Kief, PA-C  nitroGLYCERIN (NITROSTAT) 0.4 MG SL tablet Place 1 tablet (0.4 mg total) under the tongue every 5 (five) minutes as needed. 07/20/12  Yes Rothbart, Gerrit Friends, MD  atorvastatin (LIPITOR) 10 MG tablet Take 1 tablet (10 mg total) by mouth daily. Patient not taking: Reported on 03/10/2021 10/17/13   Laqueta Linden, MD  cefpodoxime (VANTIN) 200 MG tablet Take 1 tablet (200 mg total) by mouth 2 (two)  times daily. Patient not taking: Reported on 03/10/2021 02/06/21   Teressa Lower, PA-C  doxepin (SINEQUAN) 25 MG capsule Take 1 capsule (25 mg total) by mouth at bedtime. Patient not taking: Reported on 03/10/2021 08/23/18   Donnetta Hutching, MD  lithium 300 MG tablet Take 2 tablets (600 mg total) by mouth daily with supper. Patient not taking: Reported on 03/10/2021 03/11/18   Melony Overly T, PA-C  oxyCODONE (ROXICODONE) 5 MG immediate release tablet Take 1 tablet (5 mg total) by mouth every 6 (six) hours as needed for severe pain. Patient not taking: Reported on 03/10/2021 10/21/13   Dara Lords, PA-C    Social History   Socioeconomic History   Marital status: Widowed    Spouse name: Not on file   Number of children: 1   Years of education: Not  on file   Highest education level: 12th grade  Occupational History   Occupation: Chartered certified accountant: [employer]    Comment: [employer]  Tobacco Use   Smoking status: Some Days    Packs/day: 1.00    Years: 30.00    Pack years: 30.00    Types: Cigarettes    Start date: 10/18/1983   Smokeless tobacco: Never  Vaping Use   Vaping Use: Never used  Substance and Sexual Activity   Alcohol use: No   Drug use: No   Sexual activity: Never    Partners: Male    Birth control/protection: Surgical  Other Topics Concern   Not on file  Social History Narrative   Widowed x 7 years Was married 14 years.  It was 3rd marriage.    Has a 61 yr old dtr w/ 1st husband.    Was born and raised in [place]. Now lives in [place], [place].   Grew up w/ both parents in home.  2 sisters, 1 brother.  Never abused.    No legal trouble.   [employer] not in church now.   Caffeine diet coke all day.   Social Determinants of Health   Financial Resource Strain: Not on file  Food Insecurity: Not on file  Transportation Needs: Not on file  Physical Activity: Not on file  Stress: Not on file  Social Connections: Not on file  Intimate Partner  Violence: Not on file     Family History  Problem Relation Age of Onset   Heart disease Father    Hyperlipidemia Father    Hypertension Father    Heart disease Mother    Diabetes Mother    Hyperlipidemia Mother    Hypertension Mother    Glaucoma Mother    Varicose Veins Sister    Cancer Brother    Cancer Maternal Grandfather    Diabetes Maternal Grandmother    Heart disease Maternal Grandmother    Hypertension Maternal Grandmother    COPD Paternal Grandfather    Hyperlipidemia Sister     ROS: Otherwise negative unless mentioned in HPI  Physical Examination  Vitals:   03/12/21 0536 03/12/21 1417  BP: (!) 123/58 (!) 117/49  Pulse: 64 65  Resp: 16 17  Temp: 98.5 F (36.9 C) 98 F (36.7 C)  SpO2: 90% 96%   Body mass index is 35.01 kg/m.  General:  WDWN in NAD Gait: Not observed HENT: WNL, normocephalic Pulmonary: normal non-labored breathing, without wheezing Cardiac: regular, without  Murmurs Abdomen: obese, soft Vascular Exam/Pulses: 2+ femoral pulses bilaterally, no palpable distal pulses bilaterally. Doppler right PT and DP pulses Extremities: without ischemic changes, without Gangrene , with cellulitis of right foot; with open wounds of plantar 4th TMT joint. Foot is warm. No sensation.  Musculoskeletal: no muscle wasting or atrophy  Neurologic: A&O X 3;  No focal weakness or paresthesias are detected; speech is fluent/normal Psychiatric:  The pt has Normal affect.   CBC    Component Value Date/Time   WBC 7.5 03/12/2021 0852   RBC 3.08 (L) 03/12/2021 0852   HGB 8.8 (L) 03/12/2021 0852   HCT 27.7 (L) 03/12/2021 0852   PLT 473 (H) 03/12/2021 0852   MCV 89.9 03/12/2021 0852   MCH 28.6 03/12/2021 0852   MCHC 31.8 03/12/2021 0852   RDW 15.7 (H) 03/12/2021 0852   LYMPHSABS 1.7 03/12/2021 0852   MONOABS 0.5 03/12/2021 0852   EOSABS 0.1 03/12/2021 0852   BASOSABS 0.1 03/12/2021  [id number]    BMET    Component Value Date/Time   NA 128 (L) 03/12/2021 0852    K 5.2 (H) 03/12/2021 0852   CL 94 (L) 03/12/2021 0852   CO2 25 03/12/2021 0852   GLUCOSE 208 (H) 03/12/2021 0852   BUN 21 (H) 03/12/2021 0852   CREATININE 1.26 (H) 03/12/2021 0852   CREATININE 1.16 (H) 07/14/2011 0921   CALCIUM 9.1 03/12/2021 0852   GFRNONAA 49 (L) 03/12/2021 0852   GFRAA 59 (L) 10/20/2013 0312    COAGS: Lab Results  Component Value Date   INR 0.99 10/13/2013   INR 1.08 10/01/2010     Non-Invasive Vascular Imaging:   MRI Foot Right WO Contrast: 03/10/21 IMPRESSION: 1. 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 2. In the setting of advanced neuropathic changes, assessment for osteomyelitis is limited. However, given apparent skin ulceration along the plantar aspect of the remaining distal 4th metatarsal and adjacent complex fluid, the findings are suspicious for superimposed osteomyelitis. Of note, the proximal extent is difficult to assess on this examination of the forefoot. If additional amputation is planned, consider additional examination of the hindfoot/ankle. 3. Multiple complex fluid collections surrounding the midfoot with complex fluid in the extensor hallucis longus tendon sheath, suspicious for infectious tenosynovitis.   FINDINGS: Right ABI:  0.72 (previously 0.87)   Left ABI:  0.50 (previously 0.36)   Right Lower Extremity:  Multiphasic arterial waveforms at the ankle.   Left Lower Extremity:  Monophasic arterial waveforms at the ankle.  Arterial duplex RLE pending   Statin:  Yes.   Beta Blocker:  Yes.   Aspirin:  Yes.   ACEI:  Yes.   ARB:  No. CCB use:  No Other antiplatelets/anticoagulants:  No.    ASSESSMENT/PLAN: This is a 61 y.o. female who presents with osteomyelitis/ cellulitis right foot with chronic Charcot foot and known PAD. Her foot does not appear to be salvageable. She has biphasic doppler signals in her right foot. Her ABI is consistent with moderate PAD on the right. I have  ordered RLE arterial duplex to evaluate if RLE bypass graft is patent. She has been evaluated by orthopedics as well who are recommending below knee amputation. She may benefit from arteriography if her duplex shows any concerning findings for malperfusion of her right leg prior to amputation. The on call Vascular surgeon, Dr. Trula Slade will see her later this evening and will provide further management plans   Karoline Caldwell PA-C [facility] [phone] 03/12/2021  3:02 PM  I agree with the above.  I have seen and evaluated the patient, reviewed her records and her imaging studies.  She underwent right femoral to below-knee popliteal bypass graft with vein by Dr. Scot Dock in 2015.  She has been lost to follow-up.  She now has osteomyelitis in her right foot.  After reviewing her MRI images, I do not think that there is a way to salvage her foot.  I discussed proceeding with a right below-knee amputation.  She is scheduled to get vascular lab studies tomorrow, which will determine whether or not her bypass graft remains patent.  I plan on proceeding with a right below-knee amputation on Friday.  Risks and benefits of the procedure were discussed with the patient including nonhealing and the possibility of a proximal revision.  All questions were answered.  She will need to be n.p.o. after midnight tomorrow night  Wells Matti Minney

## 2021-03-12 NOTE — Consult Note (Signed)
Reason for Consult:Right foot osteo Referring Physician: Shelly Coss Time called: [id number] Time at bedside: [place] is an 61 y.o. female.  HPI: Shamaree presented to the ED yesterday with increasing foot pain and swelling. She's been dealing with right foot problems for a while and has been seeing wound care for this. 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 MRI was obtained that showed osteo in the MT and tarsals in the setting of a Charcot foot and orthopedic surgery was consulted.  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   Arthritis    Chronic kidney disease    recent UTI   Coronary artery disease    Depression June 15, 2010   Following sudden death of husband   Diabetes mellitus, type 2 ([place])    Hypertension    Kidney cysts    Metabolic syndrome    Elevated triglycerides, low HDL, fasting hyperglycemia with low-dose sulfonylurea   Myocardial infarction ([place]) 09/2010   PONV (postoperative nausea and vomiting)    Tobacco abuse    60-90-pack-year history; 1/3 pack per day in 15-Jun-2011    Past Surgical History:  Procedure Laterality Date   ABDOMINAL AORTAGRAM N/A 10/09/2013   Procedure: ABDOMINAL Maxcine Ham;  Surgeon: Angelia Mould, MD;  Location: [facility] CATH LAB;  Service: Cardiovascular;  Laterality: N/A;   AMPUTATION Right 10/19/2013   Procedure: AMPUTATION DIGIT-RIGHT 4TH TOE AMPUTATION;  Surgeon: Angelia Mould, MD;  Location: [facility];  Service: Vascular;  Laterality: Right;   APPLICATION OF WOUND VAC Right 11/24/2012   Procedure: APPLICATION OF WOUND VAC;  Surgeon: Marcheta Grammes, DPM;  Location: [facility];  Service: Orthopedics;  Laterality: Right;   BILATERAL OOPHORECTOMY  06-14-2009   for benign mass   BREAST SURGERY Left    brest biopsy   CHOLECYSTECTOMY     CORONARY ANGIOPLASTY WITH STENT  PLACEMENT  10/01/10   DES x2   FEMORAL-POPLITEAL BYPASS GRAFT Right 10/19/2013   Procedure: RIGHT FEMORAL-BELOW KNEE POPLITEAL ARTERY BYPASS GRAFT;  Surgeon: Angelia Mould, MD;  Location: [facility];  Service: Vascular;  Laterality: Right;   INCISION AND DRAINAGE OF WOUND Right 11/24/2012   Procedure: DEBRIDEMENT WOUND FOOT;  Surgeon: Marcheta Grammes, DPM;  Location: [facility];  Service: Orthopedics;  Laterality: Right;   INCISION AND DRAINAGE OF WOUND Right 03/23/2013   Procedure: DEBRIDEMENT OF INFECTED BONE 5TH METATARSAL RIGHT FOOT;  Surgeon: Marcheta Grammes, DPM;  Location: [facility];  Service: Orthopedics;  Laterality: Right;   INTRAOPERATIVE ARTERIOGRAM Right 10/19/2013   Procedure: INTRA OPERATIVE ARTERIOGRAM;  Surgeon: Angelia Mould, MD;  Location: [facility];  Service: Vascular;  Laterality: Right;   METATARSAL HEAD EXCISION Right 11/03/2012   Procedure: AMPUTATION OF RIGHT FIFTH TOE AND PORTION OF FIFTH METATARSAL;  Surgeon: Marcheta Grammes, DPM;  Location[facility];  Service: Orthopedics;  Laterality: Right;   TOE AMPUTATION      Family History  Problem Relation Age of Onset   Heart disease Father    Hyperlipidemia Father    Hypertension Father    Heart disease Mother    Diabetes Mother    Hyperlipidemia Mother    Hypertension Mother    Glaucoma Mother    Varicose Veins Sister    Cancer Brother    Cancer Maternal Grandfather  Diabetes Maternal Grandmother    Heart disease Maternal Grandmother    Hypertension Maternal Grandmother    COPD Paternal Grandfather    Hyperlipidemia Sister     Social History:  reports that she has been smoking cigarettes. She started smoking about 37 years ago. She has a 30.00 pack-year smoking history. She has never used smokeless tobacco. She reports that she does not drink alcohol and does not use drugs.  Allergies:  Allergies  Allergen Reactions   Bactrim Diarrhea   Isosorbide Mononitrate Other (See Comments)    Made  patient feel as though she was about to have another MI   Keflex [Cephalexin] Other (See Comments)    Yeast infection    Medications: I have reviewed the patient's current medications.  Results for orders placed or performed during the hospital encounter of 03/10/21 (from the past 48 hour(s))  CBC with Differential/Platelet     Status: Abnormal   Collection Time: 03/10/21 12:42 PM  Result Value Ref Range   WBC 11.1 (H) 4.0 - 10.5 K/uL   RBC 3.26 (L) 3.87 - 5.11 MIL/uL   Hemoglobin 9.0 (L) 12.0 - 15.0 g/dL   HCT 30.4 (L) 36.0 - 46.0 %   MCV 93.3 80.0 - 100.0 fL   MCH 27.6 26.0 - 34.0 pg   MCHC 29.6 (L) 30.0 - 36.0 g/dL   RDW 15.6 (H) 11.5 - 15.5 %   Platelets 516 (H) 150 - 400 K/uL   nRBC 0.0 0.0 - 0.2 %   Neutrophils Relative % 77 %   Neutro Abs 8.5 (H) 1.7 - 7.7 K/uL   Lymphocytes Relative 14 %   Lymphs Abs 1.5 0.7 - 4.0 K/uL   Monocytes Relative 4 %   Monocytes Absolute 0.5 0.1 - 1.0 K/uL   Eosinophils Relative 0 %   Eosinophils Absolute 0.1 0.0 - 0.5 K/uL   Basophils Relative 0 %   Basophils Absolute 0.1 0.0 - 0.1 K/uL   Immature Granulocytes 5 %   Abs Immature Granulocytes 0.50 (H) 0.00 - 0.07 K/uL    Comment: Performed at [facility], [address] [place], [place] [id number]  Basic metabolic panel     Status: Abnormal   Collection Time: 03/10/21 12:42 PM  Result Value Ref Range   Sodium 130 (L) 135 - 145 mmol/L   Potassium 4.6 3.5 - 5.1 mmol/L   Chloride 94 (L) 98 - 111 mmol/L   CO2 23 22 - 32 mmol/L   Glucose, Bld 155 (H) 70 - 99 mg/dL    Comment: Glucose reference range applies only to samples taken after fasting for at least 8 hours.   BUN 23 (H) 6 - 20 mg/dL   Creatinine, Ser 1.47 (H) 0.44 - 1.00 mg/dL   Calcium 9.0 8.9 - 10.3 mg/dL   GFR, Estimated 41 (L) >60 mL/min    Comment: (NOTE) Calculated using the CKD-EPI Creatinine Equation (2021)    Anion gap 13 5 - 15    Comment: Performed at [facility], [address], [place], [place] [address]  Resp Panel  by RT-PCR (Flu A&B, Covid) Nasopharyngeal Swab     Status: None   Collection Time: 03/10/21  2:57 PM   Specimen: Nasopharyngeal Swab; Nasopharyngeal(NP) swabs in vial transport medium  Result Value Ref Range   SARS Coronavirus 2 by RT PCR NEGATIVE NEGATIVE    Comment: (NOTE) SARS-CoV-2 target nucleic acids are NOT DETECTED.  The SARS-CoV-2 RNA is generally detectable in upper respiratory specimens during the acute  phase of infection. The lowest concentration of SARS-CoV-2 viral copies this assay can detect is 138 copies/mL. A negative result does not preclude SARS-Cov-2 infection and should not be used as the sole basis for treatment or other patient management decisions. A negative result may occur with  improper specimen collection/handling, submission of specimen other than nasopharyngeal swab, presence of viral mutation(s) within the areas targeted by this assay, and inadequate number of viral copies(<138 copies/mL). A negative result must be combined with clinical observations, patient history, and epidemiological information. The expected result is Negative.  Fact Sheet for Patients:  BloggerCourse.comhttps://www.fda.gov/media/152166/download  Fact Sheet for Healthcare Providers:  SeriousBroker.ithttps://www.fda.gov/media/152162/download  This test is no t yet approved or cleared by the [place] FDA and  has been authorized for detection and/or diagnosis of SARS-CoV-2 by FDA under an Emergency Use Authorization (EUA). This EUA will remain  in effect (meaning this test can be used) for the duration of the COVID-19 declaration under Section 564(b)(1) of the Act, 21 U.S.C.section 360bbb-3(b)(1), unless the authorization is terminated  or revoked sooner.       Influenza A by PCR NEGATIVE NEGATIVE   Influenza B by PCR NEGATIVE NEGATIVE    Comment: (NOTE) The Xpert Xpress SARS-CoV-2/FLU/RSV plus assay is intended as an aid in the diagnosis of influenza from Nasopharyngeal swab specimens and should not  be used as a sole basis for treatment. Nasal washings and aspirates are unacceptable for Xpert Xpress SARS-CoV-2/FLU/RSV testing.  Fact Sheet for Patients: BloggerCourse.comhttps://www.fda.gov/media/152166/download  Fact Sheet for Healthcare Providers: SeriousBroker.ithttps://www.fda.gov/media/152162/download  This test is not yet approved or cleared by the [place] FDA and has been authorized for detection and/or diagnosis of SARS-CoV-2 by FDA under an Emergency Use Authorization (EUA). This EUA will remain in effect (meaning this test can be used) for the duration of the COVID-19 declaration under Section 564(b)(1) of the Act, 21 U.S.C. section 360bbb-3(b)(1), unless the authorization is terminated or revoked.  Performed at [facility], [address] [place], [place] [address]   Urinalysis, Routine w reflex microscopic     Status: Abnormal   Collection Time: 03/10/21  3:06 PM  Result Value Ref Range   Color, Urine AMBER (A) YELLOW    Comment: BIOCHEMICALS MAY BE AFFECTED BY COLOR   APPearance CLOUDY (A) CLEAR   Specific Gravity, Urine 1.025 1.005 - 1.030   pH 5.0 5.0 - 8.0   Glucose, UA 50 (A) NEGATIVE mg/dL   Hgb urine dipstick NEGATIVE NEGATIVE   Bilirubin Urine NEGATIVE NEGATIVE   Ketones, ur 5 (A) NEGATIVE mg/dL   Protein, ur >=604>=300 (A) NEGATIVE mg/dL   Nitrite NEGATIVE NEGATIVE   Leukocytes,Ua NEGATIVE NEGATIVE   RBC / HPF 0-5 0 - 5 RBC/hpf   WBC, UA 6-10 0 - 5 WBC/hpf   Bacteria, UA RARE (A) NONE SEEN   Squamous Epithelial / LPF 11-20 0 - 5   Mucus PRESENT    Hyaline Casts, UA PRESENT    Granular Casts, UA PRESENT     Comment: Performed at [facility], [address] [place], [place] [address]  CBG monitoring, ED     Status: Abnormal   Collection Time: 03/10/21 11:20 PM  Result Value Ref Range   Glucose-Capillary 169 (H) 70 - 99 mg/dL    Comment: Glucose reference range applies only to samples taken after fasting for at least 8 hours.  Culture, blood (routine x 2)     Status: None  (Preliminary result)   Collection Time: 03/11/21 12:10 AM  Specimen: Right Antecubital; Blood  Result Value Ref Range   Specimen Description RIGHT ANTECUBITAL    Special Requests      BOTTLES DRAWN AEROBIC AND ANAEROBIC Blood Culture adequate volume   Culture      NO GROWTH 1 DAY Performed at [facility], [address] [place], [place] [address]    Report Status PENDING   Culture, blood (routine x 2)     Status: None (Preliminary result)   Collection Time: 03/11/21 12:10 AM   Specimen: BLOOD RIGHT FOREARM  Result Value Ref Range   Specimen Description BLOOD RIGHT FOREARM    Special Requests      BOTTLES DRAWN AEROBIC AND ANAEROBIC Blood Culture results may not be optimal due to an excessive volume of blood received in culture bottles   Culture      NO GROWTH 1 DAY Performed at [facility], [address] [place], [place] [address]    Report Status PENDING   Sedimentation rate     Status: Abnormal   Collection Time: 03/11/21 12:10 AM  Result Value Ref Range   Sed Rate 40 (H) 0 - 22 mm/hr    Comment: Performed at [facility], [address], [place], [place] [address]  C-reactive protein     Status: Abnormal   Collection Time: 03/11/21 12:10 AM  Result Value Ref Range   CRP 17.1 (H) <1.0 mg/dL    Comment: Performed at [facility], [place] [address], [place], [place] [address]  Hemoglobin A1c     Status: Abnormal   Collection Time: 03/11/21 12:10 AM  Result Value Ref Range   Hgb A1c MFr Bld 7.5 (H) 4.8 - 5.6 %    Comment: (NOTE)         Prediabetes: 5.7 - 6.4         Diabetes: >6.4         Glycemic control for adults with diabetes: <7.0    Mean Plasma Glucose 169 mg/dL    Comment: (NOTE) Performed At: [facility] [place], [place] [id number] Rush Farmer MD [id number]   HIV Antibody (routine testing w rflx)     Status: None   Collection Time: 03/11/21 12:10 AM  Result Value Ref Range   HIV Screen 4th Generation wRfx Non Reactive Non  Reactive    Comment: Performed at [facility], [address], [place], [place] [address]  CBC     Status: Abnormal   Collection Time: 03/11/21 12:10 AM  Result Value Ref Range   WBC 8.4 4.0 - 10.5 K/uL   RBC 3.07 (L) 3.87 - 5.11 MIL/uL   Hemoglobin 8.9 (L) 12.0 - 15.0 g/dL   HCT 28.5 (L) 36.0 - 46.0 %   MCV 92.8 80.0 - 100.0 fL   MCH 29.0 26.0 - 34.0 pg   MCHC 31.2 30.0 - 36.0 g/dL   RDW 15.8 (H) 11.5 - 15.5 %   Platelets 514 (H) 150 - 400 K/uL   nRBC 0.0 0.0 - 0.2 %    Comment: Performed at [facility], [address], [place], [place] [address]  Comprehensive metabolic panel     Status: Abnormal   Collection Time: 03/11/21 12:10 AM  Result Value Ref Range   Sodium 129 (L) 135 - 145 mmol/L   Potassium 4.7 3.5 - 5.1 mmol/L   Chloride 96 (L) 98 - 111 mmol/L   CO2 26 22 - 32 mmol/L   Glucose, Bld 159 (H) 70 - 99  mg/dL    Comment: Glucose reference range applies only to samples taken after fasting for at least 8 hours.   BUN 28 (H) 6 - 20 mg/dL   Creatinine, Ser 4.09 (H) 0.44 - 1.00 mg/dL   Calcium 8.8 (L) 8.9 - 10.3 mg/dL   Total Protein 7.8 6.5 - 8.1 g/dL   Albumin 2.2 (L) 3.5 - 5.0 g/dL   AST 61 (H) 15 - 41 U/L   ALT 53 (H) 0 - 44 U/L   Alkaline Phosphatase 127 (H) 38 - 126 U/L   Total Bilirubin 0.3 0.3 - 1.2 mg/dL   GFR, Estimated 42 (L) >60 mL/min    Comment: (NOTE) Calculated using the CKD-EPI Creatinine Equation (2021)    Anion gap 7 5 - 15    Comment: Performed at [facility], [address], [place], [place] [address]  CBG monitoring, ED     Status: Abnormal   Collection Time: 03/11/21  4:14 AM  Result Value Ref Range   Glucose-Capillary 103 (H) 70 - 99 mg/dL    Comment: Glucose reference range applies only to samples taken after fasting for at least 8 hours.  CBG monitoring, ED     Status: Abnormal   Collection Time: 03/11/21  8:48 AM  Result Value Ref Range   Glucose-Capillary 123 (H) 70 - 99 mg/dL    Comment: Glucose reference range applies only to  samples taken after fasting for at least 8 hours.  CBG monitoring, ED     Status: Abnormal   Collection Time: 03/11/21  1:32 PM  Result Value Ref Range   Glucose-Capillary 172 (H) 70 - 99 mg/dL    Comment: Glucose reference range applies only to samples taken after fasting for at least 8 hours.  CBG monitoring, ED     Status: Abnormal   Collection Time: 03/11/21  4:03 PM  Result Value Ref Range   Glucose-Capillary 171 (H) 70 - 99 mg/dL    Comment: Glucose reference range applies only to samples taken after fasting for at least 8 hours.  Glucose, capillary     Status: Abnormal   Collection Time: 03/11/21 11:58 PM  Result Value Ref Range   Glucose-Capillary 137 (H) 70 - 99 mg/dL    Comment: Glucose reference range applies only to samples taken after fasting for at least 8 hours.  Glucose, capillary     Status: Abnormal   Collection Time: 03/12/21  7:43 AM  Result Value Ref Range   Glucose-Capillary 150 (H) 70 - 99 mg/dL    Comment: Glucose reference range applies only to samples taken after fasting for at least 8 hours.  CBC with Differential/Platelet     Status: Abnormal   Collection Time: 03/12/21  8:52 AM  Result Value Ref Range   WBC 7.5 4.0 - 10.5 K/uL   RBC 3.08 (L) 3.87 - 5.11 MIL/uL   Hemoglobin 8.8 (L) 12.0 - 15.0 g/dL   HCT 47.8 (L) 29.5 - 62.1 %   MCV 89.9 80.0 - 100.0 fL   MCH 28.6 26.0 - 34.0 pg   MCHC 31.8 30.0 - 36.0 g/dL   RDW 30.8 (H) 65.7 - 84.6 %   Platelets 473 (H) 150 - 400 K/uL   nRBC 0.0 0.0 - 0.2 %   Neutrophils Relative % 64 %   Neutro Abs 4.9 1.7 - 7.7 K/uL   Lymphocytes Relative 23 %   Lymphs Abs 1.7 0.7 - 4.0 K/uL   Monocytes Relative 7 %   Monocytes Absolute  0.5 0.1 - 1.0 K/uL   Eosinophils Relative 1 %   Eosinophils Absolute 0.1 0.0 - 0.5 K/uL   Basophils Relative 1 %   Basophils Absolute 0.1 0.0 - 0.1 K/uL   Immature Granulocytes 4 %   Abs Immature Granulocytes 0.30 (H) 0.00 - 0.07 K/uL    Comment: Performed at [facility] [address] [place], [place] [id number]  Basic metabolic panel     Status: Abnormal   Collection Time: 03/12/21  8:52 AM  Result Value Ref Range   Sodium 128 (L) 135 - 145 mmol/L   Potassium 5.2 (H) 3.5 - 5.1 mmol/L   Chloride 94 (L) 98 - 111 mmol/L   CO2 25 22 - 32 mmol/L   Glucose, Bld 208 (H) 70 - 99 mg/dL    Comment: Glucose reference range applies only to samples taken after fasting for at least 8 hours.   BUN 21 (H) 6 - 20 mg/dL   Creatinine, Ser 1.26 (H) 0.44 - 1.00 mg/dL   Calcium 9.1 8.9 - 10.3 mg/dL   GFR, Estimated 49 (L) >60 mL/min    Comment: (NOTE) Calculated using the CKD-EPI Creatinine Equation (2021)    Anion gap 9 5 - 15    Comment: Performed at [facility] [address], [place], [place] [address]  Iron and TIBC     Status: Abnormal   Collection Time: 03/12/21  8:52 AM  Result Value Ref Range   Iron 28 28 - 170 ug/dL   TIBC 301 250 - 450 ug/dL   Saturation Ratios 9 (L) 10.4 - 31.8 %   UIBC 273 ug/dL    Comment: Performed at [facility], [place] [address], [place], [place] [address]  Ferritin     Status: None   Collection Time: 03/12/21  8:52 AM  Result Value Ref Range   Ferritin 158 11 - 307 ng/mL    Comment: Performed at [facility], [place] [address], [place],  [address]    MR FOOT RIGHT WO CONTRAST  Result Date: 03/11/2021 CLINICAL DATA:  Diabetic with foot swelling. Osteomyelitis suspected. Previous amputations to 4th and 5th metatarsals. Open wounds. EXAM: MRI OF THE RIGHT FOREFOOT WITHOUT CONTRAST TECHNIQUE: Multiplanar, multisequence MR imaging of the right forefoot was performed. No intravenous contrast was administered. COMPARISON:  Right foot radiographs 03/10/2021 and 03/23/2013. FINDINGS: Bones/Joint/Cartilage Study includes the forefoot and midfoot. The hindfoot and ankle are not included. In correlation with prior radiographs, patient is status post remote amputation through the base of the 5th metatarsal and more recent amputation  through the mid 4th metatarsal. There is gross fragmentation, destruction and marrow T2 hyperintensity throughout the cuneiform bones, cuboid, navicular and bases of all the metatarsals which are posteriorly dislocated, findings most consistent with advanced neuropathic joint (Charcot) changes. 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 The marrow edema within the 3rd metatarsal extends into the distal shaft. The remaining toes are intact. There is nonspecific low-level marrow T2 hyperintensity within the visualized talar head and medial malleolus. Ligaments Fracture dislocation at the Lisfranc joint. The collateral ligaments of the remaining metatarsophalangeal joints appear grossly intact. Muscles and Tendons There is a large amount of complex fluid within the extensor hallucis longus tendon, suspicious for infectious tenosynovitis. No gross flexor tendon abnormality identified. Nonspecific T2 hyperintensity throughout the forefoot musculature. Soft tissues There is apparent soft tissue ulceration along  the plantar aspect of the remaining 4th metatarsal, and there is a complex fluid collection surrounding the 4th metatarsal, measuring up to 3.3 x 2.0 cm transverse on image 32/11. Multiple other complex fluid collections are present within the midfoot, most notable medially. No other discrete areas of skin ulceration are identified. IMPRESSION: 1. 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 2. In the setting of advanced neuropathic changes, assessment for osteomyelitis is limited. However, given apparent skin ulceration along the plantar aspect of the remaining distal 4th metatarsal and adjacent complex fluid, the findings are suspicious for superimposed osteomyelitis. Of note, the proximal extent is difficult to assess on this examination of the forefoot. If additional amputation is  planned, consider additional examination of the hindfoot/ankle. 3. Multiple complex fluid collections surrounding the midfoot with complex fluid in the extensor hallucis longus tendon sheath, suspicious for infectious tenosynovitis. Electronically Signed   By: Richardean Sale M.D.   On: 03/11/2021 08:52   US ARTERIAL ABI (SCREENING LOWER EXTREMITY)  Result Date: 03/11/2021 CLINICAL DATA:  Right plantar foot wound x1 year, redness and edema x1 month, previous fourth and fifth toe amputation, previous fem-pop bypass. EXAM: NONINVASIVE PHYSIOLOGIC VASCULAR STUDY OF BILATERAL LOWER EXTREMITIES TECHNIQUE: Evaluation of both lower extremities were performed at rest, including calculation of ankle-brachial indices with single level Doppler, pressure and pulse volume recording. COMPARISON:  01/24/2014 FINDINGS: Right ABI:  0.72 (previously 0.87) Left ABI:  0.50 (previously 0.36) Right Lower Extremity:  Multiphasic arterial waveforms at the ankle. Left Lower Extremity:  Monophasic arterial waveforms at the ankle. IMPRESSION: 1. Moderately severe bilateral lower extremity arterial occlusive disease at rest, slightly worse on the right and improved on the left since prior study. Electronically Signed   By: Lucrezia Europe M.D.   On: 03/11/2021 10:46   DG Foot Complete Right  Result Date: 03/10/2021 CLINICAL DATA:  Diabetic foot wound without known injury. EXAM: RIGHT FOOT COMPLETE - 3+ VIEW COMPARISON:  March 23, 2013. FINDINGS: Status post amputation involving the fourth and fifth metatarsals and phalanges. Diffuse soft tissue swelling is noted concerning for cellulitis. Lytic destruction involving the proximal portion of the first metatarsal is noted with pathologic fracture. Lytic destruction is also seen involving the cuboid bone with pathologic fracture. Lytic destruction of the navicular bone is also noted. Dislocation of the second and third metatarsals is noted as well. IMPRESSION: Lytic destruction is seen  involving the first metatarsal, cuboid and navicular bones consistent with osteomyelitis. Status post amputation of the fourth and fifth metatarsals and phalanges is noted. Electronically Signed   By: Marijo Conception M.D.   On: 03/10/2021 12:56    Review of Systems  Constitutional:  Negative for chills, diaphoresis and fever.  HENT:  Negative for ear discharge, ear pain, hearing loss and tinnitus.   Eyes:  Negative for photophobia and pain.  Respiratory:  Negative for cough and shortness of breath.   Cardiovascular:  Negative for chest pain.  Gastrointestinal:  Negative for abdominal pain, nausea and vomiting.  Genitourinary:  Negative for dysuria, flank pain, frequency and urgency.  Musculoskeletal:  Positive for arthralgias (Right foot (when WB)). Negative for back pain, myalgias and neck pain.  Neurological:  Negative for dizziness and headaches.  Hematological:  Does not bruise/bleed easily.  Psychiatric/Behavioral:  The patient is not nervous/anxious.   Blood pressure (!) 123/58, pulse 64, temperature 98.5 F (36.9 C), temperature source Oral, resp. rate 16, height 5' 5.4" (1.661 m), weight 96.6 kg, SpO2 90 %.  Physical Exam Constitutional:      General: She is not in acute distress.    Appearance: She is well-developed. She is not diaphoretic.  HENT:     Head: Normocephalic and atraumatic.  Eyes:     General: No scleral icterus.       Right eye: No discharge.        Left eye: No discharge.     Conjunctiva/sclera: Conjunctivae normal.  Cardiovascular:     Rate and Rhythm: Normal rate and regular rhythm.  Pulmonary:     Effort: Pulmonary effort is normal. No respiratory distress.  Musculoskeletal:     Cervical back: Normal range of motion.  Feet:     Comments: Right foot: 4+ NP edema forefoot, small dry ulceration plantar 4th TMT joint. Absent sensation, absent pulses. Skin:    General: Skin is warm and dry.  Neurological:     Mental Status: She is alert.  Psychiatric:         Mood and Affect: Mood normal.        Behavior: Behavior normal.    Assessment/Plan: Right foot osteo -- Needs BKA at this point. Recommend vascular surgery consultation to make sure no optimization recommended. BKA to be done by vascular or [place], will defer to vascular for their preference.    Lisette Abu, PA-C Orthopedic Surgery [phone] 03/12/2021, 11:56 AM

## 2021-03-12 NOTE — Progress Notes (Signed)
PROGRESS NOTE    BAY KASZYNSKI  [id number] DOB: 1960/04/18 DOA: 03/10/2021 PCP: Wanita Chamberlain, PA-C   Chief Complain: Ulcer on the right foot, swelling  Brief Narrative: Patient is a 61 year old female with history of peripheral vascular disease with amputation of right fourth and fifth metatarsal, phalanges, diabetes type 2 hypertension, CKD stage IIIb who presented to the emergency department at [facility] with complaints of ulcer on the right foot and swelling.  She was following up with wound care.  She was complaining of pain with weightbearing.  X-ray of the right foot showed lytic destruction involving the fifth metatarsal cuboid and navicular bone consistent with osteomyelitis.  MRI showed advanced neuropathic foot consistent with osteomyelitis, developed complex fluid collection surrounding the midfoot with complex fluid in the extensor hallucis long tendon sheath suspicious for infectious tenosynovitis.  Patient was admitted for the management of osteomyelitis.  Transferred to [facility]. Orthopedics, ID consulted today   Assessment & Plan:   Principal Problem:   Acute osteomyelitis of right foot ([place]) Active Problems:   Hypertension   Arteriosclerotic cardiovascular disease (ASCVD)   Diabetes mellitus, type 2 ([place])   Diabetic foot ulcer ([place])   Acute osteomyelitis/chronic ulcer of right foot: Presented with chronic ulcer, pain, edema.  Hemodynamically stable on presentation, not septic.  X-ray was consistent with osteomyelitis showing likely destruction of fifth metatarsal, cuboid and navicular bones.  MRI consistent with osteomyelitis, infectious tenosynovitis. Orthopedics consulted,  ID  consulted.  Currently in vancomycin, cefepime.  Follow blood cultures. Elevated CRP  CKD stage IIIb: Currently kidney function at baseline.  Potassium of 5.2.  Will give a dose of Lokelma today.  Normocytic anemia: Likely associated with CKD.  Iron studies showed low normal iron  level  Chronic hyponatremia: Baseline sodium between 125-130.  Continue to monitor  History of peripheral vascular disease: Was following with podiatry, vascular surgery as an outpatient  Type 2 diabetes: On glipizide, metformin at home.  Continue sliding-scale insulin.  Hemoglobin A1c of 7.5  Hypertension: Continue lisinopril and metoprolol.  Monitor blood pressure  Obesity: BMI of 35         DVT prophylaxis:SCD Code Status: Full Family Communication: None at bedside Patient status:Inpatient  Dispo: The patient is from: Home              Anticipated d/c is [id number]               Anticipated d/c date is: Not sure  Consultants: Orthopedics, ID  Procedures: None yet  Antimicrobials:  Anti-infectives (From admission, onward)    Start     Dose/Rate Route Frequency Ordered Stop   03/12/21 1500  vancomycin (VANCOREADY) IVPB 1500 mg/300 mL       See Hyperspace for full Linked Orders Report.   1,500 mg 150 mL/hr over 120 Minutes Intravenous Every 24 hours 03/11/21 1441     03/11/21 1500  vancomycin (VANCOREADY) IVPB 2000 mg/400 mL       See Hyperspace for full Linked Orders Report.   2,000 mg 200 mL/hr over 120 Minutes Intravenous  Once 03/11/21 1441 03/11/21 1845   03/10/21 1615  vancomycin (VANCOCIN) IVPB 1000 mg/200 mL premix        1,000 mg 200 mL/hr over 60 Minutes Intravenous  Once 03/10/21 1609 03/10/21 2201       Subjective: Patient seen and examined at the bedside this morning.  Hemodynamically stable.  Overall looks comfortable.  Complains of pain on the right foot while  bearing  weight  Objective: Vitals:   03/11/21 1800 03/11/21 1830 03/11/21 2058 03/12/21 0536  BP: 128/65 133/64 (!) 127/55 (!) 123/58  Pulse:  66 67 64  Resp: 13 18 16 16   Temp:   98.4 F (36.9 C) 98.5 F (36.9 C)  TempSrc:   Oral Oral  SpO2:  94% 98% 90%  Weight:      Height:       No intake or output data in the 24 hours ending 03/12/21 0817 Filed Weights   03/10/21 1045   Weight: 96.6 kg    Examination:  General exam: Overall comfortable, not in distress,obese HEENT: PERRL Respiratory system:  no wheezes or crackles  Cardiovascular system: S1 & S2 heard, RRR.  Gastrointestinal system: Abdomen is nondistended, soft and nontender. Central nervous system: Alert and oriented Extremities: Edema, tenderness of the right foot Skin: No rashes, no ulcers,no icterus     Data Reviewed: I have personally reviewed following labs and imaging studies  CBC: Recent Labs  Lab 03/10/21 1242 03/11/21 0010  WBC 11.1* 8.4  NEUTROABS 8.5*  --   HGB 9.0* 8.9*  HCT 30.4* 28.5*  MCV 93.3 92.8  PLT 516* 0000000*   Basic Metabolic Panel: Recent Labs  Lab 03/10/21 1242 03/11/21 0010  NA 130* 129*  K 4.6 4.7  CL 94* 96*  CO2 23 26  GLUCOSE 155* 159*  BUN 23* 28*  CREATININE 1.47* 1.42*  CALCIUM 9.0 8.8*   GFR: Estimated Creatinine Clearance: 48.8 mL/min (A) (by C-G formula based on SCr of 1.42 mg/dL (H)). Liver Function Tests: Recent Labs  Lab 03/11/21 0010  AST 61*  ALT 53*  ALKPHOS 127*  BILITOT 0.3  PROT 7.8  ALBUMIN 2.2*   No results for input(s): LIPASE, AMYLASE in the last 168 hours. No results for input(s): AMMONIA in the last 168 hours. Coagulation Profile: No results for input(s): INR, PROTIME in the last 168 hours. Cardiac Enzymes: No results for input(s): CKTOTAL, CKMB, CKMBINDEX, TROPONINI in the last 168 hours. BNP (last 3 results) No results for input(s): PROBNP in the last 8760 hours. HbA1C: Recent Labs    03/11/21 0010  HGBA1C 7.5*   CBG: Recent Labs  Lab 03/11/21 0848 03/11/21 1332 03/11/21 1603 03/11/21 2358 03/12/21 0743  GLUCAP 123* 172* 171* 137* 150*   Lipid Profile: No results for input(s): CHOL, HDL, LDLCALC, TRIG, CHOLHDL, LDLDIRECT in the last 72 hours. Thyroid Function Tests: No results for input(s): TSH, T4TOTAL, FREET4, T3FREE, THYROIDAB in the last 72 hours. Anemia Panel: No results for input(s):  VITAMINB12, FOLATE, FERRITIN, TIBC, IRON, RETICCTPCT in the last 72 hours. Sepsis Labs: No results for input(s): PROCALCITON, LATICACIDVEN in the last 168 hours.  Recent Results (from the past 240 hour(s))  Resp Panel by RT-PCR (Flu A&B, Covid) Nasopharyngeal Swab     Status: None   Collection Time: 03/10/21  2:57 PM   Specimen: Nasopharyngeal Swab; Nasopharyngeal(NP) swabs in vial transport medium  Result Value Ref Range Status   SARS Coronavirus 2 by RT PCR NEGATIVE NEGATIVE Final    Comment: (NOTE) SARS-CoV-2 target nucleic acids are NOT DETECTED.  The SARS-CoV-2 RNA is generally detectable in upper respiratory specimens during the acute phase of infection. The lowest concentration of SARS-CoV-2 viral copies this assay can detect is 138 copies/mL. A negative result does not preclude SARS-Cov-2 infection and should not be used as the sole basis for treatment or other patient management decisions. A negative result may occur with  improper specimen collection/handling, submission of  specimen other than nasopharyngeal swab, presence of viral mutation(s) within the areas targeted by this assay, and inadequate number of viral copies(<138 copies/mL). A negative result must be combined with clinical observations, patient history, and epidemiological information. The expected result is Negative.  Fact Sheet for Patients:  EntrepreneurPulse.com.au  Fact Sheet for Healthcare Providers:  IncredibleEmployment.be  This test is no t yet approved or cleared by the [place] FDA and  has been authorized for detection and/or diagnosis of SARS-CoV-2 by FDA under an Emergency Use Authorization (EUA). This EUA will remain  in effect (meaning this test can be used) for the duration of the COVID-19 declaration under Section 564(b)(1) of the Act, 21 U.S.C.section 360bbb-3(b)(1), unless the authorization is terminated  or revoked sooner.       Influenza A  by PCR NEGATIVE NEGATIVE Final   Influenza B by PCR NEGATIVE NEGATIVE Final    Comment: (NOTE) The Xpert Xpress SARS-CoV-2/FLU/RSV plus assay is intended as an aid in the diagnosis of influenza from Nasopharyngeal swab specimens and should not be used as a sole basis for treatment. Nasal washings and aspirates are unacceptable for Xpert Xpress SARS-CoV-2/FLU/RSV testing.  Fact Sheet for Patients: EntrepreneurPulse.com.au  Fact Sheet for Healthcare Providers: IncredibleEmployment.be  This test is not yet approved or cleared by the [place] FDA and has been authorized for detection and/or diagnosis of SARS-CoV-2 by FDA under an Emergency Use Authorization (EUA). This EUA will remain in effect (meaning this test can be used) for the duration of the COVID-19 declaration under Section 564(b)(1) of the Act, 21 U.S.C. section 360bbb-3(b)(1), unless the authorization is terminated or revoked.  Performed at [facility], [address], [place], [place] [address]   Culture, blood (routine x 2)     Status: None (Preliminary result)   Collection Time: 03/11/21 12:10 AM   Specimen: Right Antecubital; Blood  Result Value Ref Range Status   Specimen Description RIGHT ANTECUBITAL  Final   Special Requests   Final    BOTTLES DRAWN AEROBIC AND ANAEROBIC Blood Culture adequate volume   Culture   Final    NO GROWTH 1 DAY Performed at [facility], [address], [place], [place] [address]    Report Status PENDING  Incomplete  Culture, blood (routine x 2)     Status: None (Preliminary result)   Collection Time: 03/11/21 12:10 AM   Specimen: BLOOD RIGHT FOREARM  Result Value Ref Range Status   Specimen Description BLOOD RIGHT FOREARM  Final   Special Requests   Final    BOTTLES DRAWN AEROBIC AND ANAEROBIC Blood Culture results may not be optimal due to an excessive volume of blood received in culture bottles   Culture   Final    NO GROWTH 1  DAY Performed at [facility], [address], [place],  [address]    Report Status PENDING  Incomplete         Radiology Studies: MR FOOT RIGHT WO CONTRAST  Result Date: 03/11/2021 CLINICAL DATA:  Diabetic with foot swelling. Osteomyelitis suspected. Previous amputations to 4th and 5th metatarsals. Open wounds. EXAM: MRI OF THE RIGHT FOREFOOT WITHOUT CONTRAST TECHNIQUE: Multiplanar, multisequence MR imaging of the right forefoot was performed. No intravenous contrast was administered. COMPARISON:  Right foot radiographs 03/10/2021 and 03/23/2013. FINDINGS: Bones/Joint/Cartilage Study includes the forefoot and midfoot. The hindfoot and ankle are not included. In correlation with prior radiographs, patient is status post remote amputation through the base of the 5th metatarsal and more recent amputation through  the mid 4th metatarsal. There is gross fragmentation, destruction and marrow T2 hyperintensity throughout the cuneiform bones, cuboid, navicular and bases of all the metatarsals which are posteriorly dislocated, findings most consistent with advanced neuropathic joint ([place]arc[place]) changes. 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 The marrow edema within the 3rd metatarsal extends into the distal shaft. The remaining toes are intact. There is nonspecific low-level marrow T2 hyperintensity within the visualized talar head and medial malleolus. Ligaments Fracture dislocation at the Lisfranc joint. The collateral ligaments of the remaining metatarsophalangeal joints appear grossly intact. Muscles and Tendons There is a large amount of complex fluid within the extensor hallucis longus tendon, suspicious for infectious tenosynovitis. No gross flexor tendon abnormality identified. Nonspecific T2 hyperintensity throughout the forefoot musculature. Soft tissues There is apparent soft tissue ulceration along  the plantar aspect of the remaining 4th metatarsal, and there is a complex fluid collection surrounding the 4th metatarsal, measuring up to 3.3 x 2.0 cm transverse on image 32/11. Multiple other complex fluid collections are present within the midfoot, most notable medially. No other discrete areas of skin ulceration are identified. IMPRESSION: 1. 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 2. In the setting of advanced neuropathic changes, assessment for osteomyelitis is limited. However, given apparent skin ulceration along the plantar aspect of the remaining distal 4th metatarsal and adjacent complex fluid, the findings are suspicious for superimposed osteomyelitis. Of note, the proximal extent is difficult to assess on this examination of the forefoot. If additional amputation is planned, consider additional examination of the hindfoot/ankle. 3. Multiple complex fluid collections surrounding the midfoot with complex fluid in the extensor hallucis longus tendon sheath, suspicious for infectious tenosynovitis. Electronically Signed   By: Richardean Sale M.D.   On: 03/11/2021 08:52   US ARTERIAL ABI (SCREENING LOWER EXTREMITY)  Result Date: 03/11/2021 CLINICAL DATA:  Right plantar foot wound x1 year, redness and edema x1 month, previous fourth and fifth toe amputation, previous fem-pop bypass. EXAM: NONINVASIVE PHYSIOLOGIC VASCULAR STUDY OF BILATERAL LOWER EXTREMITIES TECHNIQUE: Evaluation of both lower extremities were performed at rest, including calculation of ankle-brachial indices with single level Doppler, pressure and pulse volume recording. COMPARISON:  01/24/2014 FINDINGS: Right ABI:  0.72 (previously 0.87) Left ABI:  0.50 (previously 0.36) Right Lower Extremity:  Multiphasic arterial waveforms at the ankle. Left Lower Extremity:  Monophasic arterial waveforms at the ankle. IMPRESSION: 1. Moderately severe bilateral lower extremity arterial occlusive disease at  rest, slightly worse on the right and improved on the left since prior study. Electronically Signed   By: Lucrezia Europe M.D.   On: 03/11/2021 10:46   DG Foot Complete Right  Result Date: 03/10/2021 CLINICAL DATA:  Diabetic foot wound without known injury. EXAM: RIGHT FOOT COMPLETE - 3+ VIEW COMPARISON:  March 23, 2013. FINDINGS: Status post amputation involving the fourth and fifth metatarsals and phalanges. Diffuse soft tissue swelling is noted concerning for cellulitis. Lytic destruction involving the proximal portion of the first metatarsal is noted with pathologic fracture. Lytic destruction is also seen involving the cuboid bone with pathologic fracture. Lytic destruction of the navicular bone is also noted. Dislocation of the second and third metatarsals is noted as well. IMPRESSION: Lytic destruction is seen involving the first metatarsal, cuboid and navicular bones consistent with osteomyelitis. Status post amputation of the fourth and fifth metatarsals and phalanges is noted. Electronically Signed   By: Bobbe Medico.D.  On: 03/10/2021 12:56        Scheduled Meds:  aspirin EC  81 mg Oral Daily   atorvastatin  20 mg Oral QPM   citalopram  20 mg Oral Daily   insulin aspart  0-9 Units Subcutaneous Q4H   lisinopril  10 mg Oral Daily   metoprolol tartrate  25 mg Oral BID   Continuous Infusions:  vancomycin       LOS: 2 days         Shelly Coss, MD [facility] P1/25/2023, 8:17 AM

## 2021-03-12 NOTE — Progress Notes (Signed)
RLE arterial duplex has been completed.   Results can be found under chart review under CV PROC. 03/12/2021 4:58 PM Finian Helvey RVT, RDMS

## 2021-03-12 NOTE — Consult Note (Signed)
WOC Nurse Consult Note: Patient receiving care in [facility] 5N19. Consult completed remotely. Reason for Consult: diabetic ulcer-rt foot Wound type: per CT scan results from yesterday, the right foot has changes that are suspicious for osteomyelitis.  Pressure Injury POA: Yes/No/NA Measurement: Wound bed: see photos of wound Drainage (amount, consistency, odor) see photos Periwound: Dressing procedure/placement/frequency: Place dry gauze over right plantar foot wound, secure with kerlix.  PLEASE NOTE: treatment of osteomyelitis exceeds the scope of practice of the WOC nurse. Medical provider, please consult specialty of your choice such as podiatry, orthopedics, or vascular services in the care and treatment of this patient.  WOC nurse will not follow at this time.  Please re-consult the WOC team if needed.  Helmut Muster, RN, MSN, CWOCN, CNS-BC, pager [phone]

## 2021-03-12 NOTE — Progress Notes (Signed)
Pharmacy Antibiotic Note  Christy [name] is a 61 y.o. female admitted on 03/10/2021 with  Osteomyelitis .  Pharmacy has been consulted for cefepime dosing. MRI of right foots shows advanced neuropathic foot, finding consistent with osteomyelitis.  Plan: Start Cefepime 2 grams IV every 12 hours Continue vancomycin per pharmacy started 1/24 Monitor clinical progress, cultures/sensitivities, renal function, abx plan Vancomycin levels as indicated F/u ID recommendations   Height: 5' 5.4" (166.1 cm) Weight: 96.6 kg (213 lb) IBW/kg (Calculated) : 57.92  Temp (24hrs), Avg:98.5 F (36.9 C), Min:98.4 F (36.9 C), Max:98.5 F (36.9 C)  Recent Labs  Lab 03/10/21 1242 03/11/21 0010  WBC 11.1* 8.4  CREATININE 1.47* 1.42*    Estimated Creatinine Clearance: 48.8 mL/min (A) (by C-G formula based on SCr of 1.42 mg/dL (H)).    Allergies  Allergen Reactions   Bactrim Diarrhea   Isosorbide Mononitrate Other (See Comments)    Made patient feel as though she was about to have another MI   Keflex [Cephalexin] Other (See Comments)    Yeast infection    Antimicrobials this admission: 1/24 vancomycin >>  1/25 cefepime >>   Dose adjustments this admission:   Microbiology results: 1/24 BCx: ng x 1d 1/23 CovFlu: negative   Thank you for allowing [place] to participate in this patients care. Signe Colt, PharmD 03/12/2021 9:02 AM  **Pharmacist phone directory can be found on [email or web address] listed under [facility]**

## 2021-03-13 DIAGNOSIS — M869 Osteomyelitis, unspecified: Secondary | ICD-10-CM | POA: Diagnosis not present

## 2021-03-13 LAB — CBC WITH DIFFERENTIAL/PLATELET
Abs Immature Granulocytes: 0.32 10*3/uL — ABNORMAL HIGH (ref 0.00–0.07)
Basophils Absolute: 0.1 10*3/uL (ref 0.0–0.1)
Basophils Relative: 1 %
Eosinophils Absolute: 0.1 10*3/uL (ref 0.0–0.5)
Eosinophils Relative: 1 %
HCT: 28.2 % — ABNORMAL LOW (ref 36.0–46.0)
Hemoglobin: 8.8 g/dL — ABNORMAL LOW (ref 12.0–15.0)
Immature Granulocytes: 4 %
Lymphocytes Relative: 20 %
Lymphs Abs: 1.7 10*3/uL (ref 0.7–4.0)
MCH: 28.2 pg (ref 26.0–34.0)
MCHC: 31.2 g/dL (ref 30.0–36.0)
MCV: 90.4 fL (ref 80.0–100.0)
Monocytes Absolute: 0.7 10*3/uL (ref 0.1–1.0)
Monocytes Relative: 8 %
Neutro Abs: 5.5 10*3/uL (ref 1.7–7.7)
Neutrophils Relative %: 66 %
Platelets: 443 10*3/uL — ABNORMAL HIGH (ref 150–400)
RBC: 3.12 MIL/uL — ABNORMAL LOW (ref 3.87–5.11)
RDW: 15.9 % — ABNORMAL HIGH (ref 11.5–15.5)
WBC: 8.3 10*3/uL (ref 4.0–10.5)
nRBC: 0 % (ref 0.0–0.2)

## 2021-03-13 LAB — GLUCOSE, CAPILLARY
Glucose-Capillary: 167 mg/dL — ABNORMAL HIGH (ref 70–99)
Glucose-Capillary: 164 mg/dL — ABNORMAL HIGH (ref 70–99)
Glucose-Capillary: 179 mg/dL — ABNORMAL HIGH (ref 70–99)

## 2021-03-13 LAB — BASIC METABOLIC PANEL
Anion gap: 8 (ref 5–15)
BUN: 21 mg/dL — ABNORMAL HIGH (ref 6–20)
CO2: 23 mmol/L (ref 22–32)
Calcium: 8.7 mg/dL — ABNORMAL LOW (ref 8.9–10.3)
Chloride: 99 mmol/L (ref 98–111)
Creatinine, Ser: 1.24 mg/dL — ABNORMAL HIGH (ref 0.44–1.00)
GFR, Estimated: 50 mL/min — ABNORMAL LOW (ref >60)
Glucose, Bld: 177 mg/dL — ABNORMAL HIGH (ref 70–99)
Potassium: 5.2 mmol/L — ABNORMAL HIGH (ref 3.5–5.1)
Sodium: 130 mmol/L — ABNORMAL LOW (ref 135–145)

## 2021-03-13 MED ORDER — SODIUM ZIRCONIUM CYCLOSILICATE 10 G PO PACK
10.0000 g | PACK | Freq: Once | ORAL | Status: AC
  Administered 2021-03-13: 10 g via ORAL
  Filled 2021-03-13: qty 1

## 2021-03-13 NOTE — Progress Notes (Signed)
[facility] [place]     Objective (!) 103/30 66 98.6 F (37 C) (Oral) 16 94%  Intake/Output Summary (Last 24 hours) at 03/13/2021 0654 Last data filed at 03/12/2021 1500 Gross per 24 hour  Intake 480 ml  Output --  Net 480 ml     +-----------+--------+-----+--------+----------+--------+   RIGHT       PSV cm/s Ratio Stenosis Waveform   Comments   +-----------+--------+-----+--------+----------+--------+   CFA Prox    168                     biphasic              +-----------+--------+-----+--------+----------+--------+   DFA         76                      monophasic            +-----------+--------+-----+--------+----------+--------+   SFA Prox    46                      monophasic            +-----------+--------+-----+--------+----------+--------+   SFA Mid                    occluded                       +-----------+--------+-----+--------+----------+--------+   POP Prox                   occluded                       +-----------+--------+-----+--------+----------+--------+   TP Trunk    118                     monophasic            +-----------+--------+-----+--------+----------+--------+   ATA Prox    37                      monophasic            +-----------+--------+-----+--------+----------+--------+   ATA Distal  29                      monophasic            +-----------+--------+-----+--------+----------+--------+   PTA Prox                   occluded                       +-----------+--------+-----+--------+----------+--------+   PERO Prox   64                      monophasic            +-----------+--------+-----+--------+----------+--------+   PERO Distal 67                      monophasic            +-----------+--------+-----+--------+----------+--------+        Right Graft #1:  +------------------+--------+---------------+----------+--------+                      PSV cm/s Stenosis        Waveform   Comments    +------------------+--------+---------------+----------+--------+  Inflow             154                      biphasic              +------------------+--------+---------------+----------+--------+   Prox Anastomosis   262      50-70% stenosis monophasic            +------------------+--------+---------------+----------+--------+   Proximal Graft     55                       monophasic            +------------------+--------+---------------+----------+--------+   Mid Graft          46                       monophasic            +------------------+--------+---------------+----------+--------+   Distal Graft       34                       monophasic            +------------------+--------+---------------+----------+--------+   Distal Anastomosis 71                       monophasic            +------------------+--------+---------------+----------+--------+   Outflow            135                      monophasic            +------------------+--------+---------------+----------+--------+    Summary:  Right: Chronic occlusion noted in SFA and PTA that was seen on previous  exams. Stenosis noted in area of proximal anastomosis of bypass graft.      Assessment/Planning: PAD s/p right LE fem to BK pop bypass now with plantar 4th MT ulcer with osteomyelitis  Her bypass graft remains patent with with evidence of proximal stenosis 50-70 percent.   Plan on 03/12/21 was for BKA  The stenosis may need to be address as well to heal the BKA.  I will place NPO orders and once the findings are reviewed we will place consent orders by Dr. Myra Gianotti.      Christy [name] 03/13/2021 6:54 AM --  Laboratory Lab Results: Recent Labs    03/12/21 0852 03/13/21 0311  WBC 7.5 8.3  HGB 8.8* 8.8*  HCT 27.7* 28.2*  PLT 473* 443*   BMET Recent Labs    03/12/21 0852 03/13/21 0311  NA 128* 130*  K 5.2* 5.2*  CL 94* 99  CO2 25 23  GLUCOSE 208* 177*  BUN 21* 21*  CREATININE 1.26* 1.24*   CALCIUM 9.1 8.7*    COAG Lab Results  Component Value Date   INR 0.99 10/13/2013   INR 1.08 10/01/2010   No results found for: PTT

## 2021-03-13 NOTE — Progress Notes (Signed)
° ° °  Subjective  -   No complaints this morning   Physical Exam:  Drainage on her right foot dressing       Assessment/Plan:    Right foot osteomyelitis: I again discussed with the patient that I do not think she has a salvageable situation.  Arterial duplex shows a patent bypass graft with elevated velocities.  I discussed proceeding with a below-knee amputation tomorrow.  She will need to be n.p.o. after midnight.  All questions were answered.  Christy [name] 03/13/2021 2:53 PM --  Vitals:   03/13/21 1034 03/13/21 1301  BP: (!) 105/51 114/65  Pulse: 60 60  Resp: 17 18  Temp: 97.6 F (36.4 C) 98 F (36.7 C)  SpO2: 97% 96%    Intake/Output Summary (Last 24 hours) at 03/13/2021 1453 Last data filed at 03/12/2021 1500 Gross per 24 hour  Intake 240 ml  Output --  Net 240 ml     Laboratory CBC    Component Value Date/Time   WBC 8.3 03/13/2021 0311   HGB 8.8 (L) 03/13/2021 0311   HCT 28.2 (L) 03/13/2021 0311   PLT 443 (H) 03/13/2021 0311    BMET    Component Value Date/Time   NA 130 (L) 03/13/2021 0311   K 5.2 (H) 03/13/2021 0311   CL 99 03/13/2021 0311   CO2 23 03/13/2021 0311   GLUCOSE 177 (H) 03/13/2021 0311   BUN 21 (H) 03/13/2021 0311   CREATININE 1.24 (H) 03/13/2021 0311   CREATININE 1.16 (H) 07/14/2011 0921   CALCIUM 8.7 (L) 03/13/2021 0311   GFRNONAA 50 (L) 03/13/2021 0311   GFRAA 59 (L) 10/20/2013 0312    COAG Lab Results  Component Value Date   INR 0.99 10/13/2013   INR 1.08 10/01/2010   No results found for: PTT  Antibiotics Anti-infectives (From admission, onward)    Start     Dose/Rate Route Frequency Ordered Stop   03/12/21 1500  vancomycin (VANCOREADY) IVPB 1500 mg/300 mL       See Hyperspace for full Linked Orders Report.   1,500 mg 150 mL/hr over 120 Minutes Intravenous Every 24 hours 03/11/21 1441     03/12/21 1000  ceFEPIme (MAXIPIME) 2 g in sodium chloride 0.9 % 100 mL IVPB        2 g 200 mL/hr over 30 Minutes  Intravenous Every 12 hours 03/12/21 0830     03/11/21 1500  vancomycin (VANCOREADY) IVPB 2000 mg/400 mL       See Hyperspace for full Linked Orders Report.   2,000 mg 200 mL/hr over 120 Minutes Intravenous  Once 03/11/21 1441 03/11/21 1845   03/10/21 1615  vancomycin (VANCOCIN) IVPB 1000 mg/200 mL premix        1,000 mg 200 mL/hr over 60 Minutes Intravenous  Once 03/10/21 1609 03/10/21 2201        V. Charlena Cross, M.D., [facility] [place] Office: [phone] Pager:  [phone]

## 2021-03-13 NOTE — Progress Notes (Signed)
PROGRESS NOTE    Christy [name]  [id number] DOB: Sep 28, 1960 DOA: 03/10/2021 PCP: Wanita Chamberlain, PA-C   Chief Complain: Ulcer on the right foot, swelling  Brief Narrative: Patient is a 61 year old female with history of peripheral vascular disease with amputation of right fourth and fifth metatarsal, phalanges, diabetes type 2 hypertension, CKD stage IIIb who presented to the emergency department at [facility] with complaints of ulcer on the right foot and swelling.  She was following up with wound care.  She was complaining of pain with weightbearing.  X-ray of the right foot showed lytic destruction involving the fifth metatarsal cuboid and navicular bone consistent with osteomyelitis.  MRI showed advanced neuropathic foot consistent with osteomyelitis, developed complex fluid collection surrounding the midfoot with complex fluid in the extensor hallucis long tendon sheath suspicious for infectious tenosynovitis.  Patient was admitted for the management of osteomyelitis.  Transferred to [facility]. Orthopedics,vascular surgery , ID consulted .Plan for right BKA tomorrow   Assessment & Plan:   Principal Problem:   Osteomyelitis of right foot ([place]) Active Problems:   Hypertension   Arteriosclerotic cardiovascular disease (ASCVD)   Diabetes mellitus, type 2 ([place])   Diabetic foot ulcer ([place])   Acute osteomyelitis/chronic ulcer of right foot: Presented with chronic right foot  ulcer, pain, edema.  Hemodynamically stable on presentation, not septic.  X-ray was consistent with osteomyelitis showing likely destruction of fifth metatarsal, cuboid and navicular bones.  MRI consistent with osteomyelitis, infectious tenosynovitis,charcot  foot.  Currently in vancomycin, cefepime.  Follow blood cultures,NGTD Elevated CRP. Plan for right BKA tomorrow.  History of peripheral vascular disease: Was following with podiatry, vascular surgery as an outpatient.  Lower extremity arterial duplex of  the right showed chronic occlusion noted in SFA and PTA that was seen on previous  exams. Stenosis noted in area of proximal anastomosis of bypass graft.   CKD stage IIIb: Currently kidney function at baseline.  Potassium of 5.2.  Will give a dose of Lokelma today.  Normocytic anemia: Likely associated with CKD.  Iron studies showed low normal iron level  Chronic hyponatremia: Baseline sodium between 125-130.  Continue to monitor  Type 2 diabetes: On glipizide, metformin at home.  Continue sliding-scale insulin.  Hemoglobin A1c of 7.5  Hypertension: Continue lisinopril and metoprolol.  Monitor blood pressure  Obesity: BMI of 35         DVT prophylaxis:SCD Code Status: Full Family Communication: None at bedside Patient status:Inpatient  Dispo: The patient is from: Home              Anticipated d/c is [id number]               Anticipated d/c date is: Not sure  Consultants: Orthopedics, ID  Procedures: None yet  Antimicrobials:  Anti-infectives (From admission, onward)    Start     Dose/Rate Route Frequency Ordered Stop   03/12/21 1500  vancomycin (VANCOREADY) IVPB 1500 mg/300 mL       See Hyperspace for full Linked Orders Report.   1,500 mg 150 mL/hr over 120 Minutes Intravenous Every 24 hours 03/11/21 1441     03/12/21 1000  ceFEPIme (MAXIPIME) 2 g in sodium chloride 0.9 % 100 mL IVPB        2 g 200 mL/hr over 30 Minutes Intravenous Every 12 hours 03/12/21 0830     03/11/21 1500  vancomycin (VANCOREADY) IVPB 2000 mg/400 mL       See Hyperspace for full Linked Orders Report.  2,000 mg 200 mL/hr over 120 Minutes Intravenous  Once 03/11/21 1441 03/11/21 1845   03/10/21 1615  vancomycin (VANCOCIN) IVPB 1000 mg/200 mL premix        1,000 mg 200 mL/hr over 60 Minutes Intravenous  Once 03/10/21 1609 03/10/21 2201       Subjective: Patient seen and examined the bedside this morning.  Hemodynamically stable.  Overall comfortable.  No new complaints  today  Objective: Vitals:   03/12/21 2204 03/13/21 0528 03/13/21 1034 03/13/21 1301  BP: (!) 126/55 (!) 103/30 (!) 105/51 114/65  Pulse: 65 66 60 60  Resp: 16 16 17 18   Temp: 98.4 F (36.9 C) 98.6 F (37 C) 97.6 F (36.4 C) 98 F (36.7 C)  TempSrc: Oral Oral Oral Oral  SpO2: 91% 94% 97% 96%  Weight:      Height:        Intake/Output Summary (Last 24 hours) at 03/13/2021 1354 Last data filed at 03/12/2021 1500 Gross per 24 hour  Intake 240 ml  Output --  Net 240 ml   Filed Weights   03/10/21 1045  Weight: 96.6 kg    Examination:   General exam: Overall comfortable, not in distress,obese HEENT: PERRL Respiratory system:  no wheezes or crackles  Cardiovascular system: S1 & S2 heard, RRR.  Gastrointestinal system: Abdomen is nondistended, soft and nontender. Central nervous system: Alert and oriented Extremities: Edema of the right foot, tenderness, ulcer on the sole of right foot Skin: No rashes, no ulcers,no icterus    Data Reviewed: I have personally reviewed following labs and imaging studies  CBC: Recent Labs  Lab 03/10/21 1242 03/11/21 0010 03/12/21 0852 03/13/21 0311  WBC 11.1* 8.4 7.5 8.3  NEUTROABS 8.5*  --  4.9 5.5  HGB 9.0* 8.9* 8.8* 8.8*  HCT 30.4* 28.5* 27.7* 28.2*  MCV 93.3 92.8 89.9 90.4  PLT 516* 514* 473* [id number]*   Basic Metabolic Panel: Recent Labs  Lab 03/10/21 1242 03/11/21 0010 03/12/21 0852 03/13/21 0311  NA 130* 129* 128* 130*  K 4.6 4.7 5.2* 5.2*  CL 94* 96* 94* 99  CO2 23 26 25 23   GLUCOSE 155* 159* 208* 177*  BUN 23* 28* 21* 21*  CREATININE 1.47* 1.42* 1.26* 1.24*  CALCIUM 9.0 8.8* 9.1 8.7*   GFR: Estimated Creatinine Clearance: 55.9 mL/min (A) (by C-G formula based on SCr of 1.24 mg/dL (H)). Liver Function Tests: Recent Labs  Lab 03/11/21 0010  AST 61*  ALT 53*  ALKPHOS 127*  BILITOT 0.3  PROT 7.8  ALBUMIN 2.2*   No results for input(s): LIPASE, AMYLASE in the last 168 hours. No results for input(s): AMMONIA in  the last 168 hours. Coagulation Profile: No results for input(s): INR, PROTIME in the last 168 hours. Cardiac Enzymes: No results for input(s): CKTOTAL, CKMB, CKMBINDEX, TROPONINI in the last 168 hours. BNP (last 3 results) No results for input(s): PROBNP in the last 8760 hours. HbA1C: Recent Labs    03/11/21 0010  HGBA1C 7.5*   CBG: Recent Labs  Lab 03/12/21 0743 03/12/21 1201 03/12/21 1706 03/12/21 2205 03/13/21 1129  GLUCAP 150* 156* 151* 154* 167*   Lipid Profile: No results for input(s): CHOL, HDL, LDLCALC, TRIG, CHOLHDL, LDLDIRECT in the last 72 hours. Thyroid Function Tests: No results for input(s): TSH, T4TOTAL, FREET4, T3FREE, THYROIDAB in the last 72 hours. Anemia Panel: Recent Labs    03/12/21 0852  FERRITIN 158  TIBC 301  IRON 28   Sepsis Labs: No results for input(s): PROCALCITON,  LATICACIDVEN in the last 168 hours.  Recent Results (from the past 240 hour(s))  Resp Panel by RT-PCR (Flu A&B, Covid) Nasopharyngeal Swab     Status: None   Collection Time: 03/10/21  2:57 PM   Specimen: Nasopharyngeal Swab; Nasopharyngeal(NP) swabs in vial transport medium  Result Value Ref Range Status   SARS Coronavirus 2 by RT PCR NEGATIVE NEGATIVE Final    Comment: (NOTE) SARS-CoV-2 target nucleic acids are NOT DETECTED.  The SARS-CoV-2 RNA is generally detectable in upper respiratory specimens during the acute phase of infection. The lowest concentration of SARS-CoV-2 viral copies this assay can detect is 138 copies/mL. A negative result does not preclude SARS-Cov-2 infection and should not be used as the sole basis for treatment or other patient management decisions. A negative result may occur with  improper specimen collection/handling, submission of specimen other than nasopharyngeal swab, presence of viral mutation(s) within the areas targeted by this assay, and inadequate number of viral copies(<138 copies/mL). A negative result must be combined  with clinical observations, patient history, and epidemiological information. The expected result is Negative.  Fact Sheet for Patients:  EntrepreneurPulse.com.au  Fact Sheet for Healthcare Providers:  IncredibleEmployment.be  This test is no t yet approved or cleared by the [place] FDA and  has been authorized for detection and/or diagnosis of SARS-CoV-2 by FDA under an Emergency Use Authorization (EUA). This EUA will remain  in effect (meaning this test can be used) for the duration of the COVID-19 declaration under Section 564(b)(1) of the Act, 21 U.S.C.section 360bbb-3(b)(1), unless the authorization is terminated  or revoked sooner.       Influenza A by PCR NEGATIVE NEGATIVE Final   Influenza B by PCR NEGATIVE NEGATIVE Final    Comment: (NOTE) The Xpert Xpress SARS-CoV-2/FLU/RSV plus assay is intended as an aid in the diagnosis of influenza from Nasopharyngeal swab specimens and should not be used as a sole basis for treatment. Nasal washings and aspirates are unacceptable for Xpert Xpress SARS-CoV-2/FLU/RSV testing.  Fact Sheet for Patients: EntrepreneurPulse.com.au  Fact Sheet for Healthcare Providers: IncredibleEmployment.be  This test is not yet approved or cleared by the [place] FDA and has been authorized for detection and/or diagnosis of SARS-CoV-2 by FDA under an Emergency Use Authorization (EUA). This EUA will remain in effect (meaning this test can be used) for the duration of the COVID-19 declaration under Section 564(b)(1) of the Act, 21 U.S.C. section 360bbb-3(b)(1), unless the authorization is terminated or revoked.  Performed at [facility], [address] [place], [place] [address]   Culture, blood (routine x 2)     Status: None (Preliminary result)   Collection Time: 03/11/21 12:10 AM   Specimen: Right Antecubital; Blood  Result Value Ref Range Status   Specimen  Description RIGHT ANTECUBITAL  Final   Special Requests   Final    BOTTLES DRAWN AEROBIC AND ANAEROBIC Blood Culture adequate volume   Culture   Final    NO GROWTH 2 DAYS Performed at [facility], [address], [place], [place] [address]    Report Status PENDING  Incomplete  Culture, blood (routine x 2)     Status: None (Preliminary result)   Collection Time: 03/11/21 12:10 AM   Specimen: BLOOD RIGHT FOREARM  Result Value Ref Range Status   Specimen Description BLOOD RIGHT FOREARM  Final   Special Requests   Final    BOTTLES DRAWN AEROBIC AND ANAEROBIC Blood Culture results may not be optimal due to an excessive volume  of blood received in culture bottles   Culture   Final    NO GROWTH 2 DAYS Performed at [facility], [address], [place], [place] [address]    Report Status PENDING  Incomplete  MRSA Next Gen by PCR, Nasal     Status: None   Collection Time: 03/12/21 11:15 AM   Specimen: Nasal Mucosa; Nasal Swab  Result Value Ref Range Status   MRSA by PCR Next Gen NOT DETECTED NOT DETECTED Final    Comment: (NOTE) The GeneXpert MRSA Assay (FDA approved for NASAL specimens only), is one component of a comprehensive MRSA colonization surveillance program. It is not intended to diagnose MRSA infection nor to guide or monitor treatment for MRSA infections. Test performance is not FDA approved in patients less than 49 years old. Performed at [facility], [place] [address], [place], [place] [address]          Radiology Studies: VAS [place] LOWER EXTREMITY ARTERIAL DUPLEX  Result Date: 03/12/2021 LOWER EXTREMITY ARTERIAL DUPLEX STUDY Patient Name:  Christy [name]  Date of Exam:   03/12/2021 Medical Rec #: [id number]       Accession #:    [id number] Date of Birth: Aug 28, 1960       Patient Gender: F Patient Age:   33 years Exam Location:  [facility] Procedure:      VAS [place] LOWER EXTREMITY ARTERIAL DUPLEX Referring Phys: Karoline Caldwell  --------------------------------------------------------------------------------  Indications: Peripheral artery disease. High Risk Factors: Hypertension, hyperlipidemia, Diabetes, current smoker,                    coronary artery disease.  Vascular Interventions: RLE fem-pop bypass 10/2013. Current ABI:            RLE 0.72                         LLE 0.50 Comparison Study: Previous arterial duplex was 01/24/14 - unable to access                   results. Performing Technologist: Rogelia Rohrer RVT, RDMS  Examination Guidelines: A complete evaluation includes B-mode imaging, spectral Doppler, color Doppler, and power Doppler as needed of all accessible portions of each vessel. Bilateral testing is considered an integral part of a complete examination. Limited examinations for reoccurring indications may be performed as noted.  +-----------+--------+-----+--------+----------+--------+  RIGHT       PSV cm/s Ratio Stenosis Waveform   Comments  +-----------+--------+-----+--------+----------+--------+  CFA Prox    168                     biphasic             +-----------+--------+-----+--------+----------+--------+  DFA         76                      monophasic           +-----------+--------+-----+--------+----------+--------+  SFA Prox    46                      monophasic           +-----------+--------+-----+--------+----------+--------+  SFA Mid                    occluded                      +-----------+--------+-----+--------+----------+--------+  POP Prox                   occluded                      +-----------+--------+-----+--------+----------+--------+  TP Trunk    118                     monophasic           +-----------+--------+-----+--------+----------+--------+  ATA Prox    37                      monophasic           +-----------+--------+-----+--------+----------+--------+  ATA Distal  29                      monophasic           +-----------+--------+-----+--------+----------+--------+  PTA Prox                    occluded                      +-----------+--------+-----+--------+----------+--------+  PERO Prox   64                      monophasic           +-----------+--------+-----+--------+----------+--------+  PERO Distal 67                      monophasic           +-----------+--------+-----+--------+----------+--------+  Right Graft #1: +------------------+--------+---------------+----------+--------+                     PSV cm/s Stenosis        Waveform   Comments  +------------------+--------+---------------+----------+--------+  Inflow             154                      biphasic             +------------------+--------+---------------+----------+--------+  Prox Anastomosis   262      50-70% stenosis monophasic           +------------------+--------+---------------+----------+--------+  Proximal Graft     55                       monophasic           +------------------+--------+---------------+----------+--------+  Mid Graft          46                       monophasic           +------------------+--------+---------------+----------+--------+  Distal Graft       34                       monophasic           +------------------+--------+---------------+----------+--------+  Distal Anastomosis 71                       monophasic           +------------------+--------+---------------+----------+--------+  Outflow            135  monophasic           +------------------+--------+---------------+----------+--------+   Summary: Right: Chronic occlusion noted in SFA and PTA that was seen on previous exams. Stenosis noted in area of proximal anastomosis of bypass graft.  See table(s) above for measurements and observations. Electronically signed by Harold Barban MD on 03/12/2021 at 9:17:30 PM.    Final         Scheduled Meds:  aspirin EC  81 mg Oral Daily   atorvastatin  20 mg Oral QPM   citalopram  20 mg Oral Daily   insulin aspart  0-5 Units Subcutaneous QHS   insulin aspart   0-9 Units Subcutaneous TID WC   lisinopril  10 mg Oral Daily   metoprolol tartrate  25 mg Oral BID   Continuous Infusions:  ceFEPime (MAXIPIME) IV 2 g (03/13/21 0816)   vancomycin 1,500 mg (03/13/21 0902)     LOS: 3 days         Shelly Coss, MD [facility] P1/26/2023, 1:54 PM

## 2021-03-14 ENCOUNTER — Encounter (HOSPITAL_COMMUNITY): Payer: Self-pay | Admitting: Internal Medicine

## 2021-03-14 ENCOUNTER — Encounter (HOSPITAL_COMMUNITY)
Admission: EM | Disposition: A | Discharge status: Home Health | Payer: Self-pay | Source: Home / Self Care | Attending: Internal Medicine

## 2021-03-14 ENCOUNTER — Inpatient Hospital Stay (HOSPITAL_COMMUNITY): Payer: 59 | Admitting: Anesthesiology

## 2021-03-14 DIAGNOSIS — E1169 Type 2 diabetes mellitus with other specified complication: Secondary | ICD-10-CM

## 2021-03-14 DIAGNOSIS — M869 Osteomyelitis, unspecified: Secondary | ICD-10-CM

## 2021-03-14 HISTORY — PX: MINOR APPLICATION OF WOUND VAC: SHX6243

## 2021-03-14 HISTORY — PX: AMPUTATION: SHX166

## 2021-03-14 LAB — CBC WITH DIFFERENTIAL/PLATELET
Abs Immature Granulocytes: 0.4 10*3/uL — ABNORMAL HIGH (ref 0.00–0.07)
Basophils Absolute: 0.1 10*3/uL (ref 0.0–0.1)
Basophils Relative: 1 %
Eosinophils Absolute: 0.2 10*3/uL (ref 0.0–0.5)
Eosinophils Relative: 2 %
HCT: 27.3 % — ABNORMAL LOW (ref 36.0–46.0)
Hemoglobin: 8.3 g/dL — ABNORMAL LOW (ref 12.0–15.0)
Immature Granulocytes: 5 %
Lymphocytes Relative: 26 %
Lymphs Abs: 2 10*3/uL (ref 0.7–4.0)
MCH: 27.7 pg (ref 26.0–34.0)
MCHC: 30.4 g/dL (ref 30.0–36.0)
MCV: 91 fL (ref 80.0–100.0)
Monocytes Absolute: 0.6 10*3/uL (ref 0.1–1.0)
Monocytes Relative: 8 %
Neutro Abs: 4.6 10*3/uL (ref 1.7–7.7)
Neutrophils Relative %: 58 %
Platelets: 418 10*3/uL — ABNORMAL HIGH (ref 150–400)
RBC: 3 MIL/uL — ABNORMAL LOW (ref 3.87–5.11)
RDW: 15.9 % — ABNORMAL HIGH (ref 11.5–15.5)
WBC: 7.8 10*3/uL (ref 4.0–10.5)
nRBC: 0 % (ref 0.0–0.2)

## 2021-03-14 LAB — BASIC METABOLIC PANEL
Anion gap: 9 (ref 5–15)
BUN: 25 mg/dL — ABNORMAL HIGH (ref 6–20)
CO2: 23 mmol/L (ref 22–32)
Calcium: 8.8 mg/dL — ABNORMAL LOW (ref 8.9–10.3)
Chloride: 99 mmol/L (ref 98–111)
Creatinine, Ser: 1.21 mg/dL — ABNORMAL HIGH (ref 0.44–1.00)
GFR, Estimated: 51 mL/min — ABNORMAL LOW (ref >60)
Glucose, Bld: 171 mg/dL — ABNORMAL HIGH (ref 70–99)
Potassium: 4.5 mmol/L (ref 3.5–5.1)
Sodium: 131 mmol/L — ABNORMAL LOW (ref 135–145)

## 2021-03-14 LAB — GLUCOSE, CAPILLARY
Glucose-Capillary: 121 mg/dL — ABNORMAL HIGH (ref 70–99)
Glucose-Capillary: 147 mg/dL — ABNORMAL HIGH (ref 70–99)
Glucose-Capillary: 149 mg/dL — ABNORMAL HIGH (ref 70–99)
Glucose-Capillary: 169 mg/dL — ABNORMAL HIGH (ref 70–99)
Glucose-Capillary: 224 mg/dL — ABNORMAL HIGH (ref 70–99)
Glucose-Capillary: 365 mg/dL — ABNORMAL HIGH (ref 70–99)
Glucose-Capillary: 419 mg/dL — ABNORMAL HIGH (ref 70–99)

## 2021-03-14 SURGERY — AMPUTATION BELOW KNEE
Anesthesia: Regional | Site: Leg Lower | Laterality: Right

## 2021-03-14 MED ORDER — PANTOPRAZOLE SODIUM 40 MG PO TBEC
40.0000 mg | DELAYED_RELEASE_TABLET | Freq: Every day | ORAL | Status: DC
  Administered 2021-03-15 – 2021-03-16 (×2): 40 mg via ORAL
  Filled 2021-03-14 (×2): qty 1

## 2021-03-14 MED ORDER — ONDANSETRON HCL 4 MG/2ML IJ SOLN: 4.0000 mg | Freq: Once | INTRAMUSCULAR | Status: DC | PRN

## 2021-03-14 MED ORDER — POTASSIUM CHLORIDE CRYS ER 20 MEQ PO TBCR: 20.0000 meq | EXTENDED_RELEASE_TABLET | Freq: Every day | ORAL | Status: DC | PRN

## 2021-03-14 MED ORDER — LACTATED RINGERS IV SOLN: INTRAVENOUS | Status: DC

## 2021-03-14 MED ORDER — FENTANYL CITRATE (PF) 100 MCG/2ML IJ SOLN
INTRAMUSCULAR | Status: AC
  Administered 2021-03-14: 100 ug via INTRAVENOUS
  Filled 2021-03-14: qty 2

## 2021-03-14 MED ORDER — INSULIN ASPART 100 UNIT/ML IJ SOLN
12.0000 [IU] | Freq: Once | INTRAMUSCULAR | Status: AC
  Administered 2021-03-14: 12 [IU] via SUBCUTANEOUS

## 2021-03-14 MED ORDER — PHENYLEPHRINE HCL-NACL 20-0.9 MG/250ML-% IV SOLN
INTRAVENOUS | Status: DC | PRN
  Administered 2021-03-14: 50 ug/min via INTRAVENOUS

## 2021-03-14 MED ORDER — EPHEDRINE SULFATE-NACL 50-0.9 MG/10ML-% IV SOSY
PREFILLED_SYRINGE | INTRAVENOUS | Status: DC | PRN
  Administered 2021-03-14 (×5): 10 mg via INTRAVENOUS

## 2021-03-14 MED ORDER — GUAIFENESIN-DM 100-10 MG/5ML PO SYRP: 15.0000 mL | ORAL_SOLUTION | ORAL | Status: DC | PRN

## 2021-03-14 MED ORDER — HEPARIN SODIUM (PORCINE) 5000 UNIT/ML IJ SOLN
5000.0000 [IU] | Freq: Three times a day (TID) | INTRAMUSCULAR | Status: DC
  Administered 2021-03-14 – 2021-03-16 (×6): 5000 [IU] via SUBCUTANEOUS
  Filled 2021-03-14 (×6): qty 1

## 2021-03-14 MED ORDER — OXYCODONE HCL 5 MG/5ML PO SOLN: 5.0000 mg | Freq: Once | ORAL | Status: DC | PRN

## 2021-03-14 MED ORDER — CHLORHEXIDINE GLUCONATE 0.12 % MT SOLN
OROMUCOSAL | Status: AC
  Administered 2021-03-14: 15 mL via OROMUCOSAL
  Filled 2021-03-14: qty 15

## 2021-03-14 MED ORDER — MORPHINE SULFATE (PF) 4 MG/ML IV SOLN: 4.0000 mg | INTRAVENOUS | Status: DC | PRN

## 2021-03-14 MED ORDER — HYDRALAZINE HCL 20 MG/ML IJ SOLN: 5.0000 mg | INTRAMUSCULAR | Status: DC | PRN

## 2021-03-14 MED ORDER — MIDAZOLAM HCL 2 MG/2ML IJ SOLN
INTRAMUSCULAR | Status: AC
  Filled 2021-03-14: qty 2

## 2021-03-14 MED ORDER — FENTANYL CITRATE (PF) 100 MCG/2ML IJ SOLN: 25.0000 ug | INTRAMUSCULAR | Status: DC | PRN

## 2021-03-14 MED ORDER — MAGNESIUM SULFATE 2 GM/50ML IV SOLN: 2.0000 g | Freq: Every day | INTRAVENOUS | Status: DC | PRN

## 2021-03-14 MED ORDER — STERILE WATER FOR IRRIGATION IR SOLN
Status: DC | PRN
  Administered 2021-03-14: 1000 mL

## 2021-03-14 MED ORDER — ACETAMINOPHEN 325 MG PO TABS
650.0000 mg | ORAL_TABLET | Freq: Four times a day (QID) | ORAL | Status: DC
  Administered 2021-03-14 – 2021-03-16 (×8): 650 mg via ORAL
  Filled 2021-03-14 (×9): qty 2

## 2021-03-14 MED ORDER — 0.9 % SODIUM CHLORIDE (POUR BTL) OPTIME
TOPICAL | Status: DC | PRN
  Administered 2021-03-14: 1000 mL

## 2021-03-14 MED ORDER — METOPROLOL TARTRATE 5 MG/5ML IV SOLN: 2.0000 mg | INTRAVENOUS | Status: DC | PRN

## 2021-03-14 MED ORDER — SCOPOLAMINE 1 MG/3DAYS TD PT72
MEDICATED_PATCH | TRANSDERMAL | Status: DC | PRN
  Administered 2021-03-14: 1 via TRANSDERMAL

## 2021-03-14 MED ORDER — ACETAMINOPHEN 325 MG PO TABS: 325.0000 mg | ORAL_TABLET | ORAL | Status: DC | PRN

## 2021-03-14 MED ORDER — PHENOL 1.4 % MT LIQD: 1.0000 | OROMUCOSAL | Status: DC | PRN

## 2021-03-14 MED ORDER — ALUM & MAG HYDROXIDE-SIMETH 200-200-20 MG/5ML PO SUSP: 15.0000 mL | ORAL | Status: DC | PRN

## 2021-03-14 MED ORDER — LABETALOL HCL 5 MG/ML IV SOLN: 10.0000 mg | INTRAVENOUS | Status: DC | PRN

## 2021-03-14 MED ORDER — PROPOFOL 10 MG/ML IV BOLUS
INTRAVENOUS | Status: DC | PRN
  Administered 2021-03-14: 130 mg via INTRAVENOUS

## 2021-03-14 MED ORDER — ACETAMINOPHEN 160 MG/5ML PO SOLN: 325.0000 mg | ORAL | Status: DC | PRN

## 2021-03-14 MED ORDER — MIDAZOLAM HCL 2 MG/2ML IJ SOLN
INTRAMUSCULAR | Status: AC
  Administered 2021-03-14: 2 mg
  Filled 2021-03-14: qty 2

## 2021-03-14 MED ORDER — SCOPOLAMINE 1 MG/3DAYS TD PT72
MEDICATED_PATCH | TRANSDERMAL | Status: AC
  Filled 2021-03-14: qty 1

## 2021-03-14 MED ORDER — CHLORHEXIDINE GLUCONATE 0.12 % MT SOLN: 15.0000 mL | Freq: Once | OROMUCOSAL | Status: AC

## 2021-03-14 MED ORDER — LIDOCAINE HCL (CARDIAC) PF 100 MG/5ML IV SOSY
PREFILLED_SYRINGE | INTRAVENOUS | Status: DC | PRN
  Administered 2021-03-14: 40 mg via INTRAVENOUS

## 2021-03-14 MED ORDER — MEPERIDINE HCL 25 MG/ML IJ SOLN: 6.2500 mg | INTRAMUSCULAR | Status: DC | PRN

## 2021-03-14 MED ORDER — FENTANYL CITRATE (PF) 250 MCG/5ML IJ SOLN
INTRAMUSCULAR | Status: AC
  Filled 2021-03-14: qty 5

## 2021-03-14 MED ORDER — FENTANYL CITRATE (PF) 100 MCG/2ML IJ SOLN: 100.0000 ug | Freq: Once | INTRAMUSCULAR | Status: AC

## 2021-03-14 MED ORDER — OXYCODONE HCL 5 MG PO TABS
5.0000 mg | ORAL_TABLET | ORAL | Status: DC | PRN
  Administered 2021-03-15 – 2021-03-16 (×3): 10 mg via ORAL
  Filled 2021-03-14 (×3): qty 2

## 2021-03-14 MED ORDER — DOCUSATE SODIUM 100 MG PO CAPS
100.0000 mg | ORAL_CAPSULE | Freq: Every day | ORAL | Status: DC
  Administered 2021-03-15: 100 mg via ORAL
  Filled 2021-03-14 (×2): qty 1

## 2021-03-14 MED ORDER — OXYCODONE HCL 5 MG PO TABS: 5.0000 mg | ORAL_TABLET | Freq: Once | ORAL | Status: DC | PRN

## 2021-03-14 MED ORDER — DEXAMETHASONE SODIUM PHOSPHATE 4 MG/ML IJ SOLN
INTRAMUSCULAR | Status: DC | PRN
Start: 2021-03-14 — End: 2021-03-14
  Administered 2021-03-14: 4 mg via INTRAVENOUS

## 2021-03-14 MED ORDER — PROPOFOL 10 MG/ML IV BOLUS
INTRAVENOUS | Status: AC
  Filled 2021-03-14: qty 20

## 2021-03-14 MED ORDER — ORAL CARE MOUTH RINSE: 15.0000 mL | Freq: Once | OROMUCOSAL | Status: AC

## 2021-03-14 SURGICAL SUPPLY — 55 items
APL PRP STRL LF DISP 70% ISPRP (MISCELLANEOUS)
BAG COUNTER SPONGE SURGICOUNT (BAG) ×3 IMPLANT
BAG SPNG CNTER NS LX DISP (BAG) ×2
BLADE LONG MED 31X9 (MISCELLANEOUS) ×1 IMPLANT
BLADE SAGITTAL 25.0X1.19X90 (BLADE) ×3 IMPLANT
BLADE SURG 21 STRL SS (BLADE) ×3 IMPLANT
BNDG COHESIVE 6X5 TAN STRL LF (GAUZE/BANDAGES/DRESSINGS) ×3 IMPLANT
CANISTER SUCT 3000ML PPV (MISCELLANEOUS) ×3 IMPLANT
CANISTER WOUNDNEG PRESSURE 500 (CANNISTER) ×1 IMPLANT
CHLORAPREP W/TINT 26 (MISCELLANEOUS) ×2 IMPLANT
CLIP TI MEDIUM 6 (CLIP) ×1 IMPLANT
CLIP TI WIDE RED SMALL 6 (CLIP) ×1 IMPLANT
COVER SURGICAL LIGHT HANDLE (MISCELLANEOUS) ×3 IMPLANT
CUFF TOURN SGL QUICK 34 (TOURNIQUET CUFF) ×3
CUFF TRNQT CYL 34X4X40X1 (TOURNIQUET CUFF) IMPLANT
DRAPE DERMATAC (DRAPES) ×1 IMPLANT
DRAPE HALF SHEET 40X57 (DRAPES) ×3 IMPLANT
DRAPE INCISE IOBAN 66X45 STRL (DRAPES) ×1 IMPLANT
DRAPE ORTHO SPLIT 77X108 STRL (DRAPES) ×6
DRAPE SURG ORHT 6 SPLT 77X108 (DRAPES) ×4 IMPLANT
DRILL BIT 2.0 (BIT) ×3 IMPLANT
ELECT REM PT RETURN 9FT ADLT (ELECTROSURGICAL) ×3
ELECTRODE REM PT RTRN 9FT ADLT (ELECTROSURGICAL) ×2 IMPLANT
GAUZE SPONGE 4X4 12PLY STRL (GAUZE/BANDAGES/DRESSINGS) ×4 IMPLANT
GLOVE SURG MICRO LTX SZ6 (GLOVE) ×1 IMPLANT
GLOVE SURG POLYISO LF SZ8 (GLOVE) ×3 IMPLANT
GLOVE SURG PR MICRO ENCORE 7 (GLOVE) ×1 IMPLANT
GLOVE SURG UNDER POLY LF SZ7 (GLOVE) ×1 IMPLANT
GOWN STRL REUS W/ TWL LRG LVL3 (GOWN DISPOSABLE) ×4 IMPLANT
GOWN STRL REUS W/ TWL XL LVL3 (GOWN DISPOSABLE) ×2 IMPLANT
GOWN STRL REUS W/TWL LRG LVL3 (GOWN DISPOSABLE) ×6
GOWN STRL REUS W/TWL XL LVL3 (GOWN DISPOSABLE) ×3
KIT BASIN OR (CUSTOM PROCEDURE TRAY) ×3 IMPLANT
KIT TURNOVER KIT B (KITS) ×3 IMPLANT
NS IRRIG 1000ML POUR BTL (IV SOLUTION) ×3 IMPLANT
PACK GENERAL/GYN (CUSTOM PROCEDURE TRAY) ×3 IMPLANT
PAD ARMBOARD 7.5X6 YLW CONV (MISCELLANEOUS) ×6 IMPLANT
PENCIL SMOKE EVACUATOR (MISCELLANEOUS) ×3 IMPLANT
PREVENA RESTOR ARTHOFORM 46X30 (CANNISTER) ×1 IMPLANT
STAPLER SKIN 35 REG (STAPLE) ×3 IMPLANT
STAPLER VISISTAT 35W (STAPLE) ×3 IMPLANT
STOCKINETTE IMPERVIOUS LG (DRAPES) ×3 IMPLANT
SUT BONE WAX W31G (SUTURE) IMPLANT
SUT ETHILON 2 0 PSLX (SUTURE) ×6 IMPLANT
SUT FIBERWIRE #5 38 CONV NDL (SUTURE) ×6
SUT SILK 0 TIES 10X30 (SUTURE) ×3 IMPLANT
SUT SILK 2 0 (SUTURE) ×3
SUT SILK 2 0 SH CR/8 (SUTURE) ×3 IMPLANT
SUT SILK 2-0 18XBRD TIE 12 (SUTURE) ×2 IMPLANT
SUT VIC AB 0 CT1 18XCR BRD 8 (SUTURE) ×4 IMPLANT
SUT VIC AB 0 CT1 8-18 (SUTURE) ×9
SUTURE FIBERWR #5 38 CONV NDL (SUTURE) IMPLANT
TOWEL GREEN STERILE (TOWEL DISPOSABLE) ×6 IMPLANT
UNDERPAD 30X36 HEAVY ABSORB (UNDERPADS AND DIAPERS) ×3 IMPLANT
WATER STERILE IRR 1000ML POUR (IV SOLUTION) ×3 IMPLANT

## 2021-03-14 NOTE — Progress Notes (Signed)
Id brief note   S/p amputation bka rle for diabetic foot infection   -finish 5 more days oral abx doxy/cefdinir for mop up therapy given cellulitic change had involved the rle as well -id will sign off -discussed with primary team

## 2021-03-14 NOTE — Progress Notes (Signed)
Unsuccessful attempt at IV x 1 in Lt forearm.  Pt moved during insertion and did not tolerated insertion well.  To consult IV team for insertions

## 2021-03-14 NOTE — Anesthesia Procedure Notes (Signed)
Anesthesia Regional Block: Adductor canal block   Pre-Anesthetic Checklist: , timeout performed,  Correct Patient, Correct Site, Correct Laterality,  Correct Procedure, Correct Position, site marked,  Risks and benefits discussed,  Surgical consent,  Pre-op evaluation,  At surgeon's request and post-op pain management  Laterality: Right  Prep: chloraprep       Needles:  Injection technique: Single-shot  Needle Type: Echogenic Stimulator Needle     Needle Length: 5cm  Needle Gauge: 22     Additional Needles:   Procedures:, nerve stimulator,,, ultrasound used (permanent image in chart),,    Narrative:  Start time: 03/14/2021 12:10 PM End time: 03/14/2021 12:15 PM Injection made incrementally with aspirations every 5 mL.  Performed by: Personally  Anesthesiologist: Bethena Midget, MD  Additional Notes: Functioning IV was confirmed and monitors were applied.  A 79mm 22ga Arrow echogenic stimulator needle was used. Sterile prep and drape,hand hygiene and sterile gloves were used. Ultrasound guidance: relevant anatomy identified, needle position confirmed, local anesthetic spread visualized around nerve(s)., vascular puncture avoided.  Image printed for medical record. Negative aspiration and negative test dose prior to incremental administration of local anesthetic. The patient tolerated the procedure well.

## 2021-03-14 NOTE — Progress Notes (Signed)
Orthopedic Tech Progress Note Patient Details:  Christy [name] 1960-10-19 [id number] Called in order to [facility] for [facility] Patient ID: Sheela Stack, female   DOB: [birth year], 61 y.o.   MRN: [id number]  Lovett Calender 03/14/2021, 3:01 PM

## 2021-03-14 NOTE — Transfer of Care (Signed)
Immediate Anesthesia Transfer of Care Note  Patient: Christy [name]  Procedure(s) Performed: RIGHT BELOW KNEE AMPUTATION (Right: Knee)  Patient Location: PACU  Anesthesia Type:GA combined with regional for post-op pain  Level of Consciousness: awake and patient cooperative  Airway & Oxygen Therapy: Patient Spontanous Breathing  Post-op Assessment: Report given to RN and Post -op Vital signs reviewed and stable  Post vital signs: Reviewed and stable  Last Vitals:  Vitals Value Taken Time  BP    Temp    Pulse    Resp    SpO2      Last Pain:  Vitals:   03/14/21 1109  TempSrc: Oral  PainSc: 0-No pain         Complications: No notable events documented.

## 2021-03-14 NOTE — Progress Notes (Signed)
PROGRESS NOTE    KARALYNN HASSE  [id number] DOB: Nov 18, 1960 DOA: 03/10/2021 PCP: Wanita Chamberlain, PA-C   Chief Complain: Ulcer on the right foot, swelling  Brief Narrative: Patient is a 61 year old female with history of peripheral vascular disease with amputation of right fourth and fifth metatarsal, phalanges, diabetes type 2 hypertension, CKD stage IIIb who presented to the emergency department at [facility] with complaints of ulcer on the right foot and swelling.  She was following up with wound care.  She was complaining of pain with weightbearing.  X-ray of the right foot showed lytic destruction involving the fifth metatarsal cuboid and navicular bone consistent with osteomyelitis.  MRI showed advanced neuropathic foot consistent with osteomyelitis, developed complex fluid collection surrounding the midfoot with complex fluid in the extensor hallucis long tendon sheath suspicious for infectious tenosynovitis.  Patient was admitted for the management of osteomyelitis.  Transferred to [facility]. Orthopedics,vascular surgery , ID consulted .Plan for right BKA today   Assessment & Plan:   Principal Problem:   Osteomyelitis of right foot ([place]) Active Problems:   Hypertension   Arteriosclerotic cardiovascular disease (ASCVD)   Diabetes mellitus, type 2 ([place])   Diabetic foot ulcer ([place])   Acute osteomyelitis/chronic ulcer of right foot: Presented with chronic right foot  ulcer, pain, edema.  Hemodynamically stable on presentation, not septic.  X-ray was consistent with osteomyelitis showing likely destruction of fifth metatarsal, cuboid and navicular bones.  MRI consistent with osteomyelitis, infectious tenosynovitis,charcot  foot. Currently in vancomycin, cefepime.  Follow blood cultures,NGTD Elevated CRP. Plan for right BKA today by vascular surgery.  History of peripheral vascular disease: Was following with podiatry, vascular surgery as an outpatient.  Lower extremity  arterial duplex of the right showed chronic occlusion noted in SFA and PTA that was seen on previous  exams. Stenosis noted in area of proximal anastomosis of bypass graft.   CKD stage IIIb: Currently kidney function at baseline  Normocytic anemia: Likely associated with CKD.  Iron studies showed low normal iron level  Chronic hyponatremia: Baseline sodium between 125-130.  Continue to monitor  Type 2 diabetes: On glipizide, metformin at home.  Continue sliding-scale insulin.  Hemoglobin A1c of 7.5  Hypertension: Continue lisinopril and metoprolol.  Monitor blood pressure  Obesity: BMI of 35         DVT prophylaxis:SCD Code Status: Full Family Communication: None at bedside Patient status:Inpatient  Dispo: The patient is from: Home              Anticipated d/c is [id number]  vs SnF              Anticipated d/c date is: In next 2-3 days,need PT/OT evaluation  Consultants: Orthopedics, ID  Procedures: None yet  Antimicrobials:  Anti-infectives (From admission, onward)    Start     Dose/Rate Route Frequency Ordered Stop   03/12/21 1500  [MAR Hold]  vancomycin (VANCOREADY) IVPB 1500 mg/300 mL        (MAR Hold since Fri 03/14/2021 at 1059.Hold Reason: Transfer to a Procedural area)  See Hyperspace for full Linked Orders Report.   1,500 mg 150 mL/hr over 120 Minutes Intravenous Every 24 hours 03/11/21 1441     03/12/21 1000  [MAR Hold]  ceFEPIme (MAXIPIME) 2 g in sodium chloride 0.9 % 100 mL IVPB        (MAR Hold since Fri 03/14/2021 at 1059.Hold Reason: Transfer to a Procedural area)   2 g 200 mL/hr over 30 Minutes Intravenous  Every 12 hours 03/12/21 0830     03/11/21 1500  vancomycin (VANCOREADY) IVPB 2000 mg/400 mL       See Hyperspace for full Linked Orders Report.   2,000 mg 200 mL/hr over 120 Minutes Intravenous  Once 03/11/21 1441 03/11/21 1845   03/10/21 1615  vancomycin (VANCOCIN) IVPB 1000 mg/200 mL premix        1,000 mg 200 mL/hr over 60 Minutes Intravenous  Once  03/10/21 1609 03/10/21 2201       Subjective:  Patient seen and examined the bedside this morning.  She was waiting for surgery.  She does not have any complaints today.  Denies any pain on the right foot  Objective: Vitals:   03/13/21 2104 03/14/21 0437 03/14/21 0900 03/14/21 1109  BP: 140/61 (!) 129/48 (!) 115/59 (!) 126/48  Pulse: 70 (!) 57 (!) 56 (!) 56  Resp: 16 16 17 20   Temp: (!) 97.5 F (36.4 C) 98 F (36.7 C) 97.7 F (36.5 C) 97.9 F (36.6 C)  TempSrc: Oral  Oral Oral  SpO2: 97% 96% 97% 96%  Weight:      Height:        Intake/Output Summary (Last 24 hours) at 03/14/2021 1124 Last data filed at 03/13/2021 1557 Gross per 24 hour  Intake 900 ml  Output --  Net 900 ml   Filed Weights   03/10/21 1045  Weight: 96.6 kg    Examination:   General exam: Overall comfortable, not in distress,obese HEENT: PERRL Respiratory system:  no wheezes or crackles  Cardiovascular system: S1 & S2 heard, RRR.  Gastrointestinal system: Abdomen is nondistended, soft and nontender. Central nervous system: Alert and oriented Extremities: Edema of the right foot, ulcer on the bottom  of the right foot Skin: No rashes,no icterus     Data Reviewed: I have personally reviewed following labs and imaging studies  CBC: Recent Labs  Lab 03/10/21 1242 03/11/21 0010 03/12/21 0852 03/13/21 0311 03/14/21 0124  WBC 11.1* 8.4 7.5 8.3 7.8  NEUTROABS 8.5*  --  4.9 5.5 4.6  HGB 9.0* 8.9* 8.8* 8.8* 8.3*  HCT 30.4* 28.5* 27.7* 28.2* 27.3*  MCV 93.3 92.8 89.9 90.4 91.0  PLT 516* 514* 473* 443* [id number]*   Basic Metabolic Panel: Recent Labs  Lab 03/10/21 1242 03/11/21 0010 03/12/21 0852 03/13/21 0311 03/14/21 0124  NA 130* 129* 128* 130* 131*  K 4.6 4.7 5.2* 5.2* 4.5  CL 94* 96* 94* 99 99  CO2 23 26 25 23 23   GLUCOSE 155* 159* 208* 177* 171*  BUN 23* 28* 21* 21* 25*  CREATININE 1.47* 1.42* 1.26* 1.24* 1.21*  CALCIUM 9.0 8.8* 9.1 8.7* 8.8*   GFR: Estimated Creatinine Clearance:  57.3 mL/min (A) (by C-G formula based on SCr of 1.21 mg/dL (H)). Liver Function Tests: Recent Labs  Lab 03/11/21 0010  AST 61*  ALT 53*  ALKPHOS 127*  BILITOT 0.3  PROT 7.8  ALBUMIN 2.2*   No results for input(s): LIPASE, AMYLASE in the last 168 hours. No results for input(s): AMMONIA in the last 168 hours. Coagulation Profile: No results for input(s): INR, PROTIME in the last 168 hours. Cardiac Enzymes: No results for input(s): CKTOTAL, CKMB, CKMBINDEX, TROPONINI in the last 168 hours. BNP (last 3 results) No results for input(s): PROBNP in the last 8760 hours. HbA1C: No results for input(s): HGBA1C in the last 72 hours.  CBG: Recent Labs  Lab 03/13/21 1524 03/13/21 2105 03/14/21 0050 03/14/21 0437 03/14/21 0919  GLUCAP 164* 179*  169* 147* 149*   Lipid Profile: No results for input(s): CHOL, HDL, LDLCALC, TRIG, CHOLHDL, LDLDIRECT in the last 72 hours. Thyroid Function Tests: No results for input(s): TSH, T4TOTAL, FREET4, T3FREE, THYROIDAB in the last 72 hours. Anemia Panel: Recent Labs    03/12/21 0852  FERRITIN 158  TIBC 301  IRON 28   Sepsis Labs: No results for input(s): PROCALCITON, LATICACIDVEN in the last 168 hours.  Recent Results (from the past 240 hour(s))  Resp Panel by RT-PCR (Flu A&B, Covid) Nasopharyngeal Swab     Status: None   Collection Time: 03/10/21  2:57 PM   Specimen: Nasopharyngeal Swab; Nasopharyngeal(NP) swabs in vial transport medium  Result Value Ref Range Status   SARS Coronavirus 2 by RT PCR NEGATIVE NEGATIVE Final    Comment: (NOTE) SARS-CoV-2 target nucleic acids are NOT DETECTED.  The SARS-CoV-2 RNA is generally detectable in upper respiratory specimens during the acute phase of infection. The lowest concentration of SARS-CoV-2 viral copies this assay can detect is 138 copies/mL. A negative result does not preclude SARS-Cov-2 infection and should not be used as the sole basis for treatment or other patient management  decisions. A negative result may occur with  improper specimen collection/handling, submission of specimen other than nasopharyngeal swab, presence of viral mutation(s) within the areas targeted by this assay, and inadequate number of viral copies(<138 copies/mL). A negative result must be combined with clinical observations, patient history, and epidemiological information. The expected result is Negative.  Fact Sheet for Patients:  EntrepreneurPulse.com.au  Fact Sheet for Healthcare Providers:  IncredibleEmployment.be  This test is no t yet approved or cleared by the [place] FDA and  has been authorized for detection and/or diagnosis of SARS-CoV-2 by FDA under an Emergency Use Authorization (EUA). This EUA will remain  in effect (meaning this test can be used) for the duration of the COVID-19 declaration under Section 564(b)(1) of the Act, 21 U.S.C.section 360bbb-3(b)(1), unless the authorization is terminated  or revoked sooner.       Influenza A by PCR NEGATIVE NEGATIVE Final   Influenza B by PCR NEGATIVE NEGATIVE Final    Comment: (NOTE) The Xpert Xpress SARS-CoV-2/FLU/RSV plus assay is intended as an aid in the diagnosis of influenza from Nasopharyngeal swab specimens and should not be used as a sole basis for treatment. Nasal washings and aspirates are unacceptable for Xpert Xpress SARS-CoV-2/FLU/RSV testing.  Fact Sheet for Patients: EntrepreneurPulse.com.au  Fact Sheet for Healthcare Providers: IncredibleEmployment.be  This test is not yet approved or cleared by the [place] FDA and has been authorized for detection and/or diagnosis of SARS-CoV-2 by FDA under an Emergency Use Authorization (EUA). This EUA will remain in effect (meaning this test can be used) for the duration of the COVID-19 declaration under Section 564(b)(1) of the Act, 21 U.S.C. section 360bbb-3(b)(1), unless the  authorization is terminated or revoked.  Performed at [facility], [address], [place], [place] [address]   Culture, blood (routine x 2)     Status: None (Preliminary result)   Collection Time: 03/11/21 12:10 AM   Specimen: Right Antecubital; Blood  Result Value Ref Range Status   Specimen Description RIGHT ANTECUBITAL  Final   Special Requests   Final    BOTTLES DRAWN AEROBIC AND ANAEROBIC Blood Culture adequate volume   Culture   Final    NO GROWTH 2 DAYS Performed at [facility], [address], [place], [place] [address]    Report Status PENDING  Incomplete  Culture, blood (  routine x 2)     Status: None (Preliminary result)   Collection Time: 03/11/21 12:10 AM   Specimen: BLOOD RIGHT FOREARM  Result Value Ref Range Status   Specimen Description BLOOD RIGHT FOREARM  Final   Special Requests   Final    BOTTLES DRAWN AEROBIC AND ANAEROBIC Blood Culture results may not be optimal due to an excessive volume of blood received in culture bottles   Culture   Final    NO GROWTH 2 DAYS Performed at [facility], [address], [place], [place] [address]    Report Status PENDING  Incomplete  MRSA Next Gen by PCR, Nasal     Status: None   Collection Time: 03/12/21 11:15 AM   Specimen: Nasal Mucosa; Nasal Swab  Result Value Ref Range Status   MRSA by PCR Next Gen NOT DETECTED NOT DETECTED Final    Comment: (NOTE) The GeneXpert MRSA Assay (FDA approved for NASAL specimens only), is one component of a comprehensive MRSA colonization surveillance program. It is not intended to diagnose MRSA infection nor to guide or monitor treatment for MRSA infections. Test performance is not FDA approved in patients less than 65 years old. Performed at [facility], [place] [address] [place], [place] [address]          Radiology Studies: VAS [place] LOWER EXTREMITY ARTERIAL DUPLEX  Result Date: 03/12/2021 LOWER EXTREMITY ARTERIAL DUPLEX STUDY Patient Name:  ALDYTH KETRING  Date of Exam:    03/12/2021 Medical Rec #: [id number]       Accession #:    [id number] Date of Birth: 06/08/1960       Patient Gender: F Patient Age:   56 years Exam Location:  [facility] Procedure:      VAS [place] LOWER EXTREMITY ARTERIAL DUPLEX Referring Phys: Karoline Caldwell --------------------------------------------------------------------------------  Indications: Peripheral artery disease. High Risk Factors: Hypertension, hyperlipidemia, Diabetes, current smoker,                    coronary artery disease.  Vascular Interventions: RLE fem-pop bypass 10/2013. Current ABI:            RLE 0.72                         LLE 0.50 Comparison Study: Previous arterial duplex was 01/24/14 - unable to access                   results. Performing Technologist: Rogelia Rohrer RVT, RDMS  Examination Guidelines: A complete evaluation includes B-mode imaging, spectral Doppler, color Doppler, and power Doppler as needed of all accessible portions of each vessel. Bilateral testing is considered an integral part of a complete examination. Limited examinations for reoccurring indications may be performed as noted.  +-----------+--------+-----+--------+----------+--------+  RIGHT       PSV cm/s Ratio Stenosis Waveform   Comments  +-----------+--------+-----+--------+----------+--------+  CFA Prox    168                     biphasic             +-----------+--------+-----+--------+----------+--------+  DFA         76                      monophasic           +-----------+--------+-----+--------+----------+--------+  SFA Prox    46  monophasic           +-----------+--------+-----+--------+----------+--------+  SFA Mid                    occluded                      +-----------+--------+-----+--------+----------+--------+  POP Prox                   occluded                      +-----------+--------+-----+--------+----------+--------+  TP Trunk    118                     monophasic            +-----------+--------+-----+--------+----------+--------+  ATA Prox    37                      monophasic           +-----------+--------+-----+--------+----------+--------+  ATA Distal  29                      monophasic           +-----------+--------+-----+--------+----------+--------+  PTA Prox                   occluded                      +-----------+--------+-----+--------+----------+--------+  PERO Prox   64                      monophasic           +-----------+--------+-----+--------+----------+--------+  PERO Distal 67                      monophasic           +-----------+--------+-----+--------+----------+--------+  Right Graft #1: +------------------+--------+---------------+----------+--------+                     PSV cm/s Stenosis        Waveform   Comments  +------------------+--------+---------------+----------+--------+  Inflow             154                      biphasic             +------------------+--------+---------------+----------+--------+  Prox Anastomosis   262      50-70% stenosis monophasic           +------------------+--------+---------------+----------+--------+  Proximal Graft     55                       monophasic           +------------------+--------+---------------+----------+--------+  Mid Graft          46                       monophasic           +------------------+--------+---------------+----------+--------+  Distal Graft       34                       monophasic           +------------------+--------+---------------+----------+--------+  Distal Anastomosis 71  monophasic           +------------------+--------+---------------+----------+--------+  Outflow            135                      monophasic           +------------------+--------+---------------+----------+--------+   Summary: Right: Chronic occlusion noted in SFA and PTA that was seen on previous exams. Stenosis noted in area of proximal anastomosis of bypass graft.  See table(s)  above for measurements and observations. Electronically signed by Harold Barban MD on 03/12/2021 at 9:17:30 PM.    Final         Scheduled Meds:  [MAR Hold] aspirin EC  81 mg Oral Daily   [MAR Hold] atorvastatin  20 mg Oral QPM   [MAR Hold] citalopram  20 mg Oral Daily   [MAR Hold] insulin aspart  0-5 Units Subcutaneous QHS   [MAR Hold] insulin aspart  0-9 Units Subcutaneous TID WC   [MAR Hold] lisinopril  10 mg Oral Daily   [MAR Hold] metoprolol tartrate  25 mg Oral BID   Continuous Infusions:  [MAR Hold] ceFEPime (MAXIPIME) IV 2 g (03/13/21 2307)   lactated ringers 10 mL/hr at 03/14/21 1115   [MAR Hold] vancomycin 1,500 mg (03/14/21 1033)     LOS: 4 days         Shelly Coss, MD [facility] P1/27/2023, 11:24 AM

## 2021-03-14 NOTE — Anesthesia Procedure Notes (Addendum)
Anesthesia Regional Block: Popliteal block   Pre-Anesthetic Checklist: , timeout performed,  Correct Patient, Correct Site, Correct Laterality,  Correct Procedure, Correct Position, site marked,  Risks and benefits discussed,  Surgical consent,  Pre-op evaluation,  At surgeon's request and post-op pain management  Laterality: Left and Right  Prep: chloraprep       Needles:  Injection technique: Single-shot  Needle Type: Echogenic Stimulator Needle     Needle Length: 5cm  Needle Gauge: 22     Additional Needles:   Procedures:, nerve stimulator,,, ultrasound used (permanent image in chart),,     Nerve Stimulator or Paresthesia:  Response: foot  Additional Responses:   Narrative:  Start time: 03/14/2021 12:00 PM End time: 03/14/2021 12:05 PM Injection made incrementally with aspirations every 5 mL.  Performed by: Personally  Anesthesiologist: Janeece Riggers, MD  Additional Notes: Functioning IV was confirmed and monitors were applied.  A 102mm 22ga Arrow echogenic stimulator needle was used. Sterile prep and drape,hand hygiene and sterile gloves were used. Ultrasound guidance: relevant anatomy identified, needle position confirmed, local anesthetic spread visualized around nerve(s)., vascular puncture avoided.  Image printed for medical record. Negative aspiration and negative test dose prior to incremental administration of local anesthetic. The patient tolerated the procedure well.

## 2021-03-14 NOTE — Progress Notes (Signed)
Orthopedic Tech Progress Note Patient Details:  Christy [name] February 21, 1960 [id number]  Patient ID: Christy [name], female   DOB: 04-25-1960, 61 y.o.   MRN: [id number] Brace ordered.  Edwina Barth 03/14/2021, 6:55 PM

## 2021-03-14 NOTE — Anesthesia Preprocedure Evaluation (Signed)
Anesthesia Evaluation  Patient identified by MRN, date of birth, ID band Patient awake    Reviewed: Allergy & Precautions, H&P , NPO status , Patient's Chart, lab work & pertinent test results, reviewed documented beta blocker date and time   History of Anesthesia Complications (+) PONV and history of anesthetic complications  Airway Mallampati: I  TM Distance: >3 FB     Dental  (+) Edentulous Upper   Pulmonary Current Smoker and Patient abstained from smoking.,    breath sounds clear to auscultation       Cardiovascular hypertension, + CAD, + Past MI and + Cardiac Stents   Rhythm:Regular Rate:Normal     Neuro/Psych PSYCHIATRIC DISORDERS Depression    GI/Hepatic negative GI ROS,   Endo/Other  diabetes, Well Controlled, Type 2, Oral Hypoglycemic Agents  Renal/GU      Musculoskeletal   Abdominal   Peds  Hematology   Anesthesia Other Findings   Reproductive/Obstetrics                             Anesthesia Physical  Anesthesia Plan  ASA: 3  Anesthesia Plan: General and Regional   Post-op Pain Management:    Induction: Intravenous  PONV Risk Score and Plan: 3 and Ondansetron, Dexamethasone, Treatment may vary due to age or medical condition and Midazolam  Airway Management Planned: Oral ETT and LMA  Additional Equipment:   Intra-op Plan:   Post-operative Plan: Extubation in OR  Informed Consent: I have reviewed the patients History and Physical, chart, labs and discussed the procedure including the risks, benefits and alternatives for the proposed anesthesia with the patient or authorized representative who has indicated his/her understanding and acceptance.       Plan Discussed with: Anesthesiologist and CRNA  Anesthesia Plan Comments: Christy [name] is a 61 y.o. female with medical history significant for peripheral vascular disease with amputation of right fourth and  fifth metatarsals + phalanges.  Diabetes mellitus, hypertension, CKD. Patient presented to the ED with complaints of ulcer on her right foot and swelling.  Also has chronic, and she follows with wound care for this.  She reports swelling to her right foot over the past month.  She reports pain in her foot is present when she bears weight.  She went to the outpatient rehab center today, patient vomited, reported feeling weak and dizzy, was referred to the ED.)        Anesthesia Quick Evaluation

## 2021-03-14 NOTE — Progress Notes (Signed)
Pt back from surgery via bed.  Denies any pain.  Stump shrinking device  to RLE is clean, dry, and intact.  Pt has wound vac attached at 125 of suction with no drainage. RLE is numb r/t regional block.

## 2021-03-14 NOTE — Op Note (Signed)
DATE OF SERVICE: 03/14/2021  PATIENT:  Christy [name]  61 y.o. female  PRE-OPERATIVE DIAGNOSIS: right lower extremity chronic limb threatening ischemia with no options for revascularization  POST-OPERATIVE DIAGNOSIS:  Same  PROCEDURE:   Right below knee amputation  SURGEON:  Surgeon(s) and Role:    * Leonie Douglas, MD - Primary  ASSISTANT: Natasha Bence, RNFA  An assistant was required to facilitate exposure and expedite the case.  ANESTHESIA:   regional and general  EBL:  BLOOD ADMINISTERED:none  DRAINS: none   LOCAL MEDICATIONS USED:  NONE  SPECIMEN:  residual right limb  COUNTS: confirmed correct.  TOURNIQUET:    Total Tourniquet Time Documented: Thigh (Right) - 9 minutes Total: Thigh (Right) - 9 minutes  PATIENT DISPOSITION:  PACU - hemodynamically stable.   Delay start of Pharmacological VTE agent (>24hrs) due to surgical blood loss or risk of bleeding: no  INDICATION FOR PROCEDURE: Christy [name] is a 61 y.o. female with  right lower extremity chronic limb threatening ischemia with no options for revascularization. After careful discussion of risks, benefits, and alternatives the patient was offered right below knee amputation. The patient understood and wished to proceed.  OPERATIVE FINDINGS: unremarkable below knee amputation  DESCRIPTION OF PROCEDURE: After identification of the patient in the pre-operative holding area, the patient was transferred to the operating room. The patient was positioned supine on the operating room table. Anesthesia was induced. The right leg was prepped and draped in standard fashion. A surgical pause was performed confirming correct patient, procedure, and operative location.  A tourniquet was placed on the right thigh. The skin of the leg was marked to plan the anterior flap 10 cm distal to the tibial tuberosity. The anterior flap measured two thirds the circumference of the calf. The posterior flap was measured to be  one third of the circumference of the calf in length.   The leg was exsanguinated with an Esmarch tourniquet. The pneumatic tourniquet was inflated to 250 mm Hg. The flaps were created using pre-made marks using a 21 blade. The incision was carried down through subcutaneous tissue, fascia, and muscle anteriorly. The periosteal tissue was elevated anteriorly so that the tibia was about 3 cm shorter than the anterior skin flap.  The tibia was transected with a power saw. An anterior wedge was created with the power saw.  Then I smoothed out the rough edges with a rasp.  In a similar fashion, I cut back the fibula about two centimeters higher than the level of the tibia with an angled bone cutter.  The posterior flap was completed with an amputation knife.  The specimen was passed off the field. All visible arteries and veins were clamped and ligated using silk suture.  The tourniquet was deflated.  Hemostasis was achieved in the flaps using electrocautery and silk suture.  The stump was washed with sterile normal saline and no further active bleeding was noted.    A myodesis was created using a powered drill to create a small hole through the anterior cortex of the tibia.  A #5 FiberWire was delivered through the osteotomy and through the aponeurosis of the gastrocnemius.  I reapproximated the anterior and posterior fascia  with interrupted stitches of 2-0 Vicryl.  This was completed along the entire length of anterior and posterior fascia until there was no more loose space in the fascial line.  The skin was then reapproximated with staples.  The stump was washed off and dried.  A  Prevena VAC system was applied to the stump.  This was secured with derma tack and Ioban.  A good seal was achieved.  A stump shrinker was applied.  A stump protector was applied.  Upon completion of the case instrument and sharps counts were confirmed correct. The patient was transferred to the PACU in good condition. I was present  for all portions of the procedure.  Rande Brunt. Lenell Antu, MD [facility] Phone Number: [phone] 03/14/2021 1:43 PM

## 2021-03-14 NOTE — Progress Notes (Signed)
[facility] [place] PROGRESS NOTE  ASSESSMENT / PLAN: Christy [name] is a 61 y.o. female with non-salvageable right lower extremity. Plan below knee amputation today in OR. All questions answered.   SUBJECTIVE: Explained operative plan. She is understanding.  OBJECTIVE: BP (!) 126/48    Pulse (!) 56    Temp 97.9 F (36.6 C) (Oral)    Resp 20    Ht 5' 5.4" (1.661 m)    Wt 96.6 kg    SpO2 96%    BMI 35.01 kg/m   Intake/Output Summary (Last 24 hours) at 03/14/2021 1218 Last data filed at 03/13/2021 1557 Gross per 24 hour  Intake 900 ml  Output --  Net 900 ml    No distress R leg bandaged. Fem-pop scarring.  CBC Latest Ref Rng & Units 03/14/2021 03/13/2021 03/12/2021  WBC 4.0 - 10.5 K/uL 7.8 8.3 7.5  Hemoglobin 12.0 - 15.0 g/dL 8.3(L) 8.8(L) 8.8(L)  Hematocrit 36.0 - 46.0 % 27.3(L) 28.2(L) 27.7(L)  Platelets 150 - 400 K/uL 418(H) 443(H) 473(H)     CMP Latest Ref Rng & Units 03/14/2021 03/13/2021 03/12/2021  Glucose 70 - 99 mg/dL 171(H) 177(H) 208(H)  BUN 6 - 20 mg/dL 25(H) 21(H) 21(H)  Creatinine 0.44 - 1.00 mg/dL 1.21(H) 1.24(H) 1.26(H)  Sodium 135 - 145 mmol/L 131(L) 130(L) 128(L)  Potassium 3.5 - 5.1 mmol/L 4.5 5.2(H) 5.2(H)  Chloride 98 - 111 mmol/L 99 99 94(L)  CO2 22 - 32 mmol/L 23 23 25   Calcium 8.9 - 10.3 mg/dL 8.8(L) 8.7(L) 9.1  Total Protein 6.5 - 8.1 g/dL - - -  Total Bilirubin 0.3 - 1.2 mg/dL - - -  Alkaline Phos 38 - 126 U/L - - -  AST 15 - 41 U/L - - -  ALT 0 - 44 U/L - - -    Estimated Creatinine Clearance: 57.3 mL/min (A) (by C-G formula based on SCr of 1.21 mg/dL (H)).  Christy Aline. Stanford Breed, MD [facility] Phone Number: [phone] 03/14/2021 12:18 PM

## 2021-03-14 NOTE — Progress Notes (Incomplete)
Evening glucose is 419.  Patient asymptomatic.  Sent message to on call through [place].

## 2021-03-14 NOTE — Anesthesia Procedure Notes (Signed)
Procedure Name: LMA Insertion Date/Time: 03/14/2021 12:32 PM Performed by: Michele Rockers, CRNA Pre-anesthesia Checklist: Patient identified, Patient being monitored, Timeout performed, Emergency Drugs available and Suction available Patient Re-evaluated:Patient Re-evaluated prior to induction Oxygen Delivery Method: Circle system utilized Preoxygenation: Pre-oxygenation with 100% oxygen Induction Type: IV induction LMA: LMA inserted LMA Size: 4.0 Number of attempts: 1 Placement Confirmation: positive ETCO2 and breath sounds checked- equal and bilateral Tube secured with: Tape Dental Injury: Teeth and Oropharynx as per pre-operative assessment

## 2021-03-15 DIAGNOSIS — M869 Osteomyelitis, unspecified: Secondary | ICD-10-CM | POA: Diagnosis not present

## 2021-03-15 LAB — BASIC METABOLIC PANEL
Anion gap: 12 (ref 5–15)
BUN: 26 mg/dL — ABNORMAL HIGH (ref 6–20)
CO2: 19 mmol/L — ABNORMAL LOW (ref 22–32)
Calcium: 8.8 mg/dL — ABNORMAL LOW (ref 8.9–10.3)
Chloride: 99 mmol/L (ref 98–111)
Creatinine, Ser: 1.26 mg/dL — ABNORMAL HIGH (ref 0.44–1.00)
GFR, Estimated: 49 mL/min — ABNORMAL LOW (ref >60)
Glucose, Bld: 267 mg/dL — ABNORMAL HIGH (ref 70–99)
Potassium: 4.5 mmol/L (ref 3.5–5.1)
Sodium: 130 mmol/L — ABNORMAL LOW (ref 135–145)

## 2021-03-15 LAB — CBC WITH DIFFERENTIAL/PLATELET
Abs Immature Granulocytes: 0.51 10*3/uL — ABNORMAL HIGH (ref 0.00–0.07)
Basophils Absolute: 0.1 10*3/uL (ref 0.0–0.1)
Basophils Relative: 0 %
Eosinophils Absolute: 0 10*3/uL (ref 0.0–0.5)
Eosinophils Relative: 0 %
HCT: 26.9 % — ABNORMAL LOW (ref 36.0–46.0)
Hemoglobin: 8.1 g/dL — ABNORMAL LOW (ref 12.0–15.0)
Immature Granulocytes: 4 %
Lymphocytes Relative: 12 %
Lymphs Abs: 1.5 10*3/uL (ref 0.7–4.0)
MCH: 27.7 pg (ref 26.0–34.0)
MCHC: 30.1 g/dL (ref 30.0–36.0)
MCV: 92.1 fL (ref 80.0–100.0)
Monocytes Absolute: 0.7 10*3/uL (ref 0.1–1.0)
Monocytes Relative: 5 %
Neutro Abs: 10.2 10*3/uL — ABNORMAL HIGH (ref 1.7–7.7)
Neutrophils Relative %: 79 %
Platelets: 481 10*3/uL — ABNORMAL HIGH (ref 150–400)
RBC: 2.92 MIL/uL — ABNORMAL LOW (ref 3.87–5.11)
RDW: 15.9 % — ABNORMAL HIGH (ref 11.5–15.5)
WBC: 12.9 10*3/uL — ABNORMAL HIGH (ref 4.0–10.5)
nRBC: 0 % (ref 0.0–0.2)

## 2021-03-15 LAB — GLUCOSE, CAPILLARY
Glucose-Capillary: 199 mg/dL — ABNORMAL HIGH (ref 70–99)
Glucose-Capillary: 203 mg/dL — ABNORMAL HIGH (ref 70–99)
Glucose-Capillary: 165 mg/dL — ABNORMAL HIGH (ref 70–99)
Glucose-Capillary: 196 mg/dL — ABNORMAL HIGH (ref 70–99)

## 2021-03-15 MED ORDER — CEFDINIR 300 MG PO CAPS
300.0000 mg | ORAL_CAPSULE | Freq: Two times a day (BID) | ORAL | Status: DC
  Administered 2021-03-15 – 2021-03-16 (×2): 300 mg via ORAL
  Filled 2021-03-15 (×3): qty 1

## 2021-03-15 MED ORDER — DOXYCYCLINE HYCLATE 100 MG PO TABS
100.0000 mg | ORAL_TABLET | Freq: Two times a day (BID) | ORAL | Status: DC
  Administered 2021-03-15 – 2021-03-16 (×3): 100 mg via ORAL
  Filled 2021-03-15 (×3): qty 1

## 2021-03-15 NOTE — Plan of Care (Signed)

## 2021-03-15 NOTE — Anesthesia Postprocedure Evaluation (Signed)
Anesthesia Post Note  Patient: Christy [name]  Procedure(s) Performed: RIGHT BELOW KNEE AMPUTATION (Right: Knee) MINOR APPLICATION OF WOUND VAC (Right: Leg Lower)     Patient location during evaluation: PACU Anesthesia Type: Regional and General Level of consciousness: awake and alert Pain management: pain level controlled Vital Signs Assessment: post-procedure vital signs reviewed and stable Respiratory status: spontaneous breathing, nonlabored ventilation, respiratory function stable and patient connected to nasal cannula oxygen Cardiovascular status: blood pressure returned to baseline and stable Postop Assessment: no apparent nausea or vomiting Anesthetic complications: no   No notable events documented.  Last Vitals:  Vitals:   03/14/21 2007 03/15/21 0819  BP: (!) 107/55 (!) 124/54  Pulse: 82 (!) 51  Resp: 16 16  Temp: 36.6 C 36.6 C  SpO2: 94% 97%    Last Pain:  Vitals:   03/15/21 0819  TempSrc: Oral  PainSc:                  [name]

## 2021-03-15 NOTE — TOC Initial Note (Signed)
Transition of Care [facility]) - Initial/Assessment Note    Patient Details  Name: Christy [name] MRN: [id number] Date of Birth: Sep 28, 1960  Transition of [facility] CM/SW Contact:    Bess Kinds, RN Phone Number: [phone] 03/15/2021, 3:07 PM  Clinical Narrative:                  Spoke with patient, her daughter, and her father at the bedside to discuss post acute transition. Agreeable to [facility] PT/OT. Referral accepted by [facility] - patient will need [facility] PT/OT orders with Face to Face. Patient has all needed DME to include RW, wc. Patient to transition home in private vehicle. TOC following for transition needs.   Expected Discharge Plan: Home w Home Health Services Barriers to Discharge: Continued Medical Work up   Patient Goals and CMS Choice Patient states their goals for this hospitalization and ongoing recovery are:: return home with family support CMS Medicare.gov Compare Post Acute Care list provided to:: Patient Choice offered to / list presented to : Patient  Expected Discharge Plan and Services Expected Discharge Plan: Home w Home Health Services In-house Referral: NA Discharge Planning Services: CM Consult Post Acute Care Choice: Home Health Living arrangements for the past 2 months: Single Family Home                 DME Arranged: N/A DME Agency: NA       HH Arranged: PT, OT HH Agency: [facility] Date HH Agency Contacted: 03/15/21 Time HH Agency Contacted: 1506 Representative spoke with at [facility] Agency: Laurelyn Sickle  Prior Living Arrangements/Services Living arrangements for the past 2 months: Single Family Home Lives with:: Self Patient language and need for interpreter reviewed:: Yes Do you feel safe going back to the place where you live?: Yes      Need for Family Participation in Patient Care: Yes (Comment) Care giver support system in place?: Yes (comment) Current home services: DME (wheelchair, walker) Criminal Activity/Legal Involvement Pertinent to  Current Situation/Hospitalization: No - Comment as needed  Activities of Daily Living Home Assistive Devices/Equipment: None ADL Screening (condition at time of admission) Patient's cognitive ability adequate to safely complete daily activities?: Yes Is the patient deaf or have difficulty hearing?: No Does the patient have difficulty seeing, even when wearing glasses/contacts?: No Does the patient have difficulty concentrating, remembering, or making decisions?: No Patient able to express need for assistance with ADLs?: Yes Does the patient have difficulty dressing or bathing?: No Independently performs ADLs?: Yes (appropriate for developmental age) Does the patient have difficulty walking or climbing stairs?: Yes Weakness of Legs: Right Weakness of Arms/Hands: None  Permission Sought/Granted Permission sought to share information with : Family Supports    Share Information with NAME: jennifer lovelace     Permission granted to share info w Relationship: daughter  Permission granted to share info w Contact Information: [phone]  Emotional Assessment Appearance:: Appears stated age Attitude/Demeanor/Rapport: Engaged Affect (typically observed): Accepting Orientation: : Oriented to Self, Oriented to Place, Oriented to  Time, Oriented to Situation Alcohol / Substance Use: Not Applicable Psych Involvement: No (comment)  Admission diagnosis:  Acute osteomyelitis of right foot (HCC) [M86.171] Osteomyelitis of right foot, unspecified type [facility]) [M86.9] Patient Active Problem List   Diagnosis Date Noted   Osteomyelitis of right foot (HCC) 03/10/2021   Diabetic foot ulcer (HCC) 03/10/2021   Atherosclerosis of artery of extremity with ulceration (HCC) 01/24/2014   Ischemic ulcer of right foot (HCC) 11/22/2013   PAD (peripheral artery  disease) (HCC) 10/19/2013   Atherosclerotic PVD with ulceration (HCC) 10/05/2013   Metabolic syndrome    Morbid obesity with BMI of 40.0-44.9, adult  (HCC) 12/17/2011   Hyperlipidemia 07/03/2011   Diabetes mellitus, type 2 (HCC)    Reactive depression (situational) 10/17/2010   Hypertension 10/17/2010   Tobacco abuse 10/17/2010   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

## 2021-03-15 NOTE — Progress Notes (Addendum)
°  Progress Note    03/15/2021 8:51 AM 1 Day Post-Op  Subjective:  no complaints; working with therapy.     Vitals:   03/14/21 2007 03/15/21 0819  BP: (!) 107/55 (!) 124/54  Pulse: 82 (!) 51  Resp: 16 16  Temp: 97.8 F (36.6 C) 97.8 F (36.6 C)  SpO2: 94% 97%    Physical Exam: Incisions:  bandage in place with vac.  Able to straighten knee well.    CBC    Component Value Date/Time   WBC 12.9 (H) 03/15/2021 0222   RBC 2.92 (L) 03/15/2021 0222   HGB 8.1 (L) 03/15/2021 0222   HCT 26.9 (L) 03/15/2021 0222   PLT 481 (H) 03/15/2021 0222   MCV 92.1 03/15/2021 0222   MCH 27.7 03/15/2021 0222   MCHC 30.1 03/15/2021 0222   RDW 15.9 (H) 03/15/2021 0222   LYMPHSABS 1.5 03/15/2021 0222   MONOABS 0.7 03/15/2021 0222   EOSABS 0.0 03/15/2021 0222   BASOSABS 0.1 03/15/2021 0222    BMET    Component Value Date/Time   NA 130 (L) 03/15/2021 0222   K 4.5 03/15/2021 0222   CL 99 03/15/2021 0222   CO2 19 (L) 03/15/2021 0222   GLUCOSE 267 (H) 03/15/2021 0222   BUN 26 (H) 03/15/2021 0222   CREATININE 1.26 (H) 03/15/2021 0222   CREATININE 1.16 (H) 07/14/2011 0921   CALCIUM 8.8 (L) 03/15/2021 0222   GFRNONAA 49 (L) 03/15/2021 0222   GFRAA 59 (L) 10/20/2013 0312    INR    Component Value Date/Time   INR 0.99 10/13/2013 1456     Intake/Output Summary (Last 24 hours) at 03/15/2021 0851 Last data filed at 03/15/2021 4166 Gross per 24 hour  Intake 500 ml  Output 910 ml  Net -410 ml     Assessment/Plan:  61 y.o. female is s/p right below knee amputation  1 Day Post-Op  -bandage in place with vac.  Will leave on for a week or take off before pt is discharged.   -continue with therapy -f/u in our office in 4 weeks for staple removal.  Our office will arrange appt   Doreatha Massed, PA-C [facility] [phone] 03/15/2021 8:51 AM    I have independently interviewed and examined patient and agree with PA assessment and plan above.   Taran Haynesworth C.  Randie Heinz, MD [facility] [place] Office: [phone] Pager: [phone]

## 2021-03-15 NOTE — Progress Notes (Signed)
PROGRESS NOTE    DIOSELINA [name]  [id number] DOB: [birth year] DOA: 03/10/2021 PCP: Wanita Chamberlain, PA-C   Chief Complain: Ulcer on the right foot, swelling  Brief Narrative: Patient is a 61 year old female with history of peripheral vascular disease with amputation of right fourth and fifth metatarsal, phalanges, diabetes type 2 hypertension, CKD stage IIIb who presented to the emergency department at [facility] with complaints of ulcer on the right foot and swelling.  She was following up with wound care.  She was complaining of pain with weightbearing.  X-ray of the right foot showed lytic destruction involving the fifth metatarsal cuboid and navicular bone consistent with osteomyelitis.  MRI showed advanced neuropathic foot consistent with osteomyelitis, developed complex fluid collection surrounding the midfoot with complex fluid in the extensor hallucis long tendon sheath suspicious for infectious tenosynovitis.  Patient was admitted for the management of osteomyelitis.  Transferred to [facility]. 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  PT/OT recommending home health on discharge.  Discharge planning tomorrow if vascular surgery clears   Assessment & Plan:   Principal Problem:   Osteomyelitis of right foot ([place]) Active Problems:   Hypertension   Arteriosclerotic cardiovascular disease (ASCVD)   Diabetes mellitus, type 2 ([place])   Diabetic foot ulcer ([place])   Acute osteomyelitis/chronic ulcer of right foot: Presented with chronic right foot  ulcer, pain, edema.  Hemodynamically stable on presentation, not septic.  X-ray was consistent with osteomyelitis showing likely destruction of fifth metatarsal, cuboid and navicular bones.  MRI consistent with osteomyelitis, infectious tenosynovitis,charcot  foot. Initially started on vancomycin, cefepime.  Blood cultures: NGTD. 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 Antibiotics has been changed to oral and will be  continued for 5 more days PT/OT recommending home health on discharge.  She will be discharged with wound VAC, planning for continued for a week.  She will follow-up with vascular surgery in 4 weeks for staple removal.  History of peripheral vascular disease: Was following with podiatry, vascular surgery as an outpatient.  Lower extremity arterial duplex of the right showed chronic occlusion noted in SFA and PTA that was seen on previous  exams. Stenosis noted in area of proximal anastomosis of bypass graft.   CKD stage IIIb: Currently kidney function at baseline  Normocytic anemia: Likely associated with CKD.  Iron studies showed low normal iron level  Chronic hyponatremia: Baseline sodium between 125-130.  Continue to monitor  Type 2 diabetes: On glipizide, metformin at home.  Continue sliding-scale insulin.  Hemoglobin A1c of 7.5  Hypertension: Continue lisinopril and metoprolol.  Monitor blood pressure  Obesity: BMI of 35         DVT prophylaxis:SCD Code Status: Full Family Communication: None at bedside Patient status:Inpatient  Dispo: The patient is from: Home              Anticipated d/c is to: Home with home health              Anticipated d/c date is: In next 1 to 2 days.  Needs PT/OT session again tomorrow, needs vascular surgery clearance  Consultants: Vascular surgery, orthopedics, ID  Procedures: None yet  Antimicrobials:  Anti-infectives (From admission, onward)    Start     Dose/Rate Route Frequency Ordered Stop   03/15/21 2200  cefdinir (OMNICEF) capsule 300 mg        300 mg Oral Every 12 hours 03/15/21 1106 03/20/21 0959   03/15/21 1200  doxycycline (VIBRA-TABS) tablet 100 mg  100 mg Oral Every 12 hours 03/15/21 1106 03/20/21 0959   03/12/21 1500  vancomycin (VANCOREADY) IVPB 1500 mg/300 mL  Status:  Discontinued       See Hyperspace for full Linked Orders Report.   1,500 mg 150 mL/hr over 120 Minutes Intravenous Every 24 hours 03/11/21 1441  03/15/21 1106   03/12/21 1000  ceFEPIme (MAXIPIME) 2 g in sodium chloride 0.9 % 100 mL IVPB  Status:  Discontinued        2 g 200 mL/hr over 30 Minutes Intravenous Every 12 hours 03/12/21 0830 03/15/21 1106   03/11/21 1500  vancomycin (VANCOREADY) IVPB 2000 mg/400 mL       See Hyperspace for full Linked Orders Report.   2,000 mg 200 mL/hr over 120 Minutes Intravenous  Once 03/11/21 1441 03/11/21 1845   03/10/21 1615  vancomycin (VANCOCIN) IVPB 1000 mg/200 mL premix        1,000 mg 200 mL/hr over 60 Minutes Intravenous  Once 03/10/21 1609 03/10/21 2201       Subjective:  Patient seen and examined at the bedside this morning.  No complaints.  Sitting in the chair.  Comfortable  Objective: Vitals:   03/14/21 1441 03/14/21 1504 03/14/21 2007 03/15/21 0819  BP: 119/68 111/60 (!) 107/55 (!) 124/54  Pulse: 61 60 82 (!) 51  Resp: 16 17 16 16   Temp: 97.9 F (36.6 C) 97.7 F (36.5 C) 97.8 F (36.6 C) 97.8 F (36.6 C)  TempSrc:  Oral Oral Oral  SpO2: 95% 94% 94% 97%  Weight:      Height:        Intake/Output Summary (Last 24 hours) at 03/15/2021 1148 Last data filed at 03/15/2021 [id number] Gross per 24 hour  Intake 500 ml  Output 910 ml  Net -410 ml   Filed Weights   03/10/21 1045  Weight: 96.6 kg    Examination:   General exam: Overall comfortable, not in distress,obese HEENT: PERRL Respiratory system:  no wheezes or crackles  Cardiovascular system: S1 & S2 heard, RRR.  Gastrointestinal system: Abdomen is nondistended, soft and nontender. Central nervous system: Alert and oriented Extremities: Right BKA, wound VAC, wound covered with dressing Skin: No rashes, no ulcers,no icterus    Data Reviewed: I have personally reviewed following labs and imaging studies  CBC: Recent Labs  Lab 03/10/21 1242 03/11/21 0010 03/12/21 0852 03/13/21 0311 03/14/21 0124 03/15/21 0222  WBC 11.1* 8.4 7.5 8.3 7.8 12.9*  NEUTROABS 8.5*  --  4.9 5.5 4.6 10.2*  HGB 9.0* 8.9* 8.8* 8.8*  8.3* 8.1*  HCT 30.4* 28.5* 27.7* 28.2* 27.3* 26.9*  MCV 93.3 92.8 89.9 90.4 91.0 92.1  PLT 516* 514* 473* 443* 418* 123XX123*   Basic Metabolic Panel: Recent Labs  Lab 03/11/21 0010 03/12/21 0852 03/13/21 0311 03/14/21 0124 03/15/21 0222  NA 129* 128* 130* 131* 130*  K 4.7 5.2* 5.2* 4.5 4.5  CL 96* 94* 99 99 99  CO2 26 25 23 23  19*  GLUCOSE 159* 208* 177* 171* 267*  BUN 28* 21* 21* 25* 26*  CREATININE 1.42* 1.26* 1.24* 1.21* 1.26*  CALCIUM 8.8* 9.1 8.7* 8.8* 8.8*   GFR: Estimated Creatinine Clearance: 55 mL/min (A) (by C-G formula based on SCr of 1.26 mg/dL (H)). Liver Function Tests: Recent Labs  Lab 03/11/21 0010  AST 61*  ALT 53*  ALKPHOS 127*  BILITOT 0.3  PROT 7.8  ALBUMIN 2.2*   No results for input(s): LIPASE, AMYLASE in the last 168 hours. No results for  input(s): AMMONIA in the last 168 hours. Coagulation Profile: No results for input(s): INR, PROTIME in the last 168 hours. Cardiac Enzymes: No results for input(s): CKTOTAL, CKMB, CKMBINDEX, TROPONINI in the last 168 hours. BNP (last 3 results) No results for input(s): PROBNP in the last 8760 hours. HbA1C: No results for input(s): HGBA1C in the last 72 hours.  CBG: Recent Labs  Lab 03/14/21 1612 03/14/21 2006 03/14/21 2344 03/15/21 0818 03/15/21 1135  GLUCAP 224* 419* 365* 199* 203*   Lipid Profile: No results for input(s): CHOL, HDL, LDLCALC, TRIG, CHOLHDL, LDLDIRECT in the last 72 hours. Thyroid Function Tests: No results for input(s): TSH, T4TOTAL, FREET4, T3FREE, THYROIDAB in the last 72 hours. Anemia Panel: No results for input(s): VITAMINB12, FOLATE, FERRITIN, TIBC, IRON, RETICCTPCT in the last 72 hours.  Sepsis Labs: No results for input(s): PROCALCITON, LATICACIDVEN in the last 168 hours.  Recent Results (from the past 240 hour(s))  Resp Panel by RT-PCR (Flu A&B, Covid) Nasopharyngeal Swab     Status: None   Collection Time: 03/10/21  2:57 PM   Specimen: Nasopharyngeal Swab;  Nasopharyngeal(NP) swabs in vial transport medium  Result Value Ref Range Status   SARS Coronavirus 2 by RT PCR NEGATIVE NEGATIVE Final    Comment: (NOTE) SARS-CoV-2 target nucleic acids are NOT DETECTED.  The SARS-CoV-2 RNA is generally detectable in upper respiratory specimens during the acute phase of infection. The lowest concentration of SARS-CoV-2 viral copies this assay can detect is 138 copies/mL. A negative result does not preclude SARS-Cov-2 infection and should not be used as the sole basis for treatment or other patient management decisions. A negative result may occur with  improper specimen collection/handling, submission of specimen other than nasopharyngeal swab, presence of viral mutation(s) within the areas targeted by this assay, and inadequate number of viral copies(<138 copies/mL). A negative result must be combined with clinical observations, patient history, and epidemiological information. The expected result is Negative.  Fact Sheet for Patients:  EntrepreneurPulse.com.au  Fact Sheet for Healthcare Providers:  IncredibleEmployment.be  This test is no t yet approved or cleared by the [place] FDA and  has been authorized for detection and/or diagnosis of SARS-CoV-2 by FDA under an Emergency Use Authorization (EUA). This EUA will remain  in effect (meaning this test can be used) for the duration of the COVID-19 declaration under Section 564(b)(1) of the Act, 21 U.S.C.section 360bbb-3(b)(1), unless the authorization is terminated  or revoked sooner.       Influenza A by PCR NEGATIVE NEGATIVE Final   Influenza B by PCR NEGATIVE NEGATIVE Final    Comment: (NOTE) The Xpert Xpress SARS-CoV-2/FLU/RSV plus assay is intended as an aid in the diagnosis of influenza from Nasopharyngeal swab specimens and should not be used as a sole basis for treatment. Nasal washings and aspirates are unacceptable for Xpert Xpress  SARS-CoV-2/FLU/RSV testing.  Fact Sheet for Patients: EntrepreneurPulse.com.au  Fact Sheet for Healthcare Providers: IncredibleEmployment.be  This test is not yet approved or cleared by the [place] FDA and has been authorized for detection and/or diagnosis of SARS-CoV-2 by FDA under an Emergency Use Authorization (EUA). This EUA will remain in effect (meaning this test can be used) for the duration of the COVID-19 declaration under Section 564(b)(1) of the Act, 21 U.S.C. section 360bbb-3(b)(1), unless the authorization is terminated or revoked.  Performed at [facility], [address], [place], [place] [address]   Culture, blood (routine x 2)     Status: None (Preliminary result)   Collection  Time: 03/11/21 12:10 AM   Specimen: Right Antecubital; Blood  Result Value Ref Range Status   Specimen Description RIGHT ANTECUBITAL  Final   Special Requests   Final    BOTTLES DRAWN AEROBIC AND ANAEROBIC Blood Culture adequate volume   Culture   Final    NO GROWTH 2 DAYS Performed at [facility], [address], [place], [place] [address]    Report Status PENDING  Incomplete  Culture, blood (routine x 2)     Status: None (Preliminary result)   Collection Time: 03/11/21 12:10 AM   Specimen: BLOOD RIGHT FOREARM  Result Value Ref Range Status   Specimen Description BLOOD RIGHT FOREARM  Final   Special Requests   Final    BOTTLES DRAWN AEROBIC AND ANAEROBIC Blood Culture results may not be optimal due to an excessive volume of blood received in culture bottles   Culture   Final    NO GROWTH 2 DAYS Performed at [facility], [address], [place], [place] [address]    Report Status PENDING  Incomplete  MRSA Next Gen by PCR, Nasal     Status: None   Collection Time: 03/12/21 11:15 AM   Specimen: Nasal Mucosa; Nasal Swab  Result Value Ref Range Status   MRSA by PCR Next Gen NOT DETECTED NOT DETECTED Final    Comment: (NOTE) The GeneXpert MRSA  Assay (FDA approved for NASAL specimens only), is one component of a comprehensive MRSA colonization surveillance program. It is not intended to diagnose MRSA infection nor to guide or monitor treatment for MRSA infections. Test performance is not FDA approved in patients less than 50 years old. Performed at [facility], [place] [address], [place], [place] [address]          Radiology Studies: No results found.      Scheduled Meds:  acetaminophen  650 mg Oral Q6H   aspirin EC  81 mg Oral Daily   atorvastatin  20 mg Oral QPM   cefdinir  300 mg Oral Q12H   citalopram  20 mg Oral Daily   docusate sodium  100 mg Oral Daily   doxycycline  100 mg Oral Q12H   heparin  5,000 Units Subcutaneous Q8H   insulin aspart  0-5 Units Subcutaneous QHS   insulin aspart  0-9 Units Subcutaneous TID WC   lisinopril  10 mg Oral Daily   metoprolol tartrate  25 mg Oral BID   pantoprazole  40 mg Oral Daily   Continuous Infusions:  magnesium sulfate bolus IVPB       LOS: 5 days         Shelly Coss, MD [facility] P1/28/2023, 11:48 AM

## 2021-03-15 NOTE — Evaluation (Signed)
Physical Therapy Evaluation Patient Details Name: Christy [name] MRN: [id number] DOB: Mar 10, 1960 Today's Date: 03/15/2021  History of Present Illness  Pt is a 61 y/o F presenting to ED on 1/23 for n/v and R foot wound infection. 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 PMH includes CKD, depression, CAD, HTN, DM2, and tobacco use.  Clinical Impression  Pt s/p R BKA. Overall, verbalizing excellent pain control and is mobilizing well post op. Pt requiring min guard-min assist for functional mobility. Hopping limited room distances with a walker. Initiated education regarding limb guard use, positioning, and desensitization techniques. Will perform stair training and continued education prior to d/c home.      Recommendations for follow up therapy are one component of a multi-disciplinary discharge planning process, led by the attending physician.  Recommendations may be updated based on patient status, additional functional criteria and insurance authorization.  Follow Up Recommendations Home health PT    Assistance Recommended at Discharge PRN  Patient can return home with the following  A little help with walking and/or transfers;A little help with bathing/dressing/bathroom;Help with stairs or ramp for entrance    Equipment Recommendations Other (comment) (tub bench)  Recommendations for Other Services       Functional Status Assessment Patient has had a recent decline in their functional status and demonstrates the ability to make significant improvements in function in a reasonable and predictable amount of time.     Precautions / Restrictions Precautions Precautions: Fall Required Braces or Orthoses: Other Brace Other Brace: R limb guard; wound vac Restrictions Weight Bearing Restrictions: Yes RLE Weight Bearing: Non weight bearing      Mobility  Bed Mobility Overal bed mobility: Needs Assistance Bed Mobility: Supine to Sit     Supine to sit:  Supervision          Transfers Overall transfer level: Needs assistance Equipment used: Rolling walker (2 wheels) Transfers: Sit to/from Stand Sit to Stand: Min guard, Min assist           General transfer comment: Pt initially requiring no physical assist to power up from edge of bed, cues for hand placement. From [facility], she required minA to steady as she was propping her right residual limb on the bed while she performed peri care    Ambulation/Gait Ambulation/Gait assistance: Min guard Gait Distance (Feet): 10 Feet Assistive device: Rolling walker (2 wheels) Gait Pattern/deviations: Step-to pattern Gait velocity: decreased     General Gait Details: Hop to pattern, decreased foot clearance, min guard for safety  Stairs            Wheelchair Mobility    Modified Rankin (Stroke Patients Only)       Balance Overall balance assessment: Needs assistance Sitting-balance support: Bilateral upper extremity supported Sitting balance-Leahy Scale: Good Sitting balance - Comments: sits unsupported EOB, mild unsteadiness when reaching outside BOS   Standing balance support: Reliant on assistive device for balance, During functional activity Standing balance-Leahy Scale: Poor Standing balance comment: poor static standing balance                             Pertinent Vitals/Pain Pain Assessment Pain Assessment: Faces Faces Pain Scale: Hurts a little bit Pain Location: RLE Pain Descriptors / Indicators: Discomfort Pain Intervention(s): Monitored during session    Home Living Family/patient expects to be discharged to:: Private residence Living Arrangements: Other relatives (nephew) Available Help at Discharge: Family;Available PRN/intermittently (  daughter, nephew) Type of Home: Mobile home Home Access: Stairs to enter Entrance Stairs-Rails: None Entrance Stairs-Number of Steps: 2 (short steps)   Home Layout: One level Home Equipment: Agricultural consultant  (2 wheels);BSC/3in1;Other (comment) (knee scooter) Additional Comments: Using bucket to sit on    Prior Function Prior Level of Function : Independent/Modified Independent                     Hand Dominance   Dominant Hand: Right    Extremity/Trunk Assessment   Upper Extremity Assessment Upper Extremity Assessment: Defer to OT evaluation    Lower Extremity Assessment Lower Extremity Assessment: RLE deficits/detail;LLE deficits/detail RLE Deficits / Details: s/p BKA, ROM WFL LLE Deficits / Details: strength 5/5    Cervical / Trunk Assessment Cervical / Trunk Assessment: Normal  Communication   Communication: No difficulties  Cognition Arousal/Alertness: Awake/alert Behavior During Therapy: WFL for tasks assessed/performed, Impulsive Overall Cognitive Status: Within Functional Limits for tasks assessed                                          General Comments General comments (skin integrity, edema, etc.): VSS on RA    Exercises     Assessment/Plan    PT Assessment Patient needs continued PT services  PT Problem List Decreased strength;Decreased mobility;Decreased balance       PT Treatment Interventions DME instruction;Gait training;Stair training;Functional mobility training;Therapeutic exercise;Therapeutic activities;Balance training;Patient/family education    PT Goals (Current goals can be found in the Care Plan section)  Acute Rehab PT Goals Patient Stated Goal: "go shopping" PT Goal Formulation: With patient Time For Goal Achievement: 03/29/21 Potential to Achieve Goals: Good    Frequency Min 3X/week     Co-evaluation               AM-PAC PT "6 Clicks" Mobility  Outcome Measure Help needed turning from your back to your side while in a flat bed without using bedrails?: None Help needed moving from lying on your back to sitting on the side of a flat bed without using bedrails?: A Little Help needed moving to and from a  bed to a chair (including a wheelchair)?: A Little Help needed standing up from a chair using your arms (e.g., wheelchair or bedside chair)?: A Little Help needed to walk in hospital room?: A Little Help needed climbing 3-5 steps with a railing? : A Lot 6 Click Score: 18    End of Session Equipment Utilized During Treatment: Gait belt Activity Tolerance: Patient tolerated treatment well Patient left: in chair;with call bell/phone within reach;with chair alarm set Nurse Communication: Mobility status PT Visit Diagnosis: Unsteadiness on feet (R26.81);Difficulty in walking, not elsewhere classified (R26.2)    Time: 5366-4403 PT Time Calculation (min) (ACUTE ONLY): 13 min   Charges:   PT Evaluation $PT Eval Low Complexity: 1 Low          Lillia Pauls, PT, DPT Acute Rehabilitation Services Pager [phone] Office [phone]   Norval Morton 03/15/2021, 11:27 AM

## 2021-03-15 NOTE — Progress Notes (Signed)
Inpatient Rehab Admissions Coordinator:  Consult received. Note PT/OT recommending HH therapy. It appears the intensity of CIR is not warranted. AC will sign off.   Wolfgang Phoenix, MS, CCC-SLP Admissions Coordinator [phone]

## 2021-03-15 NOTE — Evaluation (Signed)
Occupational Therapy Evaluation Patient Details Name: Christy [name] MRN: [id number] DOB: [birth year] Today's Date: 03/15/2021   History of Present Illness Pt is a 61 y/o F presenting to ED on 1/23 for n/v and R foot wound infection. 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 PMH includes CKD, depression, CAD, HTN, DM2, and tobacco use.   Clinical Impression   Pt reports independence at baseline with ADLs and functional mobility, lives at home with family who are able to provide PRN assist at d/c. Pt currently min guard-min A for ADLs, supervision for bed mobility, and min guard-min A for transfers. Pt displaying mild impulsivity requiring safety cues and cues for hand placement on RW/bed during session. Pt educated on limb desensitization strategies to help with phantom pain/sensations, pt verbalized understanding. Pt presenting with impairments listed below, will  follow acutely. Recommend d/c home with HHOT pending pt progress.      Recommendations for follow up therapy are one component of a multi-disciplinary discharge planning process, led by the attending physician.  Recommendations may be updated based on patient status, additional functional criteria and insurance authorization.   Follow Up Recommendations  Home health OT    Assistance Recommended at Discharge Set up Supervision/Assistance  Patient can return home with the following A little help with walking and/or transfers;A little help with bathing/dressing/bathroom;Assistance with cooking/housework;Help with stairs or ramp for entrance;Assist for transportation    Functional Status Assessment  Patient has had a recent decline in their functional status and demonstrates the ability to make significant improvements in function in a reasonable and predictable amount of time.  Equipment Recommendations  Tub/shower seat    Recommendations for Other Services PT consult     Precautions / Restrictions  Precautions Precautions: Fall Restrictions Weight Bearing Restrictions: Yes RLE Weight Bearing: Non weight bearing      Mobility Bed Mobility Overal bed mobility: Needs Assistance Bed Mobility: Supine to Sit     Supine to sit: Supervision          Transfers Overall transfer level: Needs assistance Equipment used: Rolling walker (2 wheels) Transfers: Sit to/from Stand Sit to Stand: Min guard, Min assist           General transfer comment: occasional unsteadiness requiring min A      Balance Overall balance assessment: Needs assistance Sitting-balance support: Bilateral upper extremity supported Sitting balance-Leahy Scale: Good Sitting balance - Comments: sits unsupported EOB, mild unsteadiness when reaching outside BOS   Standing balance support: Reliant on assistive device for balance, During functional activity Standing balance-Leahy Scale: Poor Standing balance comment: poor static standing balance                           ADL either performed or assessed with clinical judgement   ADL Overall ADL's : Needs assistance/impaired Eating/Feeding: Independent;Sitting   Grooming: Independent;Sitting   Upper Body Bathing: Min guard;Sitting   Lower Body Bathing: Minimal assistance;Sitting/lateral leans;Moderate assistance   Upper Body Dressing : Min guard;Sitting   Lower Body Dressing: Minimal assistance;Sit to/from stand;Moderate assistance Lower Body Dressing Details (indicate cue type and reason): able to simulate donning socks sitting EOB Toilet Transfer: Minimal assistance;BSC/3in1;Stand-pivot;Rolling walker (2 wheels)   Toileting- Clothing Manipulation and Hygiene: Min guard;Sit to/from stand Toileting - Clothing Manipulation Details (indicate cue type and reason): completes pericare in standing with RLE propped up on bed for support     Functional mobility during ADLs: Min guard;Rolling walker (  2 wheels);Cueing for sequencing;Cueing for  safety       Vision   Vision Assessment?: No apparent visual deficits     Perception     Praxis      Pertinent Vitals/Pain Pain Assessment Pain Assessment: Faces Pain Score: 3  Faces Pain Scale: Hurts a little bit Pain Location: RLE Pain Descriptors / Indicators: Discomfort Pain Intervention(s): Limited activity within patient's tolerance, Monitored during session, Premedicated before session, Repositioned     Hand Dominance Right   Extremity/Trunk Assessment Upper Extremity Assessment Upper Extremity Assessment: Overall WFL for tasks assessed   Lower Extremity Assessment Lower Extremity Assessment: Defer to PT evaluation   Cervical / Trunk Assessment Cervical / Trunk Assessment: Normal   Communication Communication Communication: No difficulties   Cognition Arousal/Alertness: Awake/alert Behavior During Therapy: WFL for tasks assessed/performed, Impulsive Overall Cognitive Status: Within Functional Limits for tasks assessed                                       General Comments  VSS on RA    Exercises     Shoulder Instructions      Home Living Family/patient expects to be discharged to:: Private residence Living Arrangements: Other relatives (nephew) Available Help at Discharge: Family;Available PRN/intermittently (daughter, nephew) Type of Home: Mobile home Home Access: Stairs to enter Entrance Stairs-Number of Steps: 2 (short steps) Entrance Stairs-Rails: None Home Layout: One level     Bathroom Shower/Tub: Tub/shower unit         Home Equipment: Agricultural consultant (2 wheels);BSC/3in1;Other (comment) (knee scooter)   Additional Comments: Using bucket to sit on      Prior Functioning/Environment Prior Level of Function : Independent/Modified Independent                        OT Problem List: Decreased strength;Decreased range of motion;Decreased activity tolerance;Impaired balance (sitting and/or standing);Decreased  safety awareness;Decreased knowledge of use of DME or AE      OT Treatment/Interventions: Self-care/ADL training;Therapeutic exercise;Patient/family education;Balance training;Therapeutic activities    OT Goals(Current goals can be found in the care plan section) Acute Rehab OT Goals Patient Stated Goal: to go shopping OT Goal Formulation: With patient Time For Goal Achievement: 03/29/21 Potential to Achieve Goals: Good ADL Goals Pt Will Perform Lower Body Dressing: with supervision;sit to/from stand Pt Will Transfer to Toilet: with supervision;stand pivot transfer;bedside commode Pt Will Perform Tub/Shower Transfer: with min guard assist;Stand pivot transfer;shower seat  OT Frequency: Min 3X/week    Co-evaluation              AM-PAC OT "6 Clicks" Daily Activity     Outcome Measure Help from another person eating meals?: None Help from another person taking care of personal grooming?: None Help from another person toileting, which includes using toliet, bedpan, or urinal?: A Little Help from another person bathing (including washing, rinsing, drying)?: A Lot Help from another person to put on and taking off regular upper body clothing?: None Help from another person to put on and taking off regular lower body clothing?: A Lot 6 Click Score: 19   End of Session Equipment Utilized During Treatment: Rolling walker (2 wheels);Gait belt Nurse Communication: Mobility status  Activity Tolerance: Patient tolerated treatment well Patient left: in chair;with call bell/phone within reach;with chair alarm set  OT Visit Diagnosis: Unsteadiness on feet (R26.81);Other abnormalities of gait and mobility (R26.89);Muscle weakness (  generalized) (M62.81)                Time: 9622-2979 OT Time Calculation (min): 13 min Charges:     Alfonzo Beers, OTD, OTR/L Acute Rehab [phone]   Mayer Masker 03/15/2021, 10:45 AM

## 2021-03-16 ENCOUNTER — Encounter (HOSPITAL_COMMUNITY): Payer: Self-pay | Admitting: Vascular Surgery

## 2021-03-16 DIAGNOSIS — M869 Osteomyelitis, unspecified: Secondary | ICD-10-CM | POA: Diagnosis not present

## 2021-03-16 LAB — BASIC METABOLIC PANEL
Anion gap: 9 (ref 5–15)
BUN: 24 mg/dL — ABNORMAL HIGH (ref 6–20)
CO2: 21 mmol/L — ABNORMAL LOW (ref 22–32)
Calcium: 8.3 mg/dL — ABNORMAL LOW (ref 8.9–10.3)
Chloride: 101 mmol/L (ref 98–111)
Creatinine, Ser: 1.05 mg/dL — ABNORMAL HIGH (ref 0.44–1.00)
GFR, Estimated: >60 mL/min (ref >60)
Glucose, Bld: 162 mg/dL — ABNORMAL HIGH (ref 70–99)
Potassium: 4.8 mmol/L (ref 3.5–5.1)
Sodium: 131 mmol/L — ABNORMAL LOW (ref 135–145)

## 2021-03-16 LAB — CULTURE, BLOOD (ROUTINE X 2)
Culture: NO GROWTH
Culture: NO GROWTH
Special Requests: ADEQUATE

## 2021-03-16 LAB — GLUCOSE, CAPILLARY: Glucose-Capillary: 133 mg/dL — ABNORMAL HIGH (ref 70–99)

## 2021-03-16 MED ORDER — OXYCODONE HCL 5 MG PO TABS: 5.0000 mg | ORAL_TABLET | Freq: Four times a day (QID) | ORAL | 0 refills | Status: DC | PRN

## 2021-03-16 MED ORDER — CEFDINIR 300 MG PO CAPS: 300.0000 mg | ORAL_CAPSULE | Freq: Two times a day (BID) | ORAL | 0 refills | Status: AC

## 2021-03-16 MED ORDER — VITAMIN D (CHOLECALCIFEROL) 25 MCG (1000 UT) PO TABS: 1000.0000 [IU] | ORAL_TABLET | Freq: Every day | ORAL | 0 refills | Status: AC

## 2021-03-16 MED ORDER — DOXYCYCLINE HYCLATE 100 MG PO TABS: 100.0000 mg | ORAL_TABLET | Freq: Two times a day (BID) | ORAL | 0 refills | Status: AC

## 2021-03-16 NOTE — Progress Notes (Signed)
°  Progress Note    03/16/2021 9:59 AM 2 Days Post-Op  Subjective: Patient is upset with her pain management here in the hospital  Vitals:   03/15/21 0819 03/15/21 1958  BP: (!) 124/54 (!) 126/56  Pulse: (!) 51 64  Resp: 16 18  Temp: 97.8 F (36.6 C) 98.4 F (36.9 C)  SpO2: 97% 100%    Physical Exam: Awake alert oriented Right below-knee amputation site with wound VAC in place and shrinker sock  CBC    Component Value Date/Time   WBC 12.9 (H) 03/15/2021 0222   RBC 2.92 (L) 03/15/2021 0222   HGB 8.1 (L) 03/15/2021 0222   HCT 26.9 (L) 03/15/2021 0222   PLT 481 (H) 03/15/2021 0222   MCV 92.1 03/15/2021 0222   MCH 27.7 03/15/2021 0222   MCHC 30.1 03/15/2021 0222   RDW 15.9 (H) 03/15/2021 0222   LYMPHSABS 1.5 03/15/2021 0222   MONOABS 0.7 03/15/2021 0222   EOSABS 0.0 03/15/2021 0222   BASOSABS 0.1 03/15/2021 0222    BMET    Component Value Date/Time   NA 131 (L) 03/16/2021 0122   K 4.8 03/16/2021 0122   CL 101 03/16/2021 0122   CO2 21 (L) 03/16/2021 0122   GLUCOSE 162 (H) 03/16/2021 0122   BUN 24 (H) 03/16/2021 0122   CREATININE 1.05 (H) 03/16/2021 0122   CREATININE 1.16 (H) 07/14/2011 0921   CALCIUM 8.3 (L) 03/16/2021 0122   GFRNONAA >60 03/16/2021 0122   GFRAA 59 (L) 10/20/2013 0312    INR    Component Value Date/Time   INR 0.99 10/13/2013 1456     Intake/Output Summary (Last 24 hours) at 03/16/2021 0959 Last data filed at 03/16/2021 0551 Gross per 24 hour  Intake 255 ml  Output 490 ml  Net -235 ml     Assessment:  61 y.o. female is s/p right below-knee amputation with wound VAC in place  Plan: Okay for discharge from vascular standpoint  I have recommended limb guard when she is out of bed and otherwise exercises to maintain knee mobility.  She will follow-up in 1 to 2 weeks in our office for wound check, wound VAC can be removed in 1 week   Katherinne Mofield C. Randie Heinz, MD [facility] Office: [phone] Pager:  [phone]  03/16/2021 9:59 AM

## 2021-03-16 NOTE — Progress Notes (Signed)
Physical Therapy Treatment Patient Details Name: Christy [name] MRN: [id number] DOB: 1960-12-01 Today's Date: 03/16/2021   History of Present Illness Pt is a 62 y/o F presenting to ED on 1/23 for n/v and R foot wound infection. 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 PMH includes CKD, depression, CAD, HTN, DM2, and tobacco use.    PT Comments    Pt progressing towards her physical therapy goals. Session focused on reviewing and performing HEP for right residual limb strengthening (HEP provided) and stair training. Pt negotiated 1 step with a walker to simulate home set up. D/c plan remains appropriate.     Recommendations for follow up therapy are one component of a multi-disciplinary discharge planning process, led by the attending physician.  Recommendations may be updated based on patient status, additional functional criteria and insurance authorization.  Follow Up Recommendations  Home health PT     Assistance Recommended at Discharge PRN  Patient can return home with the following A little help with walking and/or transfers;A little help with bathing/dressing/bathroom;Help with stairs or ramp for entrance   Equipment Recommendations  Other (comment) (tub bench)    Recommendations for Other Services       Precautions / Restrictions Precautions Precautions: Fall Required Braces or Orthoses: Other Brace Other Brace: R limb guard Restrictions Weight Bearing Restrictions: Yes RLE Weight Bearing: Non weight bearing     Mobility  Bed Mobility Overal bed mobility: Modified Independent                  Transfers Overall transfer level: Needs assistance Equipment used: Rolling walker (2 wheels) Transfers: Sit to/from Stand Sit to Stand: Min guard                Ambulation/Gait Ambulation/Gait assistance: Min Web designer (Feet): 5 Feet Assistive device: Rolling walker (2 wheels)         General Gait Details: Pivotal  hops from bed > recliner and then recliner <> stairs. Pt needs cues to slow down as she becomes unsteady when rushing, requiring minA for balance   Stairs Stairs: Yes Stairs assistance: Min assist Stair Management: Backwards, With walker Number of Stairs: 1 General stair comments: Pt hopping up one stair with walker backwards and descended forwards with minA for balance. Cues for sequencing/technique   Wheelchair Mobility    Modified Rankin (Stroke Patients Only)       Balance Overall balance assessment: Needs assistance Sitting-balance support: Bilateral upper extremity supported Sitting balance-Leahy Scale: Good     Standing balance support: Reliant on assistive device for balance, During functional activity Standing balance-Leahy Scale: Poor Standing balance comment: poor static standing balance                            Cognition Arousal/Alertness: Awake/alert Behavior During Therapy: Flat affect, Agitated, Impulsive Overall Cognitive Status: Within Functional Limits for tasks assessed                                          Exercises Amputee Exercises Hip ABduction/ADduction: Right, 10 reps, Sidelying Knee Flexion: Right, 15 reps, Supine Knee Extension: Right, 15 reps, Seated Straight Leg Raises: Right, 15 reps, Supine    General Comments        Pertinent Vitals/Pain Pain Assessment Pain Assessment: No/denies pain    Home  Living                          Prior Function            PT Goals (current goals can now be found in the care plan section) Acute Rehab PT Goals Potential to Achieve Goals: Good Progress towards PT goals: Progressing toward goals    Frequency    Min 3X/week      PT Plan Current plan remains appropriate    Co-evaluation              AM-PAC PT "6 Clicks" Mobility   Outcome Measure  Help needed turning from your back to your side while in a flat bed without using bedrails?:  None Help needed moving from lying on your back to sitting on the side of a flat bed without using bedrails?: None Help needed moving to and from a bed to a chair (including a wheelchair)?: A Little Help needed standing up from a chair using your arms (e.g., wheelchair or bedside chair)?: A Little Help needed to walk in hospital room?: A Little Help needed climbing 3-5 steps with a railing? : A Little 6 Click Score: 20    End of Session Equipment Utilized During Treatment: Gait belt Activity Tolerance: Patient tolerated treatment well Patient left: in bed;with call bell/phone within reach Nurse Communication: Mobility status PT Visit Diagnosis: Unsteadiness on feet (R26.81);Difficulty in walking, not elsewhere classified (R26.2)     Time: 1131-1200 PT Time Calculation (min) (ACUTE ONLY): 29 min  Charges:  $Gait Training: 8-22 mins $Therapeutic Exercise: 8-22 mins                     Wyona Almas, PT, DPT Acute Rehabilitation Services Pager [phone] Office [phone]    Deno Etienne 03/16/2021, 3:21 PM

## 2021-03-16 NOTE — Progress Notes (Signed)
Occupational Therapy Treatment Patient Details Name: Christy [name] MRN: [id number] DOB: 03-24-1960 Today's Date: 03/16/2021   History of present illness Pt is a 61 y/o F presenting to ED on 1/23 for n/v and R foot wound infection. 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 PMH includes CKD, depression, CAD, HTN, DM2, and tobacco use.   OT comments  Pt progressing well towards goals, requiring min guard A for ADLs and transfers, supervision for bed mobility. Currently pt agitated with pain management while admitted, reporting increased pain with mobility. Per pt eval chart review/pt report she currently has a 5 gallon bucket she uses as a seat in the shower, recommend a tub bench for increased safety when bathing/tub transfer. Pt presenting with impairments listed below, will follow acutely. Recommend d/c home with [facility].   Recommendations for follow up therapy are one component of a multi-disciplinary discharge planning process, led by the attending physician.  Recommendations may be updated based on patient status, additional functional criteria and insurance authorization.    Follow Up Recommendations  Home health OT    Assistance Recommended at Discharge Set up Supervision/Assistance  Patient can return home with the following  A little help with walking and/or transfers;A little help with bathing/dressing/bathroom;Assistance with cooking/housework;Help with stairs or ramp for entrance;Assist for transportation   Equipment Recommendations  Tub/shower seat    Recommendations for Other Services PT consult    Precautions / Restrictions Precautions Precautions: Fall Required Braces or Orthoses: Other Brace Other Brace: R limb guard Restrictions Weight Bearing Restrictions: Yes RLE Weight Bearing: Non weight bearing       Mobility Bed Mobility Overal bed mobility: Needs Assistance Bed Mobility: Supine to Sit     Supine to sit: Supervision           Transfers Overall transfer level: Needs assistance Equipment used: Rolling walker (2 wheels) Transfers: Sit to/from Stand Sit to Stand: Min guard, Min assist                 Balance Overall balance assessment: Needs assistance Sitting-balance support: Bilateral upper extremity supported Sitting balance-Leahy Scale: Good     Standing balance support: Reliant on assistive device for balance, During functional activity Standing balance-Leahy Scale: Poor Standing balance comment: poor static standing balance                           ADL either performed or assessed with clinical judgement   ADL Overall ADL's : Needs assistance/impaired                         Toilet Transfer: Min guard;Ambulation;Regular Toilet;Rolling walker (2 wheels) Toilet Transfer Details (indicate cue type and reason): benefits from use of grab bars Toileting- Clothing Manipulation and Hygiene: Min guard;Sit to/from stand Toileting - Clothing Manipulation Details (indicate cue type and reason): able to manage clothing prior to sitting on commode     Functional mobility during ADLs: Min guard;Rolling walker (2 wheels);Cueing for sequencing;Cueing for safety      Extremity/Trunk Assessment Upper Extremity Assessment Upper Extremity Assessment: Overall WFL for tasks assessed   Lower Extremity Assessment Lower Extremity Assessment: Defer to PT evaluation        Vision   Vision Assessment?: No apparent visual deficits   Perception     Praxis      Cognition Arousal/Alertness: Awake/alert Behavior During Therapy: Flat affect, Agitated, Impulsive Overall Cognitive Status: Within  Functional Limits for tasks assessed                                 General Comments: upset due to her pain being managed poorly while admitted        Exercises      Shoulder Instructions       General Comments family member present during session    Pertinent Vitals/  Pain       Pain Assessment Pain Assessment: No/denies pain  Home Living                                          Prior Functioning/Environment              Frequency  Min 3X/week        Progress Toward Goals  OT Goals(current goals can now be found in the care plan section)  Progress towards OT goals: Progressing toward goals  Acute Rehab OT Goals Patient Stated Goal: manage pain OT Goal Formulation: With patient Time For Goal Achievement: 03/29/21 Potential to Achieve Goals: Good ADL Goals Pt Will Perform Lower Body Dressing: with supervision;sit to/from stand Pt Will Transfer to Toilet: with supervision;stand pivot transfer;bedside commode Pt Will Perform Tub/Shower Transfer: with min guard assist;Stand pivot transfer;shower seat  Plan Discharge plan remains appropriate;Frequency remains appropriate    Co-evaluation                 AM-PAC OT "6 Clicks" Daily Activity     Outcome Measure   Help from another person eating meals?: None Help from another person taking care of personal grooming?: None Help from another person toileting, which includes using toliet, bedpan, or urinal?: A Little Help from another person bathing (including washing, rinsing, drying)?: A Lot Help from another person to put on and taking off regular upper body clothing?: None Help from another person to put on and taking off regular lower body clothing?: A Lot 6 Click Score: 19    End of Session Equipment Utilized During Treatment: Rolling walker (2 wheels);Gait belt  OT Visit Diagnosis: Unsteadiness on feet (R26.81);Other abnormalities of gait and mobility (R26.89);Muscle weakness (generalized) (M62.81)   Activity Tolerance Patient tolerated treatment well   Patient Left in bed;with call bell/phone within reach;with family/visitor present (sitting EOB)   Nurse Communication Mobility status        Time: [id number] OT Time Calculation (min): 12  min  Charges: OT General Charges $OT Visit: 1 Visit OT Treatments $Self Care/Home Management : 8-22 mins  Lynnda Child, OTD, OTR/L Acute Rehab [phone] - [place] 03/16/2021, 12:05 PM

## 2021-03-16 NOTE — Discharge Summary (Signed)
Physician Discharge Summary  Christy [name] [id number] DOB: November 16, 1960 DOA: 03/10/2021  PCP: Wanita Chamberlain, PA-C  Admit date: 03/10/2021 Discharge date: 03/16/2021  Admitted From: Home Disposition:  Home  Discharge Condition:Stable CODE STATUS:FULL Diet recommendation: Heart Healthy    Brief/Interim Summary: Patient is a 61 year old female with history of peripheral vascular disease with amputation of right fourth and fifth metatarsal, phalanges, diabetes type 2 hypertension, CKD stage IIIb who presented to the emergency department at [facility] with complaints of ulcer on the right foot and swelling.  She was following up with wound care.  She was complaining of pain with weightbearing.  X-ray of the right foot showed lytic destruction involving the fifth metatarsal cuboid and navicular bone consistent with osteomyelitis.  MRI showed advanced neuropathic foot consistent with osteomyelitis, developed complex fluid collection surrounding the midfoot with complex fluid in the extensor hallucis long tendon sheath suspicious for infectious tenosynovitis.  Patient was admitted for the management of osteomyelitis.  Transferred to [facility]. 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  PT/OT recommending home health on discharge.  Discharge planning today to home.  She will follow-up with vascular surgery in a week for wound check and wound VAC removal.  Following problems were addressed during her hospitalization:  Acute osteomyelitis/chronic ulcer of right foot: Presented with chronic right foot  ulcer, pain, edema.  Hemodynamically stable on presentation, not septic.  X-ray was consistent with osteomyelitis showing likely destruction of fifth metatarsal, cuboid and navicular bones.  MRI consistent with osteomyelitis, infectious tenosynovitis,charcot  foot. Initially started on vancomycin, cefepime.  Blood cultures: NGTD. 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 Antibiotics  has been changed to oral and will be continued for 4 more days PT/OT recommending home health on discharge.  She will be discharged with wound VAC, planning for continued for a week.  She will follow-up with vascular surgery in a week   History of peripheral vascular disease: Was following with podiatry, vascular surgery as an outpatient.  Lower extremity arterial duplex of the right showed chronic occlusion noted in SFA and PTA that was seen on previous  exams. Stenosis noted in area of proximal anastomosis of bypass graft.    CKD stage IIIb: Currently kidney function at baseline   Normocytic anemia: Likely associated with CKD.  Iron studies showed low normal iron level   Chronic hyponatremia: Baseline sodium between 125-130.  Continue to monitor.Stable   Type 2 diabetes: On glipizide, metformin at home.  Continue sliding-scale insulin.  Hemoglobin A1c of 7.5   Hypertension: Continue lisinopril and metoprolol.  Monitor blood pressure   Obesity: BMI of 35  Discharge Diagnoses:  Principal Problem:   Osteomyelitis of right foot ([place]) Active Problems:   Hypertension   Arteriosclerotic cardiovascular disease (ASCVD)   Diabetes mellitus, type 2 ([place])   Diabetic foot ulcer ([place])    Discharge Instructions  Discharge Instructions     Diet - low sodium heart healthy   Complete by: As directed    Discharge instructions   Complete by: As directed    1)Please take prescribed medications as instructed 2)Follow up with vascular surgery as an outpatient in a week for wound check and wound VAC removal. 3)Follow up with home health   Discharge wound care:   Complete by: As directed    As per wound nurse   Increase activity slowly   Complete by: As directed       Allergies as of 03/16/2021  Reactions   Alpha-gal Anaphylaxis   RED MEAT ALLERGY   Bactrim Diarrhea   Isosorbide Mononitrate Other (See Comments)   Made patient feel as though she was about to have another MI    Keflex [cephalexin] Other (See Comments)   Yeast infection        Medication List     STOP taking these medications    cefpodoxime 200 MG tablet Commonly known as: VANTIN   doxepin 25 MG capsule Commonly known as: SINEQUAN   lithium 300 MG tablet       TAKE these medications    aspirin EC 81 MG tablet Take 81 mg by mouth daily.   atorvastatin 20 MG tablet Commonly known as: LIPITOR Take 20 mg by mouth every evening. What changed: Another medication with the same name was removed. Continue taking this medication, and follow the directions you see here.   cefdinir 300 MG capsule Commonly known as: OMNICEF Take 1 capsule (300 mg total) by mouth every 12 (twelve) hours for 4 days.   citalopram 20 MG tablet Commonly known as: CELEXA Take 20 mg by mouth daily.   doxycycline 100 MG tablet Commonly known as: VIBRA-TABS Take 1 tablet (100 mg total) by mouth every 12 (twelve) hours for 4 days.   fish oil-omega-3 fatty acids 1000 MG capsule Take 3 g by mouth daily.   glipiZIDE 5 MG tablet Commonly known as: GLUCOTROL Take 5 mg by mouth 2 (two) times daily before a meal.   lisinopril 10 MG tablet Commonly known as: ZESTRIL Take 10 mg by mouth daily.   metFORMIN 500 MG tablet Commonly known as: GLUCOPHAGE Take 500 mg by mouth 2 (two) times daily with a meal.   metoprolol tartrate 25 MG tablet Commonly known as: LOPRESSOR Take 1 tablet (25 mg total) by mouth 2 (two) times daily.   niacin 500 MG CR capsule Take 1 capsule (500 mg total) by mouth at bedtime.   nitroGLYCERIN 0.4 MG SL tablet Commonly known as: NITROSTAT Place 1 tablet (0.4 mg total) under the tongue every 5 (five) minutes as needed.   oxyCODONE 5 MG immediate release tablet Commonly known as: Roxicodone Take 1 tablet (5 mg total) by mouth every 6 (six) hours as needed for severe pain.   Vitamin D (Cholecalciferol) 25 MCG (1000 UT) Tabs Take 1,000 Units by mouth daily. What changed:   medication strength how much to take               Discharge Care Instructions  (From admission, onward)           Start     Ordered   03/16/21 0000  Discharge wound care:       Comments: As per wound nurse   03/16/21 1124            Follow-up Information     Boles, Alyssa H, PA-C Follow up.   Specialty: Physician Assistant Contact information: [place] [address] [phone]         [facility] Follow up.   Specialty: [place] Why: the office will call to schedule home health visits Contact information: [place] [address] [phone]         Waynetta Sandy, MD. Schedule an appointment as soon as possible for a visit in 1 week(s).   Specialties: Vascular Surgery, Cardiology Contact information: [address] [place] [address] [phone]  Allergies  Allergen Reactions   Alpha-Gal Anaphylaxis    RED MEAT ALLERGY   Bactrim Diarrhea   Isosorbide Mononitrate Other (See Comments)    Made patient feel as though she was about to have another MI   Keflex [Cephalexin] Other (See Comments)    Yeast infection    Consultations: Vascular surgery   Procedures/Studies: MR FOOT RIGHT WO CONTRAST  Result Date: 03/11/2021 CLINICAL DATA:  Diabetic with foot swelling. Osteomyelitis suspected. Previous amputations to 4th and 5th metatarsals. Open wounds. EXAM: MRI OF THE RIGHT FOREFOOT WITHOUT CONTRAST TECHNIQUE: Multiplanar, multisequence MR imaging of the right forefoot was performed. No intravenous contrast was administered. COMPARISON:  Right foot radiographs 03/10/2021 and 03/23/2013. FINDINGS: Bones/Joint/Cartilage Study includes the forefoot and midfoot. The hindfoot and ankle are not included. In correlation with prior radiographs, patient is status post remote amputation through the base of the 5th metatarsal and more recent amputation through the mid 4th  metatarsal. There is gross fragmentation, destruction and marrow T2 hyperintensity throughout the cuneiform bones, cuboid, navicular and bases of all the metatarsals which are posteriorly dislocated, findings most consistent with advanced neuropathic joint (Charcot) changes. 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 The marrow edema within the 3rd metatarsal extends into the distal shaft. The remaining toes are intact. There is nonspecific low-level marrow T2 hyperintensity within the visualized talar head and medial malleolus. Ligaments Fracture dislocation at the Lisfranc joint. The collateral ligaments of the remaining metatarsophalangeal joints appear grossly intact. Muscles and Tendons There is a large amount of complex fluid within the extensor hallucis longus tendon, suspicious for infectious tenosynovitis. No gross flexor tendon abnormality identified. Nonspecific T2 hyperintensity throughout the forefoot musculature. Soft tissues There is apparent soft tissue ulceration along the plantar aspect of the remaining 4th metatarsal, and there is a complex fluid collection surrounding the 4th metatarsal, measuring up to 3.3 x 2.0 cm transverse on image 32/11. Multiple other complex fluid collections are present within the midfoot, most notable medially. No other discrete areas of skin ulceration are identified. IMPRESSION: 1. 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 2. In the setting of advanced neuropathic changes, assessment for osteomyelitis is limited. However, given apparent skin ulceration along the plantar aspect of the remaining distal 4th metatarsal and adjacent complex fluid, the findings are suspicious for superimposed osteomyelitis. Of note, the proximal extent is difficult to assess on this examination of the forefoot. If additional amputation is planned, consider  additional examination of the hindfoot/ankle. 3. Multiple complex fluid collections surrounding the midfoot with complex fluid in the extensor hallucis longus tendon sheath, suspicious for infectious tenosynovitis. Electronically Signed   By: Richardean Sale M.D.   On: 03/11/2021 08:52   US ARTERIAL ABI (SCREENING LOWER EXTREMITY)  Result Date: 03/11/2021 CLINICAL DATA:  Right plantar foot wound x1 year, redness and edema x1 month, previous fourth and fifth toe amputation, previous fem-pop bypass. EXAM: NONINVASIVE PHYSIOLOGIC VASCULAR STUDY OF BILATERAL LOWER EXTREMITIES TECHNIQUE: Evaluation of both lower extremities were performed at rest, including calculation of ankle-brachial indices with single level Doppler, pressure and pulse volume recording. COMPARISON:  01/24/2014 FINDINGS: Right ABI:  0.72 (previously 0.87) Left ABI:  0.50 (previously 0.36) Right Lower Extremity:  Multiphasic arterial waveforms at the ankle. Left Lower Extremity:  Monophasic arterial waveforms at the ankle. IMPRESSION: 1. Moderately severe bilateral lower extremity arterial occlusive disease at rest, slightly worse on the right and improved  on the left since prior study. Electronically Signed   By: Lucrezia Europe M.D.   On: 03/11/2021 10:46   DG Foot Complete Right  Result Date: 03/10/2021 CLINICAL DATA:  Diabetic foot wound without known injury. EXAM: RIGHT FOOT COMPLETE - 3+ VIEW COMPARISON:  March 23, 2013. FINDINGS: Status post amputation involving the fourth and fifth metatarsals and phalanges. Diffuse soft tissue swelling is noted concerning for cellulitis. Lytic destruction involving the proximal portion of the first metatarsal is noted with pathologic fracture. Lytic destruction is also seen involving the cuboid bone with pathologic fracture. Lytic destruction of the navicular bone is also noted. Dislocation of the second and third metatarsals is noted as well. IMPRESSION: Lytic destruction is seen involving the first  metatarsal, cuboid and navicular bones consistent with osteomyelitis. Status post amputation of the fourth and fifth metatarsals and phalanges is noted. Electronically Signed   By: Marijo Conception M.D.   On: 03/10/2021 12:56   VAS [place] LOWER EXTREMITY ARTERIAL DUPLEX  Result Date: 03/12/2021 LOWER EXTREMITY ARTERIAL DUPLEX STUDY Patient Name:  EMILYELIZABETH BATTISTONE  Date of Exam:   03/12/2021 Medical Rec #: [id number]       Accession #:    [id number] Date of Birth: [birth year]       Patient Gender: F Patient Age:   82 years Exam Location:  [facility] Procedure:      VAS [place] LOWER EXTREMITY ARTERIAL DUPLEX Referring Phys: Karoline Caldwell --------------------------------------------------------------------------------  Indications: Peripheral artery disease. High Risk Factors: Hypertension, hyperlipidemia, Diabetes, current smoker,                    coronary artery disease.  Vascular Interventions: RLE fem-pop bypass 10/2013. Current ABI:            RLE 0.72                         LLE 0.50 Comparison Study: Previous arterial duplex was 01/24/14 - unable to access                   results. Performing Technologist: Rogelia Rohrer RVT, RDMS  Examination Guidelines: A complete evaluation includes B-mode imaging, spectral Doppler, color Doppler, and power Doppler as needed of all accessible portions of each vessel. Bilateral testing is considered an integral part of a complete examination. Limited examinations for reoccurring indications may be performed as noted.  +-----------+--------+-----+--------+----------+--------+  RIGHT       PSV cm/s Ratio Stenosis Waveform   Comments  +-----------+--------+-----+--------+----------+--------+  CFA Prox    168                     biphasic             +-----------+--------+-----+--------+----------+--------+  DFA         76                      monophasic           +-----------+--------+-----+--------+----------+--------+  SFA Prox    46                      monophasic            +-----------+--------+-----+--------+----------+--------+  SFA Mid                    occluded                      +-----------+--------+-----+--------+----------+--------+  POP Prox                   occluded                      +-----------+--------+-----+--------+----------+--------+  TP Trunk    118                     monophasic           +-----------+--------+-----+--------+----------+--------+  ATA Prox    37                      monophasic           +-----------+--------+-----+--------+----------+--------+  ATA Distal  29                      monophasic           +-----------+--------+-----+--------+----------+--------+  PTA Prox                   occluded                      +-----------+--------+-----+--------+----------+--------+  PERO Prox   64                      monophasic           +-----------+--------+-----+--------+----------+--------+  PERO Distal 67                      monophasic           +-----------+--------+-----+--------+----------+--------+  Right Graft #1: +------------------+--------+---------------+----------+--------+                     PSV cm/s Stenosis        Waveform   Comments  +------------------+--------+---------------+----------+--------+  Inflow             154                      biphasic             +------------------+--------+---------------+----------+--------+  Prox Anastomosis   262      50-70% stenosis monophasic           +------------------+--------+---------------+----------+--------+  Proximal Graft     55                       monophasic           +------------------+--------+---------------+----------+--------+  Mid Graft          46                       monophasic           +------------------+--------+---------------+----------+--------+  Distal Graft       34                       monophasic           +------------------+--------+---------------+----------+--------+  Distal Anastomosis 71                       monophasic            +------------------+--------+---------------+----------+--------+  Outflow            135  monophasic           +------------------+--------+---------------+----------+--------+   Summary: Right: Chronic occlusion noted in SFA and PTA that was seen on previous exams. Stenosis noted in area of proximal anastomosis of bypass graft.  See table(s) above for measurements and observations. Electronically signed by Harold Barban MD on 03/12/2021 at 9:17:30 PM.    Final       Subjective: Patient seen and examined at the bedside this morning.  Hemodynamically stable for discharge today.  Discharge Exam: Vitals:   03/15/21 0819 03/15/21 1958  BP: (!) 124/54 (!) 126/56  Pulse: (!) 51 64  Resp: 16 18  Temp: 97.8 F (36.6 C) 98.4 F (36.9 C)  SpO2: 97% 100%   Vitals:   03/14/21 1504 03/14/21 2007 03/15/21 0819 03/15/21 1958  BP: 111/60 (!) 107/55 (!) 124/54 (!) 126/56  Pulse: 60 82 (!) 51 64  Resp: 17 16 16 18   Temp: 97.7 F (36.5 C) 97.8 F (36.6 C) 97.8 F (36.6 C) 98.4 F (36.9 C)  TempSrc: Oral Oral Oral Oral  SpO2: 94% 94% 97% 100%  Weight:      Height:        General: Pt is alert, awake, not in acute distress Cardiovascular: RRR, S1/S2 +, no rubs, no gallops Respiratory: CTA bilaterally, no wheezing, no rhonchi Abdominal: Soft, NT, ND, bowel sounds + Extremities: no edema, no cyanosis, right BKA, wound VAC    The results of significant diagnostics from this hospitalization (including imaging, microbiology, ancillary and laboratory) are listed below for reference.     Microbiology: Recent Results (from the past 240 hour(s))  Resp Panel by RT-PCR (Flu A&B, Covid) Nasopharyngeal Swab     Status: None   Collection Time: 03/10/21  2:57 PM   Specimen: Nasopharyngeal Swab; Nasopharyngeal(NP) swabs in vial transport medium  Result Value Ref Range Status   SARS Coronavirus 2 by RT PCR NEGATIVE NEGATIVE Final    Comment: (NOTE) SARS-CoV-2 target nucleic acids  are NOT DETECTED.  The SARS-CoV-2 RNA is generally detectable in upper respiratory specimens during the acute phase of infection. The lowest concentration of SARS-CoV-2 viral copies this assay can detect is 138 copies/mL. A negative result does not preclude SARS-Cov-2 infection and should not be used as the sole basis for treatment or other patient management decisions. A negative result may occur with  improper specimen collection/handling, submission of specimen other than nasopharyngeal swab, presence of viral mutation(s) within the areas targeted by this assay, and inadequate number of viral copies(<138 copies/mL). A negative result must be combined with clinical observations, patient history, and epidemiological information. The expected result is Negative.  Fact Sheet for Patients:  EntrepreneurPulse.com.au  Fact Sheet for Healthcare Providers:  IncredibleEmployment.be  This test is no t yet approved or cleared by the [place] FDA and  has been authorized for detection and/or diagnosis of SARS-CoV-2 by FDA under an Emergency Use Authorization (EUA). This EUA will remain  in effect (meaning this test can be used) for the duration of the COVID-19 declaration under Section 564(b)(1) of the Act, 21 U.S.C.section 360bbb-3(b)(1), unless the authorization is terminated  or revoked sooner.       Influenza A by PCR NEGATIVE NEGATIVE Final   Influenza B by PCR NEGATIVE NEGATIVE Final    Comment: (NOTE) The Xpert Xpress SARS-CoV-2/FLU/RSV plus assay is intended as an aid in the diagnosis of influenza from Nasopharyngeal swab specimens and should not be used as a sole basis for treatment. Nasal washings and aspirates  are unacceptable for Xpert Xpress SARS-CoV-2/FLU/RSV testing.  Fact Sheet for Patients: BloggerCourse.com  Fact Sheet for Healthcare Providers: SeriousBroker.it  This test is  not yet approved or cleared by the [place] FDA and has been authorized for detection and/or diagnosis of SARS-CoV-2 by FDA under an Emergency Use Authorization (EUA). This EUA will remain in effect (meaning this test can be used) for the duration of the COVID-19 declaration under Section 564(b)(1) of the Act, 21 U.S.C. section 360bbb-3(b)(1), unless the authorization is terminated or revoked.  Performed at [facility], [address], [place], [place] [address]   Culture, blood (routine x 2)     Status: None   Collection Time: 03/11/21 12:10 AM   Specimen: Right Antecubital; Blood  Result Value Ref Range Status   Specimen Description RIGHT ANTECUBITAL  Final   Special Requests   Final    BOTTLES DRAWN AEROBIC AND ANAEROBIC Blood Culture adequate volume   Culture   Final    NO GROWTH 5 DAYS Performed at [facility], [address], [place], [place] [address]    Report Status 03/16/2021 FINAL  Final  Culture, blood (routine x 2)     Status: None   Collection Time: 03/11/21 12:10 AM   Specimen: BLOOD RIGHT FOREARM  Result Value Ref Range Status   Specimen Description BLOOD RIGHT FOREARM  Final   Special Requests   Final    BOTTLES DRAWN AEROBIC AND ANAEROBIC Blood Culture results may not be optimal due to an excessive volume of blood received in culture bottles   Culture   Final    NO GROWTH 5 DAYS Performed at [facility], [address], [place], [place] [address]    Report Status 03/16/2021 FINAL  Final  MRSA Next Gen by PCR, Nasal     Status: None   Collection Time: 03/12/21 11:15 AM   Specimen: Nasal Mucosa; Nasal Swab  Result Value Ref Range Status   MRSA by PCR Next Gen NOT DETECTED NOT DETECTED Final    Comment: (NOTE) The GeneXpert MRSA Assay (FDA approved for NASAL specimens only), is one component of a comprehensive MRSA colonization surveillance program. It is not intended to diagnose MRSA infection nor to guide or monitor treatment for MRSA infections. Test  performance is not FDA approved in patients less than 64 years old. Performed at [facility], [address], [place], [place] [address]      Labs: BNP (last 3 results) No results for input(s): BNP in the last 8760 hours. Basic Metabolic Panel: Recent Labs  Lab 03/12/21 0852 03/13/21 0311 03/14/21 0124 03/15/21 0222 03/16/21 0122  NA 128* 130* 131* 130* 131*  K 5.2* 5.2* 4.5 4.5 4.8  CL 94* 99 99 99 101  CO2 25 23 23  19* 21*  GLUCOSE 208* 177* 171* 267* 162*  BUN 21* 21* 25* 26* 24*  CREATININE 1.26* 1.24* 1.21* 1.26* 1.05*  CALCIUM 9.1 8.7* 8.8* 8.8* 8.3*   Liver Function Tests: Recent Labs  Lab 03/11/21 0010  AST 61*  ALT 53*  ALKPHOS 127*  BILITOT 0.3  PROT 7.8  ALBUMIN 2.2*   No results for input(s): LIPASE, AMYLASE in the last 168 hours. No results for input(s): AMMONIA in the last 168 hours. CBC: Recent Labs  Lab 03/10/21 1242 03/11/21 0010 03/12/21 0852 03/13/21 0311 03/14/21 0124 03/15/21 0222  WBC 11.1* 8.4 7.5 8.3 7.8 12.9*  NEUTROABS 8.5*  --  4.9 5.5 4.6 10.2*  HGB 9.0* 8.9* 8.8* 8.8* 8.3*  8.1*  HCT 30.4* 28.5* 27.7* 28.2* 27.3* 26.9*  MCV 93.3 92.8 89.9 90.4 91.0 92.1  PLT 516* 514* 473* 443* 418* 481*   Cardiac Enzymes: No results for input(s): CKTOTAL, CKMB, CKMBINDEX, TROPONINI in the last 168 hours. BNP: Invalid input(s): POCBNP CBG: Recent Labs  Lab 03/15/21 0818 03/15/21 1135 03/15/21 1601 03/15/21 2112 03/16/21 0908  GLUCAP 199* 203* 165* 196* 133*   D-Dimer No results for input(s): DDIMER in the last 72 hours. Hgb A1c No results for input(s): HGBA1C in the last 72 hours. Lipid Profile No results for input(s): CHOL, HDL, LDLCALC, TRIG, CHOLHDL, LDLDIRECT in the last 72 hours. Thyroid function studies No results for input(s): TSH, T4TOTAL, T3FREE, THYROIDAB in the last 72 hours.  Invalid input(s): FREET3 Anemia work up No results for input(s): VITAMINB12, FOLATE, FERRITIN, TIBC, IRON, RETICCTPCT in the last 72  hours. Urinalysis    Component Value Date/Time   COLORURINE AMBER (A) 03/10/2021 1506   APPEARANCEUR CLOUDY (A) 03/10/2021 1506   LABSPEC 1.025 03/10/2021 1506   PHURINE 5.0 03/10/2021 1506   GLUCOSEU 50 (A) 03/10/2021 1506   HGBUR NEGATIVE 03/10/2021 1506   BILIRUBINUR NEGATIVE 03/10/2021 1506   KETONESUR 5 (A) 03/10/2021 1506   PROTEINUR >=300 (A) 03/10/2021 1506   UROBILINOGEN 0.2 03/10/2014 1132   NITRITE NEGATIVE 03/10/2021 1506   LEUKOCYTESUR NEGATIVE 03/10/2021 1506   Sepsis Labs Invalid input(s): PROCALCITONIN,  WBC,  LACTICIDVEN Microbiology Recent Results (from the past 240 hour(s))  Resp Panel by RT-PCR (Flu A&B, Covid) Nasopharyngeal Swab     Status: None   Collection Time: 03/10/21  2:57 PM   Specimen: Nasopharyngeal Swab; Nasopharyngeal(NP) swabs in vial transport medium  Result Value Ref Range Status   SARS Coronavirus 2 by RT PCR NEGATIVE NEGATIVE Final    Comment: (NOTE) SARS-CoV-2 target nucleic acids are NOT DETECTED.  The SARS-CoV-2 RNA is generally detectable in upper respiratory specimens during the acute phase of infection. The lowest concentration of SARS-CoV-2 viral copies this assay can detect is 138 copies/mL. A negative result does not preclude SARS-Cov-2 infection and should not be used as the sole basis for treatment or other patient management decisions. A negative result may occur with  improper specimen collection/handling, submission of specimen other than nasopharyngeal swab, presence of viral mutation(s) within the areas targeted by this assay, and inadequate number of viral copies(<138 copies/mL). A negative result must be combined with clinical observations, patient history, and epidemiological information. The expected result is Negative.  Fact Sheet for Patients:  EntrepreneurPulse.com.au  Fact Sheet for Healthcare Providers:  IncredibleEmployment.be  This test is no t yet approved or cleared by  the [place] FDA and  has been authorized for detection and/or diagnosis of SARS-CoV-2 by FDA under an Emergency Use Authorization (EUA). This EUA will remain  in effect (meaning this test can be used) for the duration of the COVID-19 declaration under Section 564(b)(1) of the Act, 21 U.S.C.section 360bbb-3(b)(1), unless the authorization is terminated  or revoked sooner.       Influenza A by PCR NEGATIVE NEGATIVE Final   Influenza B by PCR NEGATIVE NEGATIVE Final    Comment: (NOTE) The Xpert Xpress SARS-CoV-2/FLU/RSV plus assay is intended as an aid in the diagnosis of influenza from Nasopharyngeal swab specimens and should not be used as a sole basis for treatment. Nasal washings and aspirates are unacceptable for Xpert Xpress SARS-CoV-2/FLU/RSV testing.  Fact Sheet for Patients: EntrepreneurPulse.com.au  Fact Sheet for Healthcare Providers: IncredibleEmployment.be  This test is not  yet approved or cleared by the [place] and has been authorized for detection and/or diagnosis of SARS-CoV-2 by FDA under an Emergency Use Authorization (EUA). This EUA will remain in effect (meaning this test can be used) for the duration of the COVID-19 declaration under Section 564(b)(1) of the Act, 21 U.S.C. section 360bbb-3(b)(1), unless the authorization is terminated or revoked.  Performed at [facility], [address] [place], [place] [address]   Culture, blood (routine x 2)     Status: None   Collection Time: 03/11/21 12:10 AM   Specimen: Right Antecubital; Blood  Result Value Ref Range Status   Specimen Description RIGHT ANTECUBITAL  Final   Special Requests   Final    BOTTLES DRAWN AEROBIC AND ANAEROBIC Blood Culture adequate volume   Culture   Final    NO GROWTH 5 DAYS Performed at [facility], [address] [place], [place] [address]    Report Status 03/16/2021 FINAL  Final  Culture, blood (routine x 2)     Status: None    Collection Time: 03/11/21 12:10 AM   Specimen: BLOOD RIGHT FOREARM  Result Value Ref Range Status   Specimen Description BLOOD RIGHT FOREARM  Final   Special Requests   Final    BOTTLES DRAWN AEROBIC AND ANAEROBIC Blood Culture results may not be optimal due to an excessive volume of blood received in culture bottles   Culture   Final    NO GROWTH 5 DAYS Performed at [facility], [address] [place], [place] [address]    Report Status 03/16/2021 FINAL  Final  MRSA Next Gen by PCR, Nasal     Status: None   Collection Time: 03/12/21 11:15 AM   Specimen: Nasal Mucosa; Nasal Swab  Result Value Ref Range Status   MRSA by PCR Next Gen NOT DETECTED NOT DETECTED Final    Comment: (NOTE) The GeneXpert MRSA Assay (FDA approved for NASAL specimens only), is one component of a comprehensive MRSA colonization surveillance program. It is not intended to diagnose MRSA infection nor to guide or monitor treatment for MRSA infections. Test performance is not FDA approved in patients less than 83 years old. Performed at [facility], [place] [address], [place],  [address]     Please note: You were cared for by a hospitalist during your hospital stay. Once you are discharged, your primary care physician will handle any further medical issues. Please note that NO REFILLS for any discharge medications will be authorized once you are discharged, as it is imperative that you return to your primary care physician (or establish a relationship with a primary care physician if you do not have one) for your post hospital discharge needs so that they can reassess your need for medications and monitor your lab values.    Time coordinating discharge: 40 minutes  SIGNED:   Shelly Coss, MD  [facility] 03/16/2021, 11:25 AM Pager [phone]  If 7PM-7AM, please contact night-coverage [email or web address] Password [email or web address]

## 2021-03-16 NOTE — Progress Notes (Addendum)
Patient was seen by PT and OT, both cleared patient for discharge.  Discharge order in place.  Discharge instructions given to patient.  Education emphasized on surgical site care, reiterated PT/OT education and to follow instructions.  Emphasized education as well on signs of infection to watch for and importance of completing antibiotic treatment.  Encouraged patient to call the doctor for any concerns.  Patient verbalized understanding.  Wound vac switched to portable wound vac.  Patient expressed knowledge on how to handle portable wound vac, she stated that she had the wound vac at home in the past.  Discharged home.

## 2021-03-16 NOTE — TOC Transition Note (Signed)
Transition of Care [facility]) - CM/SW Discharge Note   Patient Details  Name: Christy [name] MRN: [id number] Date of Birth: [birth year]  Transition of Care [facility]) CM/SW Contact:  Bartholomew Crews, RN Phone Number: [phone] 03/16/2021, 11:43 AM   Clinical Narrative:     Patient to transition home today. HH orders provided. Katina with [facility] notified. No further TOC needs identified.   Final next level of care: [place] Barriers to Discharge: No Barriers Identified   Patient Goals and CMS Choice Patient states their goals for this hospitalization and ongoing recovery are:: return home with family support CMS Medicare.gov Compare Post Acute Care list provided to:: Patient Choice offered to / list presented to : Patient  Discharge Placement                       Discharge Plan and Services In-house Referral: NA Discharge Planning Services: CM Consult Post Acute Care Choice: Home Health          DME Arranged: N/A DME Agency: NA       HH Arranged: PT, OT HH Agency: [place] Date [place]: 03/16/21 Time [place]: 1143 Representative spoke with at [place]: [place] ([place]) Interventions     Readmission Risk Interventions No flowsheet data found.

## 2021-03-16 NOTE — Progress Notes (Signed)
Responded to call, patient called for pain medicine.  Patient refused to take pain medicine now, stating "I have waited and called, I am very unhappy".  MD rounding and aware.  Charge RN made aware.

## 2021-03-17 ENCOUNTER — Ambulatory Visit (HOSPITAL_COMMUNITY): Payer: Self-pay | Admitting: Physical Therapy

## 2021-03-17 ENCOUNTER — Telehealth (HOSPITAL_COMMUNITY): Payer: Self-pay | Admitting: Physical Therapy

## 2021-03-17 LAB — SURGICAL PATHOLOGY

## 2021-03-17 NOTE — Telephone Encounter (Signed)
Please d/c patient - she is in the hospital and they removed her lower extremity

## 2021-03-19 ENCOUNTER — Ambulatory Visit (HOSPITAL_COMMUNITY): Payer: Self-pay | Admitting: Physical Therapy

## 2021-03-25 ENCOUNTER — Ambulatory Visit (INDEPENDENT_AMBULATORY_CARE_PROVIDER_SITE_OTHER): Payer: 59 | Admitting: Physician Assistant

## 2021-03-25 ENCOUNTER — Encounter: Payer: Self-pay | Admitting: Physician Assistant

## 2021-03-25 ENCOUNTER — Other Ambulatory Visit: Payer: Self-pay

## 2021-03-25 VITALS — BP 109/57 | HR 56 | Temp 97.0°F | Resp 18 | Ht 65.0 in | Wt 213.0 lb

## 2021-03-25 DIAGNOSIS — I739 Peripheral vascular disease, unspecified: Secondary | ICD-10-CM

## 2021-03-25 NOTE — Progress Notes (Signed)
°  POST OPERATIVE OFFICE NOTE    CC:  F/u for surgery  HPI:  This is a 61 y.o. female who is s/p right BKA  on 03/14/21 by Dr. Lenell Antu right foot ischemic changes that was not salvagable.    Pt returns today for follow up.  Pt states No complaints over all.  Her pain is well controlled.  She is becoming more mobile and stronger.    Allergies  Allergen Reactions   Alpha-Gal Anaphylaxis    RED MEAT ALLERGY   Bactrim Diarrhea   Isosorbide Mononitrate Other (See Comments)    Made patient feel as though she was about to have another MI   Keflex [Cephalexin] Other (See Comments)    Yeast infection    Current Outpatient Medications  Medication Sig Dispense Refill   aspirin EC 81 MG tablet Take 81 mg by mouth daily.     atorvastatin (LIPITOR) 20 MG tablet Take 20 mg by mouth every evening.     citalopram (CELEXA) 20 MG tablet Take 20 mg by mouth daily.     fish oil-omega-3 fatty acids 1000 MG capsule Take 3 g by mouth daily.      glipiZIDE (GLUCOTROL) 5 MG tablet Take 5 mg by mouth 2 (two) times daily before a meal.     lisinopril (ZESTRIL) 10 MG tablet Take 10 mg by mouth daily.     metFORMIN (GLUCOPHAGE) 500 MG tablet Take 500 mg by mouth 2 (two) times daily with a meal.     metoprolol tartrate (LOPRESSOR) 25 MG tablet Take 1 tablet (25 mg total) by mouth 2 (two) times daily. 180 tablet 3   niacin 500 MG CR capsule Take 1 capsule (500 mg total) by mouth at bedtime. 30 capsule 6   nitroGLYCERIN (NITROSTAT) 0.4 MG SL tablet Place 1 tablet (0.4 mg total) under the tongue every 5 (five) minutes as needed. 25 tablet 5   oxyCODONE (ROXICODONE) 5 MG immediate release tablet Take 1 tablet (5 mg total) by mouth every 6 (six) hours as needed for severe pain. 15 tablet 0   Vitamin D, Cholecalciferol, 25 MCG (1000 UT) TABS Take 1,000 Units by mouth daily. 30 tablet 0   No current facility-administered medications for this visit.     ROS:  See HPI  Physical Exam:     Incision:  healing well  without erythema or drainage.  Staples intact Incisional wound vac removed. Extremities:  stump appears viable   Assessment/Plan:  This is a 61 y.o. female who is s/p: Right BKA Healing well.  Stump appears viable.  Will schedule her f/u in 3 weeks for exam and staple removal. D/C limb guard and vac.  She will use dry dressing as needed and stump sock for compression.  She may shower PRN.      Mosetta Pigeon PA-C [facility] [phone]   Clinic MD:  Lenell Antu

## 2021-04-02 DIAGNOSIS — R300 Dysuria: Secondary | ICD-10-CM | POA: Diagnosis not present

## 2021-04-16 ENCOUNTER — Encounter: Payer: Self-pay | Admitting: Physician Assistant

## 2021-04-16 ENCOUNTER — Ambulatory Visit (INDEPENDENT_AMBULATORY_CARE_PROVIDER_SITE_OTHER): Payer: 59 | Admitting: Physician Assistant

## 2021-04-16 ENCOUNTER — Other Ambulatory Visit: Payer: Self-pay

## 2021-04-16 VITALS — BP 131/72 | HR 55 | Temp 98.1°F | Resp 20 | Ht 65.0 in

## 2021-04-16 DIAGNOSIS — Z89511 Acquired absence of right leg below knee: Secondary | ICD-10-CM

## 2021-04-16 NOTE — Progress Notes (Signed)
?  Progress Note ? ? ? ? ?Subjective:  no complaints ? ?HPI:  61 y/o female with hx of Type II DM, HTN, CKD III, tobacco abuse, charcot foot, and peripheral artery disease with history of RLE bypass and 4th and 5th toe amputations. She is familiar to [facility] as a patient of Dr. Scot Dock.  ? ?She underwent right BKA on 03/14/2021 by Dr. Stanford Breed for right foot ischemic changes that was not salvagable.  she was seen on 03/25/2021 and dressing was removed and incision looked good.  She was scheduled for follow up for staple removal and is here today for that visit.  ? ?Pt doing well.  Says she has occasional ghost pains.  She is ready for the staples to come out.  She denies any pain or issues with the left leg. ? ? ? ?Physical Exam: ?Incisions:  healing nicely ? ? ?Left leg with palpable DP pulse and brisk doppler signals left DP/PT ? ?CBC ?   ?Component Value Date/Time  ? WBC 12.9 (H) 03/15/2021 0222  ? RBC 2.92 (L) 03/15/2021 0222  ? HGB 8.1 (L) 03/15/2021 0222  ? HCT 26.9 (L) 03/15/2021 0222  ? PLT 481 (H) 03/15/2021 0222  ? MCV 92.1 03/15/2021 0222  ? MCH 27.7 03/15/2021 0222  ? MCHC 30.1 03/15/2021 0222  ? RDW 15.9 (H) 03/15/2021 0222  ? LYMPHSABS 1.5 03/15/2021 0222  ? MONOABS 0.7 03/15/2021 0222  ? EOSABS 0.0 03/15/2021 0222  ? BASOSABS 0.1 03/15/2021 0222  ? ? ?BMET ?   ?Component Value Date/Time  ? NA 131 (L) 03/16/2021 0122  ? K 4.8 03/16/2021 0122  ? CL 101 03/16/2021 0122  ? CO2 21 (L) 03/16/2021 0122  ? GLUCOSE 162 (H) 03/16/2021 0122  ? BUN 24 (H) 03/16/2021 0122  ? CREATININE 1.05 (H) 03/16/2021 0122  ? CREATININE 1.16 (H) 07/14/2011 [id number]  ? CALCIUM 8.3 (L) 03/16/2021 0122  ? GFRNONAA >60 03/16/2021 0122  ? GFRAA 59 (L) 10/20/2013 0312  ? ? ? ? ? ?Assessment/Plan:  61 y.o. female is s/p right below knee amputation by Dr. Stanford Breed on 03/14/2021 for ischemic changes to the foot that were not salvageable.  ? ?-pt right BKA healed nicely.  Staples removed today.  Rx for [facility] given. ?-will have her come back in one year  with ABI for left leg.  If ABI ok and she is not having any issues at that time, she will be able to f/u as needed. ?-discussed importance of smoking cessation.  ? ? ?Leontine Locket, PA-C ?[facility] ?[phone] ? ? ?The patient has a right Below Knee Amputation. The patient is well motivated to return to their prior functional status by utilizing a prosthesis to perform ADL's and maintain a healthy lifestyle. The patient has the physical and cognitive capacity to function with a prosthesis.  ? ?Functional Level: ?K2 Limited Community Ambulator: Has the ability or potential for ambulation and to traverse low environmental barriers such as curbs, stairs or uneven surfaces ? ?Residual Limb History: ?The skin condition of the residual limb is healthy. The patient will continue to monitor the skin of the residual limb and follow hygiene instructions.  ?The patient is experiencing occasional phantom pain. ? ?Prosthetic Prescription Plan: ?Counseling and education regarding prosthetic management will be provided to the patient via a certified prosthetist. ?A multi-discipline team, including physical therapy, will manage the prosthetic fabrication, fitting and prosthetic gait training.  ? ? ? ? ? ? ?  ? ?

## 2021-05-08 DIAGNOSIS — N1831 Chronic kidney disease, stage 3a: Secondary | ICD-10-CM | POA: Diagnosis not present

## 2021-05-08 DIAGNOSIS — E7849 Other hyperlipidemia: Secondary | ICD-10-CM | POA: Diagnosis not present

## 2021-05-08 DIAGNOSIS — E1122 Type 2 diabetes mellitus with diabetic chronic kidney disease: Secondary | ICD-10-CM | POA: Diagnosis not present

## 2021-05-08 DIAGNOSIS — N189 Chronic kidney disease, unspecified: Secondary | ICD-10-CM | POA: Diagnosis not present

## 2021-05-08 DIAGNOSIS — E1165 Type 2 diabetes mellitus with hyperglycemia: Secondary | ICD-10-CM | POA: Diagnosis not present

## 2021-05-08 DIAGNOSIS — E782 Mixed hyperlipidemia: Secondary | ICD-10-CM | POA: Diagnosis not present

## 2021-05-08 DIAGNOSIS — E781 Pure hyperglyceridemia: Secondary | ICD-10-CM | POA: Diagnosis not present

## 2021-05-13 DIAGNOSIS — E875 Hyperkalemia: Secondary | ICD-10-CM | POA: Diagnosis not present

## 2021-05-13 DIAGNOSIS — Z23 Encounter for immunization: Secondary | ICD-10-CM | POA: Diagnosis not present

## 2021-05-13 DIAGNOSIS — R69 Illness, unspecified: Secondary | ICD-10-CM | POA: Diagnosis not present

## 2021-05-13 DIAGNOSIS — E782 Mixed hyperlipidemia: Secondary | ICD-10-CM | POA: Diagnosis not present

## 2021-05-13 DIAGNOSIS — S88111A Complete traumatic amputation at level between knee and ankle, right lower leg, initial encounter: Secondary | ICD-10-CM | POA: Diagnosis not present

## 2021-05-13 DIAGNOSIS — I10 Essential (primary) hypertension: Secondary | ICD-10-CM | POA: Diagnosis not present

## 2021-05-13 DIAGNOSIS — N1831 Chronic kidney disease, stage 3a: Secondary | ICD-10-CM | POA: Diagnosis not present

## 2021-05-13 DIAGNOSIS — E1122 Type 2 diabetes mellitus with diabetic chronic kidney disease: Secondary | ICD-10-CM | POA: Diagnosis not present

## 2021-05-13 DIAGNOSIS — Z0000 Encounter for general adult medical examination without abnormal findings: Secondary | ICD-10-CM | POA: Diagnosis not present

## 2021-05-13 DIAGNOSIS — Z7689 Persons encountering health services in other specified circumstances: Secondary | ICD-10-CM | POA: Diagnosis not present

## 2021-06-10 DIAGNOSIS — Z89511 Acquired absence of right leg below knee: Secondary | ICD-10-CM | POA: Diagnosis not present

## 2021-07-02 DIAGNOSIS — Z6836 Body mass index (BMI) 36.0-36.9, adult: Secondary | ICD-10-CM | POA: Diagnosis not present

## 2021-07-02 DIAGNOSIS — I129 Hypertensive chronic kidney disease with stage 1 through stage 4 chronic kidney disease, or unspecified chronic kidney disease: Secondary | ICD-10-CM | POA: Diagnosis not present

## 2021-07-02 DIAGNOSIS — Z716 Tobacco abuse counseling: Secondary | ICD-10-CM | POA: Diagnosis not present

## 2021-07-02 DIAGNOSIS — D638 Anemia in other chronic diseases classified elsewhere: Secondary | ICD-10-CM | POA: Diagnosis not present

## 2021-07-02 DIAGNOSIS — R809 Proteinuria, unspecified: Secondary | ICD-10-CM | POA: Diagnosis not present

## 2021-07-02 DIAGNOSIS — N189 Chronic kidney disease, unspecified: Secondary | ICD-10-CM | POA: Diagnosis not present

## 2021-07-02 DIAGNOSIS — K760 Fatty (change of) liver, not elsewhere classified: Secondary | ICD-10-CM | POA: Diagnosis not present

## 2021-07-02 DIAGNOSIS — I739 Peripheral vascular disease, unspecified: Secondary | ICD-10-CM | POA: Diagnosis not present

## 2021-07-02 DIAGNOSIS — I7090 Unspecified atherosclerosis: Secondary | ICD-10-CM | POA: Diagnosis not present

## 2021-07-02 DIAGNOSIS — E871 Hypo-osmolality and hyponatremia: Secondary | ICD-10-CM | POA: Diagnosis not present

## 2021-07-02 DIAGNOSIS — N393 Stress incontinence (female) (male): Secondary | ICD-10-CM | POA: Diagnosis not present

## 2021-07-02 DIAGNOSIS — E8721 Acute metabolic acidosis: Secondary | ICD-10-CM | POA: Diagnosis not present

## 2021-07-03 ENCOUNTER — Other Ambulatory Visit (HOSPITAL_COMMUNITY): Payer: Self-pay | Admitting: Nephrology

## 2021-07-03 DIAGNOSIS — I7090 Unspecified atherosclerosis: Secondary | ICD-10-CM

## 2021-07-03 DIAGNOSIS — I129 Hypertensive chronic kidney disease with stage 1 through stage 4 chronic kidney disease, or unspecified chronic kidney disease: Secondary | ICD-10-CM

## 2021-07-03 DIAGNOSIS — E8721 Acute metabolic acidosis: Secondary | ICD-10-CM

## 2021-07-03 DIAGNOSIS — I739 Peripheral vascular disease, unspecified: Secondary | ICD-10-CM

## 2021-07-03 DIAGNOSIS — E1122 Type 2 diabetes mellitus with diabetic chronic kidney disease: Secondary | ICD-10-CM

## 2021-07-03 DIAGNOSIS — D638 Anemia in other chronic diseases classified elsewhere: Secondary | ICD-10-CM

## 2021-07-03 DIAGNOSIS — E871 Hypo-osmolality and hyponatremia: Secondary | ICD-10-CM

## 2021-07-03 DIAGNOSIS — E1129 Type 2 diabetes mellitus with other diabetic kidney complication: Secondary | ICD-10-CM

## 2021-07-10 ENCOUNTER — Ambulatory Visit (HOSPITAL_COMMUNITY)
Admission: RE | Admit: 2021-07-10 | Discharge: 2021-07-10 | Disposition: A | Discharge status: Home / Self Care | Payer: 59 | Source: Ambulatory Visit | Attending: Nephrology | Admitting: Nephrology

## 2021-07-10 DIAGNOSIS — D638 Anemia in other chronic diseases classified elsewhere: Secondary | ICD-10-CM | POA: Insufficient documentation

## 2021-07-10 DIAGNOSIS — I739 Peripheral vascular disease, unspecified: Secondary | ICD-10-CM | POA: Insufficient documentation

## 2021-07-10 DIAGNOSIS — E1129 Type 2 diabetes mellitus with other diabetic kidney complication: Secondary | ICD-10-CM | POA: Diagnosis not present

## 2021-07-10 DIAGNOSIS — I7090 Unspecified atherosclerosis: Secondary | ICD-10-CM | POA: Insufficient documentation

## 2021-07-10 DIAGNOSIS — E1122 Type 2 diabetes mellitus with diabetic chronic kidney disease: Secondary | ICD-10-CM | POA: Insufficient documentation

## 2021-07-10 DIAGNOSIS — N181 Chronic kidney disease, stage 1: Secondary | ICD-10-CM | POA: Insufficient documentation

## 2021-07-10 DIAGNOSIS — E8721 Acute metabolic acidosis: Secondary | ICD-10-CM | POA: Diagnosis not present

## 2021-07-10 DIAGNOSIS — R809 Proteinuria, unspecified: Secondary | ICD-10-CM | POA: Diagnosis not present

## 2021-07-10 DIAGNOSIS — I129 Hypertensive chronic kidney disease with stage 1 through stage 4 chronic kidney disease, or unspecified chronic kidney disease: Secondary | ICD-10-CM | POA: Diagnosis not present

## 2021-07-10 DIAGNOSIS — N189 Chronic kidney disease, unspecified: Secondary | ICD-10-CM | POA: Diagnosis not present

## 2021-07-10 DIAGNOSIS — E871 Hypo-osmolality and hyponatremia: Secondary | ICD-10-CM | POA: Diagnosis not present

## 2021-07-16 ENCOUNTER — Ambulatory Visit (HOSPITAL_COMMUNITY): Payer: 59 | Attending: Physician Assistant | Admitting: Physical Therapy

## 2021-07-16 ENCOUNTER — Encounter (HOSPITAL_COMMUNITY): Payer: Self-pay | Admitting: Physical Therapy

## 2021-07-16 DIAGNOSIS — R29898 Other symptoms and signs involving the musculoskeletal system: Secondary | ICD-10-CM | POA: Insufficient documentation

## 2021-07-16 DIAGNOSIS — M6281 Muscle weakness (generalized): Secondary | ICD-10-CM | POA: Diagnosis not present

## 2021-07-16 DIAGNOSIS — R2689 Other abnormalities of gait and mobility: Secondary | ICD-10-CM | POA: Insufficient documentation

## 2021-07-16 DIAGNOSIS — Z89511 Acquired absence of right leg below knee: Secondary | ICD-10-CM | POA: Insufficient documentation

## 2021-07-16 NOTE — Therapy (Signed)
OUTPATIENT PHYSICAL THERAPY LOWER EXTREMITY EVALUATION   Patient Name: Christy [name] MRN: [id number] DOB:22-Nov-1960, 61 y.o., female Today's Date: 07/16/2021   PT End of Session - 07/16/21 1433     Visit Number 1    Number of Visits 12    Date for PT Re-Evaluation 08/27/21    Authorization Type [employer] (no auth, 35 visits combined PT/OT/LT, 3 used/ 32 remaining)    Authorization - Visit Number 1    Authorization - Number of Visits 32    Progress Note Due on Visit 10    PT Start Time 1435    PT Stop Time 1516    PT Time Calculation (min) 41 min    Activity Tolerance Patient tolerated treatment well    Behavior During Therapy WFL for tasks assessed/performed             Past Medical History:  Diagnosis Date   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   Arthritis    Chronic kidney disease    recent UTI   Coronary artery disease    Depression 06-13-10   Following sudden death of husband   Diabetes mellitus, type 2 (HCC)    Hypertension    Kidney cysts    Metabolic syndrome    Elevated triglycerides, low HDL, fasting hyperglycemia with low-dose sulfonylurea   Myocardial infarction (HCC) 09/2010   PONV (postoperative nausea and vomiting)    Tobacco abuse    60-90-pack-year history; 1/3 pack per day in 2011-06-13   Past Surgical History:  Procedure Laterality Date   ABDOMINAL AORTAGRAM N/A 10/09/2013   Procedure: ABDOMINAL Ronny Flurry;  Surgeon: Chuck Hint, MD;  Location: [facility] CATH LAB;  Service: Cardiovascular;  Laterality: N/A;   AMPUTATION Right 10/19/2013   Procedure: AMPUTATION DIGIT-RIGHT 4TH TOE AMPUTATION;  Surgeon: Chuck Hint, MD;  Location: [facility] OR;  Service: Vascular;  Laterality: Right;   AMPUTATION Right 03/14/2021   Procedure: RIGHT BELOW KNEE AMPUTATION;  Surgeon: Leonie Douglas, MD;  Location: [facility] OR;  Service: Vascular;  Laterality: Right;   APPLICATION OF WOUND VAC Right 11/24/2012   Procedure:  APPLICATION OF WOUND VAC;  Surgeon: Dallas Schimke, DPM;  Location: [facility] ORS;  Service: Orthopedics;  Laterality: Right;   BILATERAL OOPHORECTOMY  06-12-2009   for benign mass   BREAST SURGERY Left    brest biopsy   CHOLECYSTECTOMY     CORONARY ANGIOPLASTY WITH STENT PLACEMENT  10/01/10   DES x2   FEMORAL-POPLITEAL BYPASS GRAFT Right 10/19/2013   Procedure: RIGHT FEMORAL-BELOW KNEE POPLITEAL ARTERY BYPASS GRAFT;  Surgeon: Chuck Hint, MD;  Location: [facility] OR;  Service: Vascular;  Laterality: Right;   INCISION AND DRAINAGE OF WOUND Right 11/24/2012   Procedure: DEBRIDEMENT WOUND FOOT;  Surgeon: Dallas Schimke, DPM;  Location: [facility] ORS;  Service: Orthopedics;  Laterality: Right;   INCISION AND DRAINAGE OF WOUND Right 03/23/2013   Procedure: DEBRIDEMENT OF INFECTED BONE 5TH METATARSAL RIGHT FOOT;  Surgeon: Dallas Schimke, DPM;  Location: [facility] ORS;  Service: Orthopedics;  Laterality: Right;   INTRAOPERATIVE ARTERIOGRAM Right 10/19/2013   Procedure: INTRA OPERATIVE ARTERIOGRAM;  Surgeon: Chuck Hint, MD;  Location: [facility] OR;  Service: Vascular;  Laterality: Right;   METATARSAL HEAD EXCISION Right 11/03/2012   Procedure: AMPUTATION OF RIGHT FIFTH TOE AND PORTION OF FIFTH METATARSAL;  Surgeon: Dallas Schimke, DPM;  Location: [facility] ORS;  Service: Orthopedics;  Laterality: Right;   MINOR APPLICATION  OF WOUND VAC Right 03/14/2021   Procedure: MINOR APPLICATION OF WOUND VAC;  Surgeon: Leonie DouglasHawken, Thomas N, MD;  Location: [facility] OR;  Service: Vascular;  Laterality: Right;   TOE AMPUTATION     Patient Active Problem List   Diagnosis Date Noted   Osteomyelitis of right foot (HCC) 03/10/2021   Diabetic foot ulcer (HCC) 03/10/2021   Atherosclerosis of artery of extremity with ulceration (HCC) 01/24/2014   Ischemic ulcer of right foot (HCC) 11/22/2013   PAD (peripheral artery disease) (HCC) 10/19/2013   Atherosclerotic PVD with ulceration (HCC) 10/05/2013   Metabolic syndrome    Morbid  obesity with BMI of 40.0-44.9, adult (HCC) 12/17/2011   Hyperlipidemia 07/03/2011   Diabetes mellitus, type 2 (HCC)    Reactive depression (situational) 10/17/2010   Hypertension 10/17/2010   Tobacco abuse 10/17/2010   Arteriosclerotic cardiovascular disease (ASCVD) 09/17/2010    PCP: Dion SaucierBoles, Alyssa H, PA-C   REFERRING PROVIDER: Dion SaucierBoles, Alyssa H, PA-C   REFERRING DIAG: amputation of RLE below knee   THERAPY DIAG:  S/P BKA (below knee amputation), right (HCC)  Muscle weakness (generalized)  Other abnormalities of gait and mobility  Other symptoms and signs involving the musculoskeletal system  Rationale for Evaluation and Treatment Rehabilitation  ONSET DATE: amputation 03/14/21  SUBJECTIVE:   SUBJECTIVE STATEMENT: She's been getting around alright. She would like to be able to get off the walker.She just got the leg at the beginning of April.   PERTINENT HISTORY: hx of Type II DM, HTN, CKD III, tobacco abuse, charcot foot, and peripheral artery disease with history of RLE bypass and 4th and 5th toe amputations   PAIN:  Are you having pain? No  PRECAUTIONS: Fall  WEIGHT BEARING RESTRICTIONS No  FALLS:  Has patient fallen in last 6 months? Yes. Number of falls 6  LIVING ENVIRONMENT: Lives with: lives with their family Lives in: Mobile home Stairs:  ramp Has following equipment at home: Single point cane, Environmental consultantWalker - 2 wheeled, Wheelchair (manual), shower chair, bed side commode, and Ramped entry  OCCUPATION: not employed  PLOF: Independent  PATIENT GOALS walk without walker   OBJECTIVE:    COGNITION:  Overall cognitive status: Within functional limits for tasks assessed     SENSATION: WFL     POSTURE: No Significant postural limitations  PALPATION: Healed incision with lateral edge rolled  LOWER EXTREMITY ROM:  Decreased hip extension bilaterally in standing  Active ROM Right eval Left eval  Hip flexion    Hip extension    Hip abduction     Hip adduction    Hip internal rotation    Hip external rotation    Knee flexion    Knee extension    Ankle dorsiflexion    Ankle plantarflexion    Ankle inversion    Ankle eversion     (Blank rows = not tested)  LOWER EXTREMITY MMT:  MMT Right eval Left eval  Hip flexion    Hip extension    Hip abduction    Hip adduction    Hip internal rotation    Hip external rotation    Knee flexion 5/5   Knee extension 5/5   Ankle dorsiflexion    Ankle plantarflexion    Ankle inversion    Ankle eversion     (Blank rows = not tested)   FUNCTIONAL TESTS:  Stairs: able to complete with alternating pattern with heavy UE support SLS: <2 seconds bilateral Transfers: labored with bilateral UE support  GAIT: Distance walked: 200  Assistive device utilized: Environmental consultant - 2 wheeled and None Level of assistance: CGA Comments: able to ambulate with and without RW, wide BOS limited hip and knee flexion bilaterally, L hip pain increasing throughout, decreased hip extension and trunk flexed    TODAY'S TREATMENT: 07/16/21 Standing hip abduction 2x 10 bilateral    PATIENT EDUCATION:  Education details: Patient educated on exam findings, POC, scope of PT, HEP, and skin checks. Person educated: Patient Education method: Explanation, Demonstration, and Handouts Education comprehension: verbalized understanding, returned demonstration, verbal cues required, and tactile cues required  HOME EXERCISE PROGRAM: 5/31/23Access Code: [email or web address] - Standing Hip Abduction with Counter Support  - 3 x daily - 7 x weekly - 2 sets - 10 reps  ASSESSMENT:  CLINICAL IMPRESSION: Patient a 61 y.o. y.o. female who was seen today for physical therapy evaluation and treatment for s/p R BKA on 03/14/21. Patient presents with  limited deficits in LE  strength, ROM, endurance, activity tolerance, gait,balance,  and functional mobility with ADL. Patient is having to modify and restrict ADL as indicated by outcome measure  score as well as subjective information and objective measures which is affecting overall participation. Patient will benefit from skilled physical therapy in order to improve function and reduce impairment.  OBJECTIVE IMPAIRMENTS Abnormal gait, decreased activity tolerance, decreased balance, decreased knowledge of use of DME, decreased mobility, difficulty walking, decreased ROM, decreased strength, increased edema, impaired flexibility, and improper body mechanics.   ACTIVITY LIMITATIONS carrying, lifting, bending, standing, squatting, stairs, transfers, locomotion level, and caring for others  PARTICIPATION LIMITATIONS: meal prep, cleaning, laundry, driving, community activity, and yard work  PERSONAL FACTORS Fitness, Past/current experiences, Time since onset of injury/illness/exacerbation, and 3+ comorbidities: CAD, metabolic syndrome, Hx MI, hx toe ambutations, RLE bypass graft, CKD  are also affecting patient's functional outcome.   REHAB POTENTIAL: Good  CLINICAL DECISION MAKING: Stable/uncomplicated  EVALUATION COMPLEXITY: Low   GOALS: Goals reviewed with patient? Yes  SHORT TERM GOALS: Target date: 08/06/2021  Patient will be independent with HEP in order to improve functional outcomes. Baseline:  Goal status: INITIAL  2.  Patient will report at least 25% improvement in symptoms for improved quality of life. Baseline:  Goal status: INITIAL    LONG TERM GOALS: Target date: 08/27/2021  Patient will report at least 75% improvement in symptoms for improved quality of life. Baseline:  Goal status: INITIAL  2.  Patient will be able to complete 5x STS in under 11.4 seconds in order to reduce the risk of falls. Baseline:  Goal status: INITIAL  3.  Patient will be able to ambulate at least 226 feet in with LRAD in order to demonstrate improved gait speed for community ambulation.  Baseline:  Goal status: INITIAL  4.  Patient will be able to navigate stairs with  reciprocal pattern with minimal compensation in order to demonstrate improved LE strength. Baseline: see above Goal status: INITIAL     PLAN: PT FREQUENCY: 1-2x/week  PT DURATION: 6 weeks  PLANNED INTERVENTIONS: Therapeutic exercises, Therapeutic activity, Neuromuscular re-education, Balance training, Gait training, Patient/Family education, Joint manipulation, Joint mobilization, Stair training, Orthotic/Fit training, DME instructions, Aquatic Therapy, Dry Needling, Electrical stimulation, Spinal manipulation, Spinal mobilization, Cryotherapy, Moist heat, Compression bandaging, scar mobilization, Splintting, Taping, Traction, Ultrasound, Ionotophoresis 4mg /ml Dexamethasone, and Manual therapy  PLAN FOR NEXT SESSION: gait training with LRAD, core and hip strength training, balance training, test 5x STS   , PT 07/16/2021, 3:24 PM

## 2021-07-24 ENCOUNTER — Encounter (HOSPITAL_COMMUNITY): Payer: Self-pay | Admitting: Physical Therapy

## 2021-07-24 ENCOUNTER — Ambulatory Visit (HOSPITAL_COMMUNITY): Payer: 59 | Attending: Physician Assistant | Admitting: Physical Therapy

## 2021-07-24 DIAGNOSIS — I129 Hypertensive chronic kidney disease with stage 1 through stage 4 chronic kidney disease, or unspecified chronic kidney disease: Secondary | ICD-10-CM | POA: Insufficient documentation

## 2021-07-24 DIAGNOSIS — N183 Chronic kidney disease, stage 3 unspecified: Secondary | ICD-10-CM | POA: Insufficient documentation

## 2021-07-24 DIAGNOSIS — F17200 Nicotine dependence, unspecified, uncomplicated: Secondary | ICD-10-CM | POA: Diagnosis not present

## 2021-07-24 DIAGNOSIS — R69 Illness, unspecified: Secondary | ICD-10-CM | POA: Diagnosis not present

## 2021-07-24 DIAGNOSIS — Z89511 Acquired absence of right leg below knee: Secondary | ICD-10-CM | POA: Insufficient documentation

## 2021-07-24 DIAGNOSIS — M6281 Muscle weakness (generalized): Secondary | ICD-10-CM

## 2021-07-24 DIAGNOSIS — I739 Peripheral vascular disease, unspecified: Secondary | ICD-10-CM | POA: Diagnosis not present

## 2021-07-24 DIAGNOSIS — R2689 Other abnormalities of gait and mobility: Secondary | ICD-10-CM | POA: Insufficient documentation

## 2021-07-24 DIAGNOSIS — E1122 Type 2 diabetes mellitus with diabetic chronic kidney disease: Secondary | ICD-10-CM | POA: Insufficient documentation

## 2021-07-24 NOTE — Therapy (Signed)
OUTPATIENT PHYSICAL THERAPY TREATMENT NOTE   Patient Name: Christy [name] MRN: [id number] DOB:1960-04-24, 61 y.o., female Today's Date: 07/24/2021  PCP: Dion Saucier, PA-C  REFERRING PROVIDER: Dion Saucier, PA-C   END OF SESSION:   PT End of Session - 07/24/21 1305     Visit Number 2    Number of Visits 12    Date for PT Re-Evaluation 08/27/21    Authorization Type [employer] (no auth, 35 visits combined PT/OT/LT, 3 used/ 32 remaining)    Authorization - Visit Number 2    Authorization - Number of Visits 32    Progress Note Due on Visit 10    PT Start Time 1302    PT Stop Time 1345    PT Time Calculation (min) 43 min    Activity Tolerance Patient tolerated treatment well    Behavior During Therapy WFL for tasks assessed/performed             Past Medical History:  Diagnosis Date   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   Arthritis    Chronic kidney disease    recent UTI   Coronary artery disease    Depression 27-Jun-2010   Following sudden death of husband   Diabetes mellitus, type 2 (HCC)    Hypertension    Kidney cysts    Metabolic syndrome    Elevated triglycerides, low HDL, fasting hyperglycemia with low-dose sulfonylurea   Myocardial infarction (HCC) 09/2010   PONV (postoperative nausea and vomiting)    Tobacco abuse    60-90-pack-year history; 1/3 pack per day in 2011/06/27   Past Surgical History:  Procedure Laterality Date   ABDOMINAL AORTAGRAM N/A 10/09/2013   Procedure: ABDOMINAL Ronny Flurry;  Surgeon: Chuck Hint, MD;  Location: [facility] CATH LAB;  Service: Cardiovascular;  Laterality: N/A;   AMPUTATION Right 10/19/2013   Procedure: AMPUTATION DIGIT-RIGHT 4TH TOE AMPUTATION;  Surgeon: Chuck Hint, MD;  Location: [facility] OR;  Service: Vascular;  Laterality: Right;   AMPUTATION Right 03/14/2021   Procedure: RIGHT BELOW KNEE AMPUTATION;  Surgeon: Leonie Douglas, MD;  Location: [facility] OR;  Service: Vascular;   Laterality: Right;   APPLICATION OF WOUND VAC Right 11/24/2012   Procedure: APPLICATION OF WOUND VAC;  Surgeon: Dallas Schimke, DPM;  Location: [facility] ORS;  Service: Orthopedics;  Laterality: Right;   BILATERAL OOPHORECTOMY  Jun 26, 2009   for benign mass   BREAST SURGERY Left    brest biopsy   CHOLECYSTECTOMY     CORONARY ANGIOPLASTY WITH STENT PLACEMENT  10/01/10   DES x2   FEMORAL-POPLITEAL BYPASS GRAFT Right 10/19/2013   Procedure: RIGHT FEMORAL-BELOW KNEE POPLITEAL ARTERY BYPASS GRAFT;  Surgeon: Chuck Hint, MD;  Location: [facility] OR;  Service: Vascular;  Laterality: Right;   INCISION AND DRAINAGE OF WOUND Right 11/24/2012   Procedure: DEBRIDEMENT WOUND FOOT;  Surgeon: Dallas Schimke, DPM;  Location: [facility] ORS;  Service: Orthopedics;  Laterality: Right;   INCISION AND DRAINAGE OF WOUND Right 03/23/2013   Procedure: DEBRIDEMENT OF INFECTED BONE 5TH METATARSAL RIGHT FOOT;  Surgeon: Dallas Schimke, DPM;  Location: [facility] ORS;  Service: Orthopedics;  Laterality: Right;   INTRAOPERATIVE ARTERIOGRAM Right 10/19/2013   Procedure: INTRA OPERATIVE ARTERIOGRAM;  Surgeon: Chuck Hint, MD;  Location: [facility] OR;  Service: Vascular;  Laterality: Right;   METATARSAL HEAD EXCISION Right 11/03/2012   Procedure: AMPUTATION OF RIGHT FIFTH TOE AND PORTION OF FIFTH METATARSAL;  Surgeon: Sharlet Salina  Theola Sequin, DPM;  Location: [facility] ORS;  Service: Orthopedics;  Laterality: Right;   MINOR APPLICATION OF WOUND VAC Right 03/14/2021   Procedure: MINOR APPLICATION OF WOUND VAC;  Surgeon: Leonie Douglas, MD;  Location: [facility] OR;  Service: Vascular;  Laterality: Right;   TOE AMPUTATION     Patient Active Problem List   Diagnosis Date Noted   Osteomyelitis of right foot (HCC) 03/10/2021   Diabetic foot ulcer (HCC) 03/10/2021   Atherosclerosis of artery of extremity with ulceration (HCC) 01/24/2014   Ischemic ulcer of right foot (HCC) 11/22/2013   PAD (peripheral artery disease) (HCC) 10/19/2013    Atherosclerotic PVD with ulceration (HCC) 10/05/2013   Metabolic syndrome    Morbid obesity with BMI of 40.0-44.9, adult (HCC) 12/17/2011   Hyperlipidemia 07/03/2011   Diabetes mellitus, type 2 (HCC)    Reactive depression (situational) 10/17/2010   Hypertension 10/17/2010   Tobacco abuse 10/17/2010   Arteriosclerotic cardiovascular disease (ASCVD) 09/17/2010    REFERRING DIAG: amputation of RLE below knee   THERAPY DIAG:  S/P BKA (below knee amputation), right (HCC)  Muscle weakness (generalized)  Other abnormalities of gait and mobility  Rationale for Evaluation and Treatment Rehabilitation  PERTINENT HISTORY: hx of Type II DM, HTN, CKD III, tobacco abuse, charcot foot, and peripheral artery disease with history of RLE bypass and 4th and 5th toe amputations   PRECAUTIONS: Fall  SUBJECTIVE: Patient doing well. Doing fine with HEP. Has been walking with cane since yesterday, doing well so far.   PAIN:  Are you having pain? Yes: NPRS scale: 5/10 Pain location: end of incisions (RT knee) and shin bone  Pain description: sore Aggravating factors: WB Relieving factors: Non WB  OBJECTIVE:      COGNITION:           Overall cognitive status: Within functional limits for tasks assessed                          SENSATION: WFL         POSTURE: No Significant postural limitations   PALPATION: Healed incision with lateral edge rolled   LOWER EXTREMITY ROM:  Decreased hip extension bilaterally in standing   Active ROM Right eval Left eval  Hip flexion      Hip extension      Hip abduction      Hip adduction      Hip internal rotation      Hip external rotation      Knee flexion      Knee extension      Ankle dorsiflexion      Ankle plantarflexion      Ankle inversion      Ankle eversion       (Blank rows = not tested)   LOWER EXTREMITY MMT:   MMT Right eval Left eval  Hip flexion      Hip extension      Hip abduction      Hip adduction      Hip  internal rotation      Hip external rotation      Knee flexion 5/5    Knee extension 5/5    Ankle dorsiflexion      Ankle plantarflexion      Ankle inversion      Ankle eversion       (Blank rows = not tested)     FUNCTIONAL TESTS:  Stairs: able to complete with alternating pattern with  heavy UE support SLS: <2 seconds bilateral Transfers: labored with bilateral UE support  07/24/21 5 x STS: 23. 5 sec with hands on thighs    GAIT: Distance walked: 200 Assistive device utilized: Walker - 2 wheeled and None Level of assistance: CGA Comments: able to ambulate with and without RW, wide BOS limited hip and knee flexion bilaterally, L hip pain increasing throughout, decreased hip extension and trunk flexed       TODAY'S TREATMENT: 07/24/21 Goal review  5 x STS Seated LAQ 10 x 3" Standing hip abduction 3 x 10 Standing hip extension 3 x 10  Mini squat at counter 3 x 10 Standing knee flexion 3 x 10 Step taps on 6 inch step x 30  Nu step 5 min EOS for ROM and LE strength lv 3 (seat 9)  07/16/21 Standing hip abduction 2x 10 bilateral      PATIENT EDUCATION:  Education details: Patient educated on exam findings, POC, scope of PT, HEP, and skin checks. Person educated: Patient Education method: Explanation, Demonstration, and Handouts Education comprehension: verbalized understanding, returned demonstration, verbal cues required, and tactile cues required   HOME EXERCISE PROGRAM:  5/31/23Access Code: [email or web address] - Standing Hip Abduction with Counter Support  - 3 x daily - 7 x weekly - 2 sets - 10 reps  07/24/21 Hip extension Mini squat Standing knee flexion  ASSESSMENT:   CLINICAL IMPRESSION: Patient tolerated session well today. Reviewed therapy goals and HEP. Progressed LE strengthening with added standing there ex listed above. Patient cued on form and mechanics for max benefit and target muscle activation. Updated HEP and issued handout. Patient noting mild fatigue end of  session but no increased pain level. Patient will continue to benefit from skilled therapy services to reduce remaining deficits and improve functional ability.     OBJECTIVE IMPAIRMENTS Abnormal gait, decreased activity tolerance, decreased balance, decreased knowledge of use of DME, decreased mobility, difficulty walking, decreased ROM, decreased strength, increased edema, impaired flexibility, and improper body mechanics.    ACTIVITY LIMITATIONS carrying, lifting, bending, standing, squatting, stairs, transfers, locomotion level, and caring for others   PARTICIPATION LIMITATIONS: meal prep, cleaning, laundry, driving, community activity, and yard work   PERSONAL FACTORS Fitness, Past/current experiences, Time since onset of injury/illness/exacerbation, and 3+ comorbidities: CAD, metabolic syndrome, Hx MI, hx toe ambutations, RLE bypass graft, CKD  are also affecting patient's functional outcome.    REHAB POTENTIAL: Good   CLINICAL DECISION MAKING: Stable/uncomplicated   EVALUATION COMPLEXITY: Low     GOALS: Goals reviewed with patient? Yes   SHORT TERM GOALS: Target date: 08/06/2021   Patient will be independent with HEP in order to improve functional outcomes. Baseline:  Goal status: INITIAL   2.  Patient will report at least 25% improvement in symptoms for improved quality of life. Baseline:  Goal status: INITIAL       LONG TERM GOALS: Target date: 08/27/2021   Patient will report at least 75% improvement in symptoms for improved quality of life. Baseline:  Goal status: INITIAL   2.  Patient will be able to complete 5x STS in under 11.4 seconds in order to reduce the risk of falls. Baseline:  Goal status: INITIAL   3.  Patient will be able to ambulate at least 226 feet in with LRAD in order to demonstrate improved gait speed for community ambulation.  Baseline:  Goal status: INITIAL   4.  Patient will be able to navigate stairs with reciprocal pattern with  minimal compensation  in order to demonstrate improved LE strength. Baseline: see above Goal status: INITIAL         PLAN: PT FREQUENCY: 1-2x/week   PT DURATION: 6 weeks   PLANNED INTERVENTIONS: Therapeutic exercises, Therapeutic activity, Neuromuscular re-education, Balance training, Gait training, Patient/Family education, Joint manipulation, Joint mobilization, Stair training, Orthotic/Fit training, DME instructions, Aquatic Therapy, Dry Needling, Electrical stimulation, Spinal manipulation, Spinal mobilization, Cryotherapy, Moist heat, Compression bandaging, scar mobilization, Splintting, Taping, Traction, Ultrasound, Ionotophoresis 4mg /ml Dexamethasone, and Manual therapy   PLAN FOR NEXT SESSION: Add weight to LAQ. Gait training with LRAD, core and hip strength training, balance training     1:45 PM, 07/24/21 Georges Lynchameron Jonty Morrical PT DPT  Physical Therapist with [facility]  [phone]

## 2021-07-30 ENCOUNTER — Ambulatory Visit (HOSPITAL_COMMUNITY): Payer: 59 | Attending: Physician Assistant | Admitting: Physical Therapy

## 2021-07-30 ENCOUNTER — Encounter (HOSPITAL_COMMUNITY): Payer: Self-pay | Admitting: Physical Therapy

## 2021-07-30 DIAGNOSIS — Z89511 Acquired absence of right leg below knee: Secondary | ICD-10-CM | POA: Insufficient documentation

## 2021-07-30 DIAGNOSIS — M6281 Muscle weakness (generalized): Secondary | ICD-10-CM | POA: Diagnosis not present

## 2021-07-30 DIAGNOSIS — R2689 Other abnormalities of gait and mobility: Secondary | ICD-10-CM | POA: Diagnosis not present

## 2021-07-30 NOTE — Therapy (Signed)
OUTPATIENT PHYSICAL THERAPY TREATMENT NOTE   Patient Name: Christy [name] MRN: [id number] DOB:08/12/1960, 61 y.o., female Today's Date: 07/30/2021  PCP: Dion Saucier, PA-C  REFERRING PROVIDER: Dion Saucier, PA-C   END OF SESSION:   PT End of Session - 07/30/21 1517     Visit Number 3    Number of Visits 12    Date for PT Re-Evaluation 08/27/21    Authorization Type [employer]et[employer] (no auth, 35 visits combined PT/OT/LT, 3 used/ 32 remaining)    Authorization - Visit Number 3    Authorization - Number of Visits 32    Progress Note Due on Visit 10    PT Start Time 1514    PT Stop Time 1554    PT Time Calculation (min) 40 min    Activity Tolerance Patient tolerated treatment well    Behavior During Therapy WFL for tasks assessed/performed             Past Medical History:  Diagnosis Date   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   Arthritis    Chronic kidney disease    recent UTI   Coronary artery disease    Depression 2010-07-10   Following sudden death of husband   Diabetes mellitus, type 2 (HCC)    Hypertension    Kidney cysts    Metabolic syndrome    Elevated triglycerides, low HDL, fasting hyperglycemia with low-dose sulfonylurea   Myocardial infarction (HCC) 09/2010   PONV (postoperative nausea and vomiting)    Tobacco abuse    60-90-pack-year history; 1/3 pack per day in 07/10/2011   Past Surgical History:  Procedure Laterality Date   ABDOMINAL AORTAGRAM N/A 10/09/2013   Procedure: ABDOMINAL Ronny Flurry;  Surgeon: Chuck Hint, MD;  Location: [facility] CATH LAB;  Service: Cardiovascular;  Laterality: N/A;   AMPUTATION Right 10/19/2013   Procedure: AMPUTATION DIGIT-RIGHT 4TH TOE AMPUTATION;  Surgeon: Chuck Hint, MD;  Location: [facility] OR;  Service: Vascular;  Laterality: Right;   AMPUTATION Right 03/14/2021   Procedure: RIGHT BELOW KNEE AMPUTATION;  Surgeon: Leonie Douglas, MD;  Location: [facility] OR;  Service: Vascular;   Laterality: Right;   APPLICATION OF WOUND VAC Right 11/24/2012   Procedure: APPLICATION OF WOUND VAC;  Surgeon: Dallas Schimke, DPM;  Location: [facility] ORS;  Service: Orthopedics;  Laterality: Right;   BILATERAL OOPHORECTOMY  09-Jul-2009   for benign mass   BREAST SURGERY Left    brest biopsy   CHOLECYSTECTOMY     CORONARY ANGIOPLASTY WITH STENT PLACEMENT  10/01/10   DES x2   FEMORAL-POPLITEAL BYPASS GRAFT Right 10/19/2013   Procedure: RIGHT FEMORAL-BELOW KNEE POPLITEAL ARTERY BYPASS GRAFT;  Surgeon: Chuck Hint, MD;  Location: [facility] OR;  Service: Vascular;  Laterality: Right;   INCISION AND DRAINAGE OF WOUND Right 11/24/2012   Procedure: DEBRIDEMENT WOUND FOOT;  Surgeon: Dallas Schimke, DPM;  Location: [facility] ORS;  Service: Orthopedics;  Laterality: Right;   INCISION AND DRAINAGE OF WOUND Right 03/23/2013   Procedure: DEBRIDEMENT OF INFECTED BONE 5TH METATARSAL RIGHT FOOT;  Surgeon: Dallas Schimke, DPM;  Location: [facility];  Service: Orthopedics;  Laterality: Right;   INTRAOPERATIVE ARTERIOGRAM Right 10/19/2013   Procedure: INTRA OPERATIVE ARTERIOGRAM;  Surgeon: Chuck Hint, MD;  Location: [facility] OR;  Service: Vascular;  Laterality: Right;   METATARSAL HEAD EXCISION Right 11/03/2012   Procedure: AMPUTATION OF RIGHT FIFTH TOE AND PORTION OF FIFTH METATARSAL;  Surgeon: Sharlet Salina  Theola Sequin, DPM;  Location: [facility] ORS;  Service: Orthopedics;  Laterality: Right;   MINOR APPLICATION OF WOUND VAC Right 03/14/2021   Procedure: MINOR APPLICATION OF WOUND VAC;  Surgeon: Leonie Douglas, MD;  Location: [facility] OR;  Service: Vascular;  Laterality: Right;   TOE AMPUTATION     Patient Active Problem List   Diagnosis Date Noted   Osteomyelitis of right foot (HCC) 03/10/2021   Diabetic foot ulcer (HCC) 03/10/2021   Atherosclerosis of artery of extremity with ulceration (HCC) 01/24/2014   Ischemic ulcer of right foot (HCC) 11/22/2013   PAD (peripheral artery disease) (HCC) 10/19/2013    Atherosclerotic PVD with ulceration (HCC) 10/05/2013   Metabolic syndrome    Morbid obesity with BMI of 40.0-44.9, adult (HCC) 12/17/2011   Hyperlipidemia 07/03/2011   Diabetes mellitus, type 2 (HCC)    Reactive depression (situational) 10/17/2010   Hypertension 10/17/2010   Tobacco abuse 10/17/2010   Arteriosclerotic cardiovascular disease (ASCVD) 09/17/2010    REFERRING DIAG: amputation of RLE below knee   THERAPY DIAG:  S/P BKA (below knee amputation), right (HCC)  Muscle weakness (generalized)  Other abnormalities of gait and mobility  Rationale for Evaluation and Treatment Rehabilitation  PERTINENT HISTORY: hx of Type II DM, HTN, CKD III, tobacco abuse, charcot foot, and peripheral artery disease with history of RLE bypass and 4th and 5th toe amputations   PRECAUTIONS: Fall  SUBJECTIVE: Patient doing well, getting better with cane. Had f/u with [facility] for readjustment of prosthetic which helped.   PAIN:  Are you having pain? No   OBJECTIVE:      COGNITION:           Overall cognitive status: Within functional limits for tasks assessed                          SENSATION: WFL         POSTURE: No Significant postural limitations   PALPATION: Healed incision with lateral edge rolled   LOWER EXTREMITY ROM:  Decreased hip extension bilaterally in standing   Active ROM Right eval Left eval  Hip flexion      Hip extension      Hip abduction      Hip adduction      Hip internal rotation      Hip external rotation      Knee flexion      Knee extension      Ankle dorsiflexion      Ankle plantarflexion      Ankle inversion      Ankle eversion       (Blank rows = not tested)   LOWER EXTREMITY MMT:   MMT Right eval Left eval  Hip flexion      Hip extension      Hip abduction      Hip adduction      Hip internal rotation      Hip external rotation      Knee flexion 5/5    Knee extension 5/5    Ankle dorsiflexion      Ankle plantarflexion       Ankle inversion      Ankle eversion       (Blank rows = not tested)     FUNCTIONAL TESTS:  Stairs: able to complete with alternating pattern with heavy UE support SLS: <2 seconds bilateral Transfers: labored with bilateral UE support  07/24/21 5 x STS: 23. 5 sec with hands  on thighs    GAIT: Distance walked: 200 Assistive device utilized: Walker - 2 wheeled and None Level of assistance: CGA Comments: able to ambulate with and without RW, wide BOS limited hip and knee flexion bilaterally, L hip pain increasing throughout, decreased hip extension and trunk flexed       TODAY'S TREATMENT: 07/30/21 Nu step lv 3 (seat 9) 6 min for strength and ROM   Standing: Standing march with 2lb weights 3 x10 Standing hip abduction with 2lb weight 3 x 10 Standing hip extension with 2lb weight3 x 10  Mini squat at counter with 2lb weight3 x 10 Standing knee flexion with 2lb weight3 x 10 Step taps on 6 inch step with 2lb weights x 30 HHA x 1  Step ups on 4 inch box x15 each  Sidestepping in // bars 3RT  Seated:   Seated LAQ with 2 lb weights 3 x 10  Walking:   Gait in clinic 1 RT no AD, SBA    07/24/21 Goal review  5 x STS Seated LAQ 10 x 3" Standing hip abduction 3 x 10 Standing hip extension 3 x 10  Mini squat at counter 3 x 10 Standing knee flexion 3 x 10 Step taps on 6 inch step x 30  Nu step 5 min EOS for ROM and LE strength lv 3 (seat 9)  07/16/21 Standing hip abduction 2x 10 bilateral      PATIENT EDUCATION:  Education details: Patient educated on exam findings, POC, scope of PT, HEP, and skin checks. Person educated: Patient Education method: Explanation, Demonstration, and Handouts Education comprehension: verbalized understanding, returned demonstration, verbal cues required, and tactile cues required   HOME EXERCISE PROGRAM:  5/31/23Access Code: [email or web address] - Standing Hip Abduction with Counter Support  - 3 x daily - 7 x weekly - 2 sets - 10 reps  07/24/21 Hip  extension Mini squat Standing knee flexion  ASSESSMENT:   CLINICAL IMPRESSION: Patient doing well. Progressed LE strengthening with adding 2lb ankle weight to standing exercise. Good balance and stability with step taps and able to perform step ups today using HHA x 1. Patient still limited by decreased dynamic balance and gait speed during ambulation. Patient will continue to benefit from skilled therapy services to reduce remaining deficits and improve functional ability.      OBJECTIVE IMPAIRMENTS Abnormal gait, decreased activity tolerance, decreased balance, decreased knowledge of use of DME, decreased mobility, difficulty walking, decreased ROM, decreased strength, increased edema, impaired flexibility, and improper body mechanics.    ACTIVITY LIMITATIONS carrying, lifting, bending, standing, squatting, stairs, transfers, locomotion level, and caring for others   PARTICIPATION LIMITATIONS: meal prep, cleaning, laundry, driving, community activity, and yard work   PERSONAL FACTORS Fitness, Past/current experiences, Time since onset of injury/illness/exacerbation, and 3+ comorbidities: CAD, metabolic syndrome, Hx MI, hx toe ambutations, RLE bypass graft, CKD  are also affecting patient's functional outcome.    REHAB POTENTIAL: Good   CLINICAL DECISION MAKING: Stable/uncomplicated   EVALUATION COMPLEXITY: Low     GOALS: Goals reviewed with patient? Yes   SHORT TERM GOALS: Target date: 08/06/2021   Patient will be independent with HEP in order to improve functional outcomes. Baseline:  Goal status: INITIAL   2.  Patient will report at least 25% improvement in symptoms for improved quality of life. Baseline:  Goal status: INITIAL       LONG TERM GOALS: Target date: 08/27/2021   Patient will report at least 75% improvement in symptoms for improved quality of  life. Baseline:  Goal status: INITIAL   2.  Patient will be able to complete 5x STS in under 11.4 seconds in order  to reduce the risk of falls. Baseline:  Goal status: INITIAL   3.  Patient will be able to ambulate at least 226 feet in with LRAD in order to demonstrate improved gait speed for community ambulation.  Baseline:  Goal status: INITIAL   4.  Patient will be able to navigate stairs with reciprocal pattern with minimal compensation in order to demonstrate improved LE strength. Baseline: see above Goal status: INITIAL         PLAN: PT FREQUENCY: 1-2x/week   PT DURATION: 6 weeks   PLANNED INTERVENTIONS: Therapeutic exercises, Therapeutic activity, Neuromuscular re-education, Balance training, Gait training, Patient/Family education, Joint manipulation, Joint mobilization, Stair training, Orthotic/Fit training, DME instructions, Aquatic Therapy, Dry Needling, Electrical stimulation, Spinal manipulation, Spinal mobilization, Cryotherapy, Moist heat, Compression bandaging, scar mobilization, Splintting, Taping, Traction, Ultrasound, Ionotophoresis 4mg /ml Dexamethasone, and Manual therapy   PLAN FOR NEXT SESSION: Gait training with LRAD, core and hip strength training, balance training     3:55 PM, 07/30/21 08/01/21 PT DPT  Physical Therapist with [facility]  [facility]  [phone]

## 2021-08-06 ENCOUNTER — Ambulatory Visit (HOSPITAL_COMMUNITY): Payer: 59 | Attending: Physician Assistant

## 2021-08-06 ENCOUNTER — Encounter (HOSPITAL_COMMUNITY): Payer: Self-pay

## 2021-08-06 DIAGNOSIS — I129 Hypertensive chronic kidney disease with stage 1 through stage 4 chronic kidney disease, or unspecified chronic kidney disease: Secondary | ICD-10-CM | POA: Diagnosis not present

## 2021-08-06 DIAGNOSIS — M6281 Muscle weakness (generalized): Secondary | ICD-10-CM | POA: Insufficient documentation

## 2021-08-06 DIAGNOSIS — R29898 Other symptoms and signs involving the musculoskeletal system: Secondary | ICD-10-CM | POA: Insufficient documentation

## 2021-08-06 DIAGNOSIS — R2689 Other abnormalities of gait and mobility: Secondary | ICD-10-CM | POA: Diagnosis not present

## 2021-08-06 DIAGNOSIS — E8721 Acute metabolic acidosis: Secondary | ICD-10-CM | POA: Diagnosis not present

## 2021-08-06 DIAGNOSIS — N189 Chronic kidney disease, unspecified: Secondary | ICD-10-CM | POA: Diagnosis not present

## 2021-08-06 DIAGNOSIS — E871 Hypo-osmolality and hyponatremia: Secondary | ICD-10-CM | POA: Diagnosis not present

## 2021-08-06 DIAGNOSIS — E1122 Type 2 diabetes mellitus with diabetic chronic kidney disease: Secondary | ICD-10-CM | POA: Diagnosis not present

## 2021-08-06 DIAGNOSIS — I739 Peripheral vascular disease, unspecified: Secondary | ICD-10-CM | POA: Diagnosis not present

## 2021-08-06 DIAGNOSIS — Z6836 Body mass index (BMI) 36.0-36.9, adult: Secondary | ICD-10-CM | POA: Diagnosis not present

## 2021-08-06 DIAGNOSIS — Z89511 Acquired absence of right leg below knee: Secondary | ICD-10-CM | POA: Diagnosis not present

## 2021-08-06 DIAGNOSIS — K760 Fatty (change of) liver, not elsewhere classified: Secondary | ICD-10-CM | POA: Diagnosis not present

## 2021-08-06 DIAGNOSIS — D638 Anemia in other chronic diseases classified elsewhere: Secondary | ICD-10-CM | POA: Diagnosis not present

## 2021-08-06 DIAGNOSIS — I7090 Unspecified atherosclerosis: Secondary | ICD-10-CM | POA: Diagnosis not present

## 2021-08-06 NOTE — Therapy (Signed)
OUTPATIENT PHYSICAL THERAPY TREATMENT NOTE   Patient Name: Christy [name] MRN: [id number] DOB:[birth year], 61 y.o., female Today's Date: 08/06/2021  PCP: Dion Saucier, PA-C  REFERRING PROVIDER: Dion Saucier, PA-C   END OF SESSION:   PT End of Session - 08/06/21 1411     Visit Number 4    Number of Visits 12    Date for PT Re-Evaluation 08/27/21    Authorization Type [employer] (no auth, 35 visits combined PT/OT/LT, 3 used/ 32 remaining)    Authorization - Visit Number 4    Authorization - Number of Visits 32    Progress Note Due on Visit 10    PT Start Time 1405    PT Stop Time 1446    PT Time Calculation (min) 41 min    Activity Tolerance Patient tolerated treatment well    Behavior During Therapy WFL for tasks assessed/performed              Past Medical History:  Diagnosis Date   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   Arthritis    Chronic kidney disease    recent UTI   Coronary artery disease    Depression 2010-06-20   Following sudden death of husband   Diabetes mellitus, type 2 (HCC)    Hypertension    Kidney cysts    Metabolic syndrome    Elevated triglycerides, low HDL, fasting hyperglycemia with low-dose sulfonylurea   Myocardial infarction (HCC) 09/2010   PONV (postoperative nausea and vomiting)    Tobacco abuse    60-90-pack-year history; 1/3 pack per day in 06-20-2011   Past Surgical History:  Procedure Laterality Date   ABDOMINAL AORTAGRAM N/A 10/09/2013   Procedure: ABDOMINAL Ronny Flurry;  Surgeon: Chuck Hint, MD;  Location: [facility] CATH LAB;  Service: Cardiovascular;  Laterality: N/A;   AMPUTATION Right 10/19/2013   Procedure: AMPUTATION DIGIT-RIGHT 4TH TOE AMPUTATION;  Surgeon: Chuck Hint, MD;  Location: [facility] OR;  Service: Vascular;  Laterality: Right;   AMPUTATION Right 03/14/2021   Procedure: RIGHT BELOW KNEE AMPUTATION;  Surgeon: Leonie Douglas, MD;  Location: [facility] OR;  Service:  Vascular;  Laterality: Right;   APPLICATION OF WOUND VAC Right 11/24/2012   Procedure: APPLICATION OF WOUND VAC;  Surgeon: Dallas Schimke, DPM;  Location: [facility] ORS;  Service: Orthopedics;  Laterality: Right;   BILATERAL OOPHORECTOMY  06/19/2009   for benign mass   BREAST SURGERY Left    brest biopsy   CHOLECYSTECTOMY     CORONARY ANGIOPLASTY WITH STENT PLACEMENT  10/01/10   DES x2   FEMORAL-POPLITEAL BYPASS GRAFT Right 10/19/2013   Procedure: RIGHT FEMORAL-BELOW KNEE POPLITEAL ARTERY BYPASS GRAFT;  Surgeon: Chuck Hint, MD;  Location: [facility] OR;  Service: Vascular;  Laterality: Right;   INCISION AND DRAINAGE OF WOUND Right 11/24/2012   Procedure: DEBRIDEMENT WOUND FOOT;  Surgeon: Dallas Schimke, DPM;  Location: [facility] ORS;  Service: Orthopedics;  Laterality: Right;   INCISION AND DRAINAGE OF WOUND Right 03/23/2013   Procedure: DEBRIDEMENT OF INFECTED BONE 5TH METATARSAL RIGHT FOOT;  Surgeon: Dallas Schimke, DPM;  Location: [facility] ORS;  Service: Orthopedics;  Laterality: Right;   INTRAOPERATIVE ARTERIOGRAM Right 10/19/2013   Procedure: INTRA OPERATIVE ARTERIOGRAM;  Surgeon: Chuck Hint, MD;  Location: [facility] OR;  Service: Vascular;  Laterality: Right;   METATARSAL HEAD EXCISION Right 11/03/2012   Procedure: AMPUTATION OF RIGHT FIFTH TOE AND PORTION OF FIFTH METATARSAL;  Surgeon:  Dallas Schimke, DPM;  Location: [facility] ORS;  Service: Orthopedics;  Laterality: Right;   MINOR APPLICATION OF WOUND VAC Right 03/14/2021   Procedure: MINOR APPLICATION OF WOUND VAC;  Surgeon: Leonie Douglas, MD;  Location: [facility] OR;  Service: Vascular;  Laterality: Right;   TOE AMPUTATION     Patient Active Problem List   Diagnosis Date Noted   Osteomyelitis of right foot (HCC) 03/10/2021   Diabetic foot ulcer (HCC) 03/10/2021   Atherosclerosis of artery of extremity with ulceration (HCC) 01/24/2014   Ischemic ulcer of right foot (HCC) 11/22/2013   PAD (peripheral artery disease) (HCC) 10/19/2013    Atherosclerotic PVD with ulceration (HCC) 10/05/2013   Metabolic syndrome    Morbid obesity with BMI of 40.0-44.9, adult (HCC) 12/17/2011   Hyperlipidemia 07/03/2011   Diabetes mellitus, type 2 (HCC)    Reactive depression (situational) 10/17/2010   Hypertension 10/17/2010   Tobacco abuse 10/17/2010   Arteriosclerotic cardiovascular disease (ASCVD) 09/17/2010    REFERRING DIAG: amputation of RLE below knee   THERAPY DIAG:  S/P BKA (below knee amputation), right (HCC)  Muscle weakness (generalized)  Other abnormalities of gait and mobility  Other symptoms and signs involving the musculoskeletal system  Rationale for Evaluation and Treatment Rehabilitation  PERTINENT HISTORY: hx of Type II DM, HTN, CKD III, tobacco abuse, charcot foot, and peripheral artery disease with history of RLE bypass and 4th and 5th toe amputations   PRECAUTIONS: Fall  SUBJECTIVE: Patient doing well, getting better with cane. Had f/u with [facility] for readjustment of prosthetic which helped.   Pt arrived ambulating with SPC, reports some soreness on shin.  Has been looking at limb with no reports of redness or irritation.  Reports compliance with HEP.    PAIN:  Are you having pain? No   OBJECTIVE:      COGNITION:           Overall cognitive status: Within functional limits for tasks assessed                          SENSATION: WFL         POSTURE: No Significant postural limitations   PALPATION: Healed incision with lateral edge rolled   LOWER EXTREMITY ROM:  Decreased hip extension bilaterally in standing   Active ROM Right eval Left eval  Hip flexion      Hip extension      Hip abduction      Hip adduction      Hip internal rotation      Hip external rotation      Knee flexion      Knee extension      Ankle dorsiflexion      Ankle plantarflexion      Ankle inversion      Ankle eversion       (Blank rows = not tested)   LOWER EXTREMITY MMT:   MMT Right eval  Left eval  Hip flexion      Hip extension      Hip abduction      Hip adduction      Hip internal rotation      Hip external rotation      Knee flexion 5/5    Knee extension 5/5    Ankle dorsiflexion      Ankle plantarflexion      Ankle inversion      Ankle eversion       (Blank rows =  not tested)     FUNCTIONAL TESTS:  Stairs: able to complete with alternating pattern with heavy UE support SLS: <2 seconds bilateral Transfers: labored with bilateral UE support  07/24/21 5 x STS: 23. 5 sec with hands on thighs    GAIT: Distance walked: 200 Assistive device utilized: Walker - 2 wheeled and None Level of assistance: CGA Comments: able to ambulate with and without RW, wide BOS limited hip and knee flexion bilaterally, L hip pain increasing throughout, decreased hip extension and trunk flexed       TODAY'S TREATMENT: 08/06/21 Sidestep in // bars  no HHA 4RT 2# on ankles Marching 2x 15 reps with 2#  Squat at // bars with chair behind 3x 10 HHA Tandem stance 3x 30" intermittent HHA required with Rt LE weight bearing Forward step up 6in 15x each with 2 HHA   Step taps on 6 inch step with 2lb weights x 30 HHA x 1   272ft no AD, 1'40" Weighted prostetic limb 3.8#  07/30/21 Nu step lv 3 (seat 9) 6 min for strength and ROM   Standing: Standing march with 2lb weights 3 x10 Standing hip abduction with 2lb weight 3 x 10 Standing hip extension with 2lb weight3 x 10  Mini squat at counter with 2lb weight3 x 10 Standing knee flexion with 2lb weight3 x 10 Step taps on 6 inch step with 2lb weights x 30 HHA x 1  Step ups on 4 inch box x15 each  Sidestepping in // bars 3RT  Seated:   Seated LAQ with 2 lb weights 3 x 10  Walking:   Gait in clinic 1 RT no AD, SBA    07/24/21 Goal review  5 x STS Seated LAQ 10 x 3" Standing hip abduction 3 x 10 Standing hip extension 3 x 10  Mini squat at counter 3 x 10 Standing knee flexion 3 x 10 Step taps on 6 inch step x 30  Nu  step 5 min EOS for ROM and LE strength lv 3 (seat 9)  07/16/21 Standing hip abduction 2x 10 bilateral      PATIENT EDUCATION:  Education details: Patient educated on exam findings, POC, scope of PT, HEP, and skin checks. Person educated: Patient Education method: Explanation, Demonstration, and Handouts Education comprehension: verbalized understanding, returned demonstration, verbal cues required, and tactile cues required   HOME EXERCISE PROGRAM:  5/31/23Access Code: [email or web address] - Standing Hip Abduction with Counter Support  - 3 x daily - 7 x weekly - 2 sets - 10 reps  07/24/21 Hip extension Mini squat Standing knee flexion  ASSESSMENT:   CLINICAL IMPRESSION: Pt tolerated well to session with no reports of increased pain through session.  Pt did report fatigue with standing exercises with occasional seated rest break through session.  Added tandem stance for stability balance with good stability noted, did required intermittent HHA when increased WB-ing RT, able to stand with Lt without HHA required.  Pt does require HHA during functional strengthening exercises.  Noted sound of air in prosthetic limb with weight bearing, reviewed importance of sufficient socks to reduce friction for skin integrity with verbalized understanding.  complete at EOS, pt able to ambulate 275ft with no AD in 1'40" prior fatigue.      OBJECTIVE IMPAIRMENTS Abnormal gait, decreased activity tolerance, decreased balance, decreased knowledge of use of DME, decreased mobility, difficulty walking, decreased ROM, decreased strength, increased edema, impaired flexibility, and improper body mechanics.    ACTIVITY LIMITATIONS carrying, lifting,  bending, standing, squatting, stairs, transfers, locomotion level, and caring for others   PARTICIPATION LIMITATIONS: meal prep, cleaning, laundry, driving, community activity, and yard work   PERSONAL FACTORS Fitness, Past/current experiences, Time since onset of  injury/illness/exacerbation, and 3+ comorbidities: CAD, metabolic syndrome, Hx MI, hx toe ambutations, RLE bypass graft, CKD  are also affecting patient's functional outcome.    REHAB POTENTIAL: Good   CLINICAL DECISION MAKING: Stable/uncomplicated   EVALUATION COMPLEXITY: Low     GOALS: Goals reviewed with patient? Yes   SHORT TERM GOALS: Target date: 08/06/2021   Patient will be independent with HEP in order to improve functional outcomes. Baseline:  Goal status: Ongoing   2.  Patient will report at least 25% improvement in symptoms for improved quality of life. Baseline:  Goal status: Ongoing       LONG TERM GOALS: Target date: 08/27/2021   Patient will report at least 75% improvement in symptoms for improved quality of life. Baseline:  Goal status:Ongoing   2.  Patient will be able to complete 5x STS in under 11.4 seconds in order to reduce the risk of falls. Baseline:  Goal status: Ongoing   3.  Patient will be able to ambulate at least 226 feet in with LRAD in order to demonstrate improved gait speed for community ambulation.  Baseline: 08/06/21: 273ft no AD in 1'40" prior fatigue. Goal status: Achieved   4.  Patient will be able to navigate stairs with reciprocal pattern with minimal compensation in order to demonstrate improved LE strength. Baseline: see above Goal status: Ongoing         PLAN: PT FREQUENCY: 1-2x/week   PT DURATION: 6 weeks   PLANNED INTERVENTIONS: Therapeutic exercises, Therapeutic activity, Neuromuscular re-education, Balance training, Gait training, Patient/Family education, Joint manipulation, Joint mobilization, Stair training, Orthotic/Fit training, DME instructions, Aquatic Therapy, Dry Needling, Electrical stimulation, Spinal manipulation, Spinal mobilization, Cryotherapy, Moist heat, Compression bandaging, scar mobilization, Splintting, Taping, Traction, Ultrasound, Ionotophoresis 4mg /ml Dexamethasone, and Manual therapy   PLAN  FOR NEXT SESSION: Gait training with LRAD, core and hip strength training, balance training    , LPTA/CLT; CBIS [phone]  6:45 PM, 08/06/21

## 2021-08-13 ENCOUNTER — Encounter (HOSPITAL_COMMUNITY): Payer: Self-pay

## 2021-08-13 ENCOUNTER — Ambulatory Visit (HOSPITAL_COMMUNITY): Payer: 59 | Attending: Physician Assistant

## 2021-08-13 DIAGNOSIS — R29898 Other symptoms and signs involving the musculoskeletal system: Secondary | ICD-10-CM | POA: Diagnosis not present

## 2021-08-13 DIAGNOSIS — M6281 Muscle weakness (generalized): Secondary | ICD-10-CM | POA: Insufficient documentation

## 2021-08-13 DIAGNOSIS — R2689 Other abnormalities of gait and mobility: Secondary | ICD-10-CM | POA: Insufficient documentation

## 2021-08-13 NOTE — Therapy (Addendum)
OUTPATIENT PHYSICAL THERAPY TREATMENT NOTE   Patient Name: Christy [name] MRN: [id number] DOB:1961-01-06, 61 y.o., female Today's Date: 08/15/2021  PCP: Wanita Chamberlain, PA-C  REFERRING PROVIDER: Wanita Chamberlain, PA-C   END OF SESSION:    08/13/21 1357  PT Visits / Re-Eval  Visit Number 5  Number of Visits 12  Date for PT Re-Evaluation 08/27/21  Authorization  Authorization Type [employer] (no auth, 35 visits combined PT/OT/LT, 3 used/ 44 remaining)  Authorization - Visit Number 5  Authorization - Number of Visits 32  Progress Note Due on Visit 10  PT Time Calculation  PT Start Time May 29, 1314  PT Stop Time 1358  PT Time Calculation (min) 42 min  PT - End of Session  Activity Tolerance Patient tolerated treatment well  Behavior During Therapy WFL for tasks assessed/performed      Past Medical History:  Diagnosis Date   Arteriosclerotic cardiovascular disease (ASCVD) 09/2010   ST elevation MI  with DES to LAD X 2.   Arthritis    Chronic kidney disease    recent UTI   Coronary artery disease    Depression 2010-05-29   Following sudden death of husband   Diabetes mellitus, type 2 ([place])    Hypertension    Kidney cysts    Metabolic syndrome    Elevated triglycerides, low HDL, fasting hyperglycemia with low-dose sulfonylurea   Myocardial infarction ([place]) 09/2010   PONV (postoperative nausea and vomiting)    Tobacco abuse    60-90-pack-year history; 1/3 pack per day in 05/29/11   Past Surgical History:  Procedure Laterality Date   ABDOMINAL AORTAGRAM N/A 10/09/2013   Procedure: ABDOMINAL Maxcine Ham;  Surgeon: Angelia Mould, MD;  Location: [facility] CATH LAB;  Service: Cardiovascular;  Laterality: N/A;   AMPUTATION Right 10/19/2013   Procedure: AMPUTATION DIGIT-RIGHT 4TH TOE AMPUTATION;  Surgeon: Angelia Mould, MD;  Location: [place];  Service: Vascular;  Laterality: Right;   AMPUTATION Right 03/14/2021   Procedure: RIGHT BELOW KNEE AMPUTATION;  Surgeon: Cherre Robins, MD;   Location: [place];  Service: Vascular;  Laterality: Right;   APPLICATION OF WOUND VAC Right 11/24/2012   Procedure: APPLICATION OF WOUND VAC;  Surgeon: Marcheta Grammes, DPM;  Location: [facility];  Service: Orthopedics;  Laterality: Right;   BILATERAL OOPHORECTOMY  May 28, 2009   for benign mass   BREAST SURGERY Left    brest biopsy   CHOLECYSTECTOMY     CORONARY ANGIOPLASTY WITH STENT PLACEMENT  10/01/10   DES x2   FEMORAL-POPLITEAL BYPASS GRAFT Right 10/19/2013   Procedure: RIGHT FEMORAL-BELOW KNEE POPLITEAL ARTERY BYPASS GRAFT;  Surgeon: Angelia Mould, MD;  Location: [place];  Service: Vascular;  Laterality: Right;   INCISION AND DRAINAGE OF WOUND Right 11/24/2012   Procedure: DEBRIDEMENT WOUND FOOT;  Surgeon: Marcheta Grammes, DPM;  Location: [facility];  Service: Orthopedics;  Laterality: Right;   INCISION AND DRAINAGE OF WOUND Right 03/23/2013   Procedure: DEBRIDEMENT OF INFECTED BONE 5TH METATARSAL RIGHT FOOT;  Surgeon: Marcheta Grammes, DPM;  Location: [facility];  Service: Orthopedics;  Laterality: Right;   INTRAOPERATIVE ARTERIOGRAM Right 10/19/2013   Procedure: INTRA OPERATIVE ARTERIOGRAM;  Surgeon: Angelia Mould, MD;  Location: [place];  Service: Vascular;  Laterality: Right;   METATARSAL HEAD EXCISION Right 11/03/2012   Procedure: AMPUTATION OF RIGHT FIFTH TOE AND PORTION OF FIFTH METATARSAL;  Surgeon: Marcheta Grammes, DPM;  Location: [facility];  Service: Orthopedics;  Laterality: Right;   MINOR APPLICATION OF WOUND  VAC Right 03/14/2021   Procedure: MINOR APPLICATION OF WOUND VAC;  Surgeon: Cherre Robins, MD;  Location: [facility] OR;  Service: Vascular;  Laterality: Right;   TOE AMPUTATION     Patient Active Problem List   Diagnosis Date Noted   Osteomyelitis of right foot ([place]) 03/10/2021   Diabetic foot ulcer ([place]) 03/10/2021   Atherosclerosis of artery of extremity with ulceration ([place]) 01/24/2014   Ischemic ulcer of right foot ([place]) 11/22/2013   PAD (peripheral artery  disease) ([place]) 10/19/2013   Atherosclerotic PVD with ulceration ([place]) [id number]   Metabolic syndrome    Morbid obesity with BMI of 40.0-44.9, adult ([place]) 12/17/2011   Hyperlipidemia 07/03/2011   Diabetes mellitus, type 2 ([place])    Reactive depression (situational) 10/17/2010   Hypertension 10/17/2010   Tobacco abuse 10/17/2010   Arteriosclerotic cardiovascular disease (ASCVD) 09/17/2010    REFERRING DIAG: amputation of RLE below knee   THERAPY DIAG:  Muscle weakness (generalized)  Other abnormalities of gait and mobility  Other symptoms and signs involving the musculoskeletal system  Rationale for Evaluation and Treatment Rehabilitation  PERTINENT HISTORY: hx of Type II DM, HTN, CKD III, tobacco abuse, charcot foot, and peripheral artery disease with history of RLE bypass and 4th and 5th toe amputations   PRECAUTIONS: Fall  SUBJECTIVE:  Pt stated she is feeling good today.  Arrived ambulating with SPC, no reports of pain.  Stated she see returns to [facility] in ~3 weeks.  Reports she checks her skin integrity daily, no redness noted.    Eval subjective: She's been getting around alright. She would like to be able to get off the walker.She just got the leg at the beginning of April.     PAIN:  Are you having pain? No   OBJECTIVE:      COGNITION:           Overall cognitive status: Within functional limits for tasks assessed                          SENSATION: WFL         POSTURE: No Significant postural limitations   PALPATION: Healed incision with lateral edge rolled   LOWER EXTREMITY ROM:  Decreased hip extension bilaterally in standing   Active ROM Right eval Left eval  Hip flexion      Hip extension      Hip abduction      Hip adduction      Hip internal rotation      Hip external rotation      Knee flexion      Knee extension      Ankle dorsiflexion      Ankle plantarflexion      Ankle inversion      Ankle eversion       (Blank rows = not  tested)   LOWER EXTREMITY MMT:   MMT Right eval Left eval  Hip flexion      Hip extension      Hip abduction      Hip adduction      Hip internal rotation      Hip external rotation      Knee flexion 5/5    Knee extension 5/5    Ankle dorsiflexion      Ankle plantarflexion      Ankle inversion      Ankle eversion       (Blank rows = not tested)  FUNCTIONAL TESTS:  Stairs: able to complete with alternating pattern with heavy UE support SLS: <2 seconds bilateral Transfers: labored with bilateral UE support  07/24/21 5 x STS: 23. 5 sec with hands on thighs    GAIT: Distance walked: 200 Assistive device utilized: Walker - 2 wheeled and None Level of assistance: CGA Comments: able to ambulate with and without RW, wide BOS limited hip and knee flexion bilaterally, L hip pain increasing throughout, decreased hip extension and trunk flexed       TODAY'S TREATMENT: 08/13/21: 275ft Nustep L3 x 5 min  Sidestep with RTB around thigh in // bars  no HHA 5RT 2# on ankles Toe tapping 6in step, required 1 HHA Step up 6in 15x BLE required 2 HHA Vector stance 3 x 5" HHA Tandem stance 3x 30" Pallof 20x NBOS RTB    08/06/21 Sidestep in // bars  no HHA 4RT 2# on ankles Marching 2x 15 reps with 2#  Squat at // bars with chair behind 3x 10 HHA Tandem stance 3x 30" intermittent HHA required with Rt LE weight bearing Forward step up 6in 15x each with 2 HHA   Step taps on 6 inch step with 2lb weights x 30 HHA x 1   232ft no AD, 1'40" Weighted prostetic limb 3.8#  07/30/21 Nu step lv 3 (seat 9) 6 min for strength and ROM   Standing: Standing march with 2lb weights 3 x10 Standing hip abduction with 2lb weight 3 x 10 Standing hip extension with 2lb weight3 x 10  Mini squat at counter with 2lb weight3 x 10 Standing knee flexion with 2lb weight3 x 10 Step taps on 6 inch step with 2lb weights x 30 HHA x 1  Step ups on 4 inch box x15 each  Sidestepping in // bars  3RT  Seated:   Seated LAQ with 2 lb weights 3 x 10  Walking:   Gait in clinic 1 RT no AD, SBA    07/24/21 Goal review  5 x STS Seated LAQ 10 x 3" Standing hip abduction 3 x 10 Standing hip extension 3 x 10  Mini squat at counter 3 x 10 Standing knee flexion 3 x 10 Step taps on 6 inch step x 30  Nu step 5 min EOS for ROM and LE strength lv 3 (seat 9)  07/16/21 Standing hip abduction 2x 10 bilateral      PATIENT EDUCATION:  Education details: Patient educated on exam findings, POC, scope of PT, HEP, and skin checks. Person educated: Patient Education method: Explanation, Demonstration, and Handouts Education comprehension: verbalized understanding, returned demonstration, verbal cues required, and tactile cues required   HOME EXERCISE PROGRAM:  5/31/23Access Code: [email or web address] - Standing Hip Abduction with Counter Support  - 3 x daily - 7 x weekly - 2 sets - 10 reps  07/24/21 Hip extension Mini squat Standing knee flexion  ASSESSMENT:   CLINICAL IMPRESSION: Pt progressing well toward POC.  Presents with improved activity tolerance noted with ability to complete 366ft no AD in 2'.  Pt does continues to require seated rest breaks through session due to fatigue.  Added static balance exercises to HEP, encouraged to complete near counter at home for safety.  Added pallof for core strengthening to assist with static balance as well as additional RTB around thighs for sidestep and static vector stance.  Pt continues to require 1 HHA during balance training and 2 HHA during functional strengthening exercises.  No reports of pain through  session, was limited by fatigue.        OBJECTIVE IMPAIRMENTS Abnormal gait, decreased activity tolerance, decreased balance, decreased knowledge of use of DME, decreased mobility, difficulty walking, decreased ROM, decreased strength, increased edema, impaired flexibility, and improper body mechanics.    ACTIVITY LIMITATIONS carrying, lifting,  bending, standing, squatting, stairs, transfers, locomotion level, and caring for others   PARTICIPATION LIMITATIONS: meal prep, cleaning, laundry, driving, community activity, and yard work   PERSONAL FACTORS Fitness, Past/current experiences, Time since onset of injury/illness/exacerbation, and 3+ comorbidities: CAD, metabolic syndrome, Hx MI, hx toe ambutations, RLE bypass graft, CKD  are also affecting patient's functional outcome.    REHAB POTENTIAL: Good   CLINICAL DECISION MAKING: Stable/uncomplicated   EVALUATION COMPLEXITY: Low     GOALS: Goals reviewed with patient? Yes   SHORT TERM GOALS: Target date: 08/06/2021   Patient will be independent with HEP in order to improve functional outcomes. Baseline:  Goal status: Ongoing   2.  Patient will report at least 25% improvement in symptoms for improved quality of life. Baseline:  Goal status: Ongoing       LONG TERM GOALS: Target date: 08/27/2021   Patient will report at least 75% improvement in symptoms for improved quality of life. Baseline:  Goal status:Ongoing   2.  Patient will be able to complete 5x STS in under 11.4 seconds in order to reduce the risk of falls. Baseline:  Goal status: Ongoing   3.  Patient will be able to ambulate at least 226 feet in with LRAD in order to demonstrate improved gait speed for community ambulation.  Baseline: 08/06/21: 283ft no AD in 1'40" prior fatigue. Goal status: Achieved   4.  Patient will be able to navigate stairs with reciprocal pattern with minimal compensation in order to demonstrate improved LE strength. Baseline: see above Goal status: Ongoing         PLAN: PT FREQUENCY: 1-2x/week   PT DURATION: 6 weeks   PLANNED INTERVENTIONS: Therapeutic exercises, Therapeutic activity, Neuromuscular re-education, Balance training, Gait training, Patient/Family education, Joint manipulation, Joint mobilization, Stair training, Orthotic/Fit training, DME instructions,  Aquatic Therapy, Dry Needling, Electrical stimulation, Spinal manipulation, Spinal mobilization, Cryotherapy, Moist heat, Compression bandaging, scar mobilization, Splintting, Taping, Traction, Ultrasound, Ionotophoresis 4mg /ml Dexamethasone, and Manual therapy   PLAN FOR NEXT SESSION: Gait training with LRAD, core and hip strength training, balance training    , LPTA/CLT; CBIS [phone]  8:04 AM, 08/15/21

## 2021-08-20 ENCOUNTER — Ambulatory Visit (HOSPITAL_COMMUNITY): Payer: 59

## 2021-08-20 ENCOUNTER — Encounter (HOSPITAL_COMMUNITY): Payer: Self-pay

## 2021-08-20 ENCOUNTER — Telehealth (HOSPITAL_COMMUNITY): Payer: Self-pay

## 2021-08-20 NOTE — Telephone Encounter (Signed)
Pt arrived for therapy but then left due waiting too long.  Therapist called to apologize about schedule error.  Reminded next apt date and time.    Becky Sax, LPTA/CLT; [place] [phone]

## 2021-08-27 ENCOUNTER — Ambulatory Visit (HOSPITAL_COMMUNITY): Payer: 59 | Attending: Physician Assistant | Admitting: Physical Therapy

## 2021-08-27 ENCOUNTER — Encounter (HOSPITAL_COMMUNITY): Payer: Self-pay | Admitting: Physical Therapy

## 2021-08-27 DIAGNOSIS — R2689 Other abnormalities of gait and mobility: Secondary | ICD-10-CM | POA: Diagnosis not present

## 2021-08-27 DIAGNOSIS — Z89511 Acquired absence of right leg below knee: Secondary | ICD-10-CM | POA: Insufficient documentation

## 2021-08-27 DIAGNOSIS — M6281 Muscle weakness (generalized): Secondary | ICD-10-CM | POA: Diagnosis not present

## 2021-08-27 DIAGNOSIS — R29898 Other symptoms and signs involving the musculoskeletal system: Secondary | ICD-10-CM | POA: Diagnosis not present

## 2021-08-27 NOTE — Therapy (Signed)
OUTPATIENT PHYSICAL THERAPY TREATMENT NOTE   Patient Name: Christy [name] MRN: [id number] DOB:[birth year], 61 y.o., female Today's Date: 08/27/2021  PCP: Wanita Chamberlain, PA-C  REFERRING PROVIDER: Wanita Chamberlain, PA-C   PHYSICAL THERAPY DISCHARGE SUMMARY  Visits from Start of Care: 6  Current functional level related to goals / functional outcomes: See below   Remaining deficits: See below   Education / Equipment: See below   Patient agrees to discharge. Patient goals were met. Patient is being discharged due to being pleased with the current functional level.    PT End of Session - 08/27/21 1440     Visit Number 6    Number of Visits 12    Date for PT Re-Evaluation 08/27/21    Authorization Type [employer] (no auth, 35 visits combined PT/OT/LT, 3 used/ 79 remaining)    Authorization - Visit Number 6    Authorization - Number of Visits 32    Progress Note Due on Visit 10    PT Start Time 1440    PT Stop Time 1518    PT Time Calculation (min) 38 min    Activity Tolerance Patient tolerated treatment well    Behavior During Therapy WFL for tasks assessed/performed               Past Medical History:  Diagnosis Date   Arteriosclerotic cardiovascular disease (ASCVD) 09/2010   ST elevation MI  with DES to LAD X 2.   Arthritis    Chronic kidney disease    recent UTI   Coronary artery disease    Depression 18-Apr-2010   Following sudden death of husband   Diabetes mellitus, type 2 ([place])    Hypertension    Kidney cysts    Metabolic syndrome    Elevated triglycerides, low HDL, fasting hyperglycemia with low-dose sulfonylurea   Myocardial infarction ([place]) 09/2010   PONV (postoperative nausea and vomiting)    Tobacco abuse    60-90-pack-year history; 1/3 pack per day in Apr 19, 2011   Past Surgical History:  Procedure Laterality Date   ABDOMINAL AORTAGRAM N/A 10/09/2013   Procedure: ABDOMINAL Maxcine Ham;  Surgeon: Angelia Mould, MD;  Location: [facility] CATH LAB;  Service:  Cardiovascular;  Laterality: N/A;   AMPUTATION Right 10/19/2013   Procedure: AMPUTATION DIGIT-RIGHT 4TH TOE AMPUTATION;  Surgeon: Angelia Mould, MD;  Location: [place];  Service: Vascular;  Laterality: Right;   AMPUTATION Right 03/14/2021   Procedure: RIGHT BELOW KNEE AMPUTATION;  Surgeon: Cherre Robins, MD;  Location: [place];  Service: Vascular;  Laterality: Right;   APPLICATION OF WOUND VAC Right 11/24/2012   Procedure: APPLICATION OF WOUND VAC;  Surgeon: Marcheta Grammes, DPM;  Location: [facility];  Service: Orthopedics;  Laterality: Right;   BILATERAL OOPHORECTOMY  Apr 18, 2009   for benign mass   BREAST SURGERY Left    brest biopsy   CHOLECYSTECTOMY     CORONARY ANGIOPLASTY WITH STENT PLACEMENT  10/01/10   DES x2   FEMORAL-POPLITEAL BYPASS GRAFT Right 10/19/2013   Procedure: RIGHT FEMORAL-BELOW KNEE POPLITEAL ARTERY BYPASS GRAFT;  Surgeon: Angelia Mould, MD;  Location: [place];  Service: Vascular;  Laterality: Right;   INCISION AND DRAINAGE OF WOUND Right 11/24/2012   Procedure: DEBRIDEMENT WOUND FOOT;  Surgeon: Marcheta Grammes, DPM;  Location: [facility];  Service: Orthopedics;  Laterality: Right;   INCISION AND DRAINAGE OF WOUND Right 03/23/2013   Procedure: DEBRIDEMENT OF INFECTED BONE 5TH METATARSAL RIGHT FOOT;  Surgeon: Marcheta Grammes, DPM;  [facility];  Service: Orthopedics;  Laterality: Right;   INTRAOPERATIVE ARTERIOGRAM Right 10/19/2013   Procedure: INTRA OPERATIVE ARTERIOGRAM;  Surgeon: Angelia Mould, MD;  Location: [place];  Service: Vascular;  Laterality: Right;   METATARSAL HEAD EXCISION Right 11/03/2012   Procedure: AMPUTATION OF RIGHT FIFTH TOE AND PORTION OF FIFTH METATARSAL;  Surgeon: Marcheta Grammes, DPM;  [facility];  Service: Orthopedics;  Laterality: Right;   MINOR APPLICATION OF WOUND VAC Right 03/14/2021   Procedure: MINOR APPLICATION OF WOUND VAC;  Surgeon: Cherre Robins, MD;  Location: [place];  Service: Vascular;  Laterality:  Right;   TOE AMPUTATION     Patient Active Problem List   Diagnosis Date Noted   Osteomyelitis of right foot ([place]) 03/10/2021   Diabetic foot ulcer ([place]) 03/10/2021   Atherosclerosis of artery of extremity with ulceration ([place]) 01/24/2014   Ischemic ulcer of right foot ([place]) 11/22/2013   PAD (peripheral artery disease) ([place]) 10/19/2013   Atherosclerotic PVD with ulceration ([place]) 59/56/3875   Metabolic syndrome    Morbid obesity with BMI of 40.0-44.9, adult ([place]) 12/17/2011   Hyperlipidemia 07/03/2011   Diabetes mellitus, type 2 ([place])    Reactive depression (situational) 10/17/2010   Hypertension 10/17/2010   Tobacco abuse 10/17/2010   Arteriosclerotic cardiovascular disease (ASCVD) 09/17/2010    REFERRING DIAG: amputation of RLE below knee   THERAPY DIAG:  Muscle weakness (generalized)  Other abnormalities of gait and mobility  Other symptoms and signs involving the musculoskeletal system  S/P BKA (below knee amputation), right (HCC)  Rationale for Evaluation and Treatment Rehabilitation  PERTINENT HISTORY: hx of Type II DM, HTN, CKD III, tobacco abuse, charcot foot, and peripheral artery disease with history of RLE bypass and 4th and 5th toe amputations   PRECAUTIONS: Fall  SUBJECTIVE:  Patient states she was able drive yesterday and today. She has been using cane to get around. Her left hip bothers her. She has pain on shin bone if she leans too far forward. Patient states 100% improvement with PT intervention.  PAIN:  Are you having pain? No   OBJECTIVE:      COGNITION:           Overall cognitive status: Within functional limits for tasks assessed                          SENSATION: WFL         POSTURE: No Significant postural limitations   PALPATION: Healed incision with lateral edge rolled   LOWER EXTREMITY ROM:  Decreased hip extension bilaterally in standing   Active ROM Right eval Left eval  Hip flexion      Hip extension      Hip abduction       Hip adduction      Hip internal rotation      Hip external rotation      Knee flexion      Knee extension      Ankle dorsiflexion      Ankle plantarflexion      Ankle inversion      Ankle eversion       (Blank rows = not tested)   LOWER EXTREMITY MMT:   MMT Right eval Left eval  Hip flexion      Hip extension      Hip abduction      Hip adduction      Hip internal rotation      Hip  external rotation      Knee flexion 5/5    Knee extension 5/5    Ankle dorsiflexion      Ankle plantarflexion      Ankle inversion      Ankle eversion       (Blank rows = not tested)     FUNCTIONAL TESTS:  Stairs: able to complete with alternating pattern with heavy UE support SLS: <2 seconds bilateral Transfers: labored with bilateral UE support  07/24/21 5 x STS: 23. 5 sec with hands on thighs    GAIT: Distance walked: 200 Assistive device utilized: Walker - 2 wheeled and None Level of assistance: CGA Comments: able to ambulate with and without RW, wide BOS limited hip and knee flexion bilaterally, L hip pain increasing throughout, decreased hip extension and trunk flexed    Reassessment 08/27/21: 5x STS: 18.82 seconds with hands on thighs 2MWT: 270 feet with SPC Stairs: alternating pattern with UE support bilaterally   TODAY'S TREATMENT: 08/27/21 Nu step Level 3 5 minutes Reassessment   08/13/21: 2MWT 221f Nustep L3 x 5 min  Sidestep with RTB around thigh in // bars  no HHA 5RT 2# on ankles Toe tapping 6in step, required 1 HHA Step up 6in 15x BLE required 2 HHA Vector stance 3 x 5" HHA Tandem stance 3x 30" Pallof 20x NBOS RTB    08/06/21 Sidestep in // bars  no HHA 4RT 2# on ankles Marching 2x 15 reps with 2#  Squat at // bars with chair behind 3x 10 HHA Tandem stance 3x 30" intermittent HHA required with Rt LE weight bearing Forward step up 6in 15x each with 2 HHA   Step taps on 6 inch step with 2lb weights x 30 HHA x 1   2MWT 222fno AD, 1'40" Weighted  prostetic limb 3.8#  07/30/21 Nu step lv 3 (seat 9) 6 min for strength and ROM   Standing: Standing march with 2lb weights 3 x10 Standing hip abduction with 2lb weight 3 x 10 Standing hip extension with 2lb weight3 x 10  Mini squat at counter with 2lb weight3 x 10 Standing knee flexion with 2lb weight3 x 10 Step taps on 6 inch step with 2lb weights x 30 HHA x 1  Step ups on 4 inch box x15 each  Sidestepping in // bars 3RT  Seated:   Seated LAQ with 2 lb weights 3 x 10  Walking:   Gait in clinic 1 RT no AD, SBA    07/24/21 Goal review  5 x STS Seated LAQ 10 x 3" Standing hip abduction 3 x 10 Standing hip extension 3 x 10  Mini squat at counter 3 x 10 Standing knee flexion 3 x 10 Step taps on 6 inch step x 30  Nu step 5 min EOS for ROM and LE strength lv 3 (seat 9)  07/16/21 Standing hip abduction 2x 10 bilateral      PATIENT EDUCATION:  Education details: Patient educated on exam findings, POC, scope of PT, HEP, and skin checks. Person educated: Patient Education method: Explanation, Demonstration, and Handouts Education comprehension: verbalized understanding, returned demonstration, verbal cues required, and tactile cues required   HOME EXERCISE PROGRAM:  5/31/23Access Code: [email or web address] - Standing Hip Abduction with Counter Support  - 3 x daily - 7 x weekly - 2 sets - 10 reps  07/24/21 Hip extension Mini squat Standing knee flexion  ASSESSMENT:   CLINICAL IMPRESSION: Patient has met 2/2 short term goals and 3/4 long term  goals with ability to complete HEP and improvement in symptoms, strength, activity tolerance, gait, and functional mobility. Patient with good progress toward remaninig goal with decreased time in 5xSTS but strength/power deficits limit. Patient pleased with current functional level and is ready to d/c. Patient discharged from PT at this time.       OBJECTIVE IMPAIRMENTS Abnormal gait, decreased activity tolerance, decreased balance, decreased  knowledge of use of DME, decreased mobility, difficulty walking, decreased ROM, decreased strength, increased edema, impaired flexibility, and improper body mechanics.    ACTIVITY LIMITATIONS carrying, lifting, bending, standing, squatting, stairs, transfers, locomotion level, and caring for others   PARTICIPATION LIMITATIONS: meal prep, cleaning, laundry, driving, community activity, and yard work   [place], Past/current experiences, Time since onset of injury/illness/exacerbation, and 3+ comorbidities: CAD, metabolic syndrome, Hx MI, hx toe ambutations, RLE bypass graft, CKD  are also affecting patient's functional outcome.    REHAB POTENTIAL: Good   CLINICAL DECISION MAKING: Stable/uncomplicated   EVALUATION COMPLEXITY: Low     GOALS: Goals reviewed with patient? Yes   SHORT TERM GOALS: Target date: 08/06/2021   Patient will be independent with HEP in order to improve functional outcomes. Baseline:  Goal status: MET   2.  Patient will report at least 25% improvement in symptoms for improved quality of life. Baseline:  Goal status: MET       LONG TERM GOALS: Target date: 08/27/2021   Patient will report at least 75% improvement in symptoms for improved quality of life. Baseline:  Goal status:MET   2.  Patient will be able to complete 5x STS in under 11.4 seconds in order to reduce the risk of falls. Baseline:  Goal status: progress toward   3.  Patient will be able to ambulate at least 226 feet in 2MWT with LRAD in order to demonstrate improved gait speed for community ambulation.  Baseline: 08/06/21: 22f no AD in 1'40" prior fatigue. 7/12 270 feet with SPC Goal status: Achieved   4.  Patient will be able to navigate stairs with reciprocal pattern with minimal compensation in order to demonstrate improved LE strength. Baseline: see above Goal status: MET         PLAN: PT FREQUENCY: 1-2x/week   PT DURATION: 6 weeks   PLANNED INTERVENTIONS:  Therapeutic exercises, Therapeutic activity, Neuromuscular re-education, Balance training, Gait training, Patient/Family education, Joint manipulation, Joint mobilization, Stair training, Orthotic/Fit training, DME instructions, Aquatic Therapy, Dry Needling, Electrical stimulation, Spinal manipulation, Spinal mobilization, Cryotherapy, Moist heat, Compression bandaging, scar mobilization, Splintting, Taping, Traction, Ultrasound, Ionotophoresis 459mml Dexamethasone, and Manual therapy   PLAN FOR NEXT SESSION: Gait training with LRAD, core and hip strength training, balance training   2:41 PM, 08/27/21 AnMearl LatinT, DPT Physical Therapist at [facility]

## 2021-11-13 DIAGNOSIS — R69 Illness, unspecified: Secondary | ICD-10-CM | POA: Diagnosis not present

## 2021-11-13 DIAGNOSIS — I10 Essential (primary) hypertension: Secondary | ICD-10-CM | POA: Diagnosis not present

## 2021-11-13 DIAGNOSIS — N393 Stress incontinence (female) (male): Secondary | ICD-10-CM | POA: Diagnosis not present

## 2021-11-13 DIAGNOSIS — S88111A Complete traumatic amputation at level between knee and ankle, right lower leg, initial encounter: Secondary | ICD-10-CM | POA: Diagnosis not present

## 2021-11-13 DIAGNOSIS — I739 Peripheral vascular disease, unspecified: Secondary | ICD-10-CM | POA: Diagnosis not present

## 2021-11-13 DIAGNOSIS — Z6835 Body mass index (BMI) 35.0-35.9, adult: Secondary | ICD-10-CM | POA: Diagnosis not present

## 2021-11-13 DIAGNOSIS — F17210 Nicotine dependence, cigarettes, uncomplicated: Secondary | ICD-10-CM | POA: Diagnosis not present

## 2021-11-13 DIAGNOSIS — E1122 Type 2 diabetes mellitus with diabetic chronic kidney disease: Secondary | ICD-10-CM | POA: Diagnosis not present

## 2021-11-13 DIAGNOSIS — D638 Anemia in other chronic diseases classified elsewhere: Secondary | ICD-10-CM | POA: Diagnosis not present

## 2021-11-13 DIAGNOSIS — E7849 Other hyperlipidemia: Secondary | ICD-10-CM | POA: Diagnosis not present

## 2021-11-13 DIAGNOSIS — R809 Proteinuria, unspecified: Secondary | ICD-10-CM | POA: Diagnosis not present

## 2022-05-29 ENCOUNTER — Other Ambulatory Visit: Payer: Self-pay | Admitting: *Deleted

## 2022-05-29 DIAGNOSIS — E781 Pure hyperglyceridemia: Secondary | ICD-10-CM | POA: Diagnosis not present

## 2022-05-29 DIAGNOSIS — N1831 Chronic kidney disease, stage 3a: Secondary | ICD-10-CM | POA: Diagnosis not present

## 2022-05-29 DIAGNOSIS — E1122 Type 2 diabetes mellitus with diabetic chronic kidney disease: Secondary | ICD-10-CM | POA: Diagnosis not present

## 2022-05-29 DIAGNOSIS — I739 Peripheral vascular disease, unspecified: Secondary | ICD-10-CM

## 2022-05-29 DIAGNOSIS — E1165 Type 2 diabetes mellitus with hyperglycemia: Secondary | ICD-10-CM | POA: Diagnosis not present

## 2022-05-29 DIAGNOSIS — E7849 Other hyperlipidemia: Secondary | ICD-10-CM | POA: Diagnosis not present

## 2022-06-02 DIAGNOSIS — Z1389 Encounter for screening for other disorder: Secondary | ICD-10-CM | POA: Diagnosis not present

## 2022-06-02 DIAGNOSIS — S88111A Complete traumatic amputation at level between knee and ankle, right lower leg, initial encounter: Secondary | ICD-10-CM | POA: Diagnosis not present

## 2022-06-02 DIAGNOSIS — D638 Anemia in other chronic diseases classified elsewhere: Secondary | ICD-10-CM | POA: Diagnosis not present

## 2022-06-02 DIAGNOSIS — N1831 Chronic kidney disease, stage 3a: Secondary | ICD-10-CM | POA: Diagnosis not present

## 2022-06-02 DIAGNOSIS — I739 Peripheral vascular disease, unspecified: Secondary | ICD-10-CM | POA: Diagnosis not present

## 2022-06-02 DIAGNOSIS — E782 Mixed hyperlipidemia: Secondary | ICD-10-CM | POA: Diagnosis not present

## 2022-06-02 DIAGNOSIS — I10 Essential (primary) hypertension: Secondary | ICD-10-CM | POA: Diagnosis not present

## 2022-06-02 DIAGNOSIS — R809 Proteinuria, unspecified: Secondary | ICD-10-CM | POA: Diagnosis not present

## 2022-06-02 DIAGNOSIS — E1122 Type 2 diabetes mellitus with diabetic chronic kidney disease: Secondary | ICD-10-CM | POA: Diagnosis not present

## 2022-06-02 DIAGNOSIS — E7849 Other hyperlipidemia: Secondary | ICD-10-CM | POA: Diagnosis not present

## 2022-06-09 ENCOUNTER — Ambulatory Visit: Payer: Medicare Other | Admitting: Physician Assistant

## 2022-06-09 ENCOUNTER — Ambulatory Visit (HOSPITAL_COMMUNITY)
Admission: RE | Admit: 2022-06-09 | Discharge: 2022-06-09 | Disposition: A | Discharge status: Home / Self Care | Payer: Medicare Other | Source: Ambulatory Visit | Attending: Vascular Surgery | Admitting: Vascular Surgery

## 2022-06-09 VITALS — BP 165/77 | HR 65 | Temp 97.6°F | Resp 20 | Ht 65.0 in | Wt 224.5 lb

## 2022-06-09 DIAGNOSIS — I739 Peripheral vascular disease, unspecified: Secondary | ICD-10-CM

## 2022-06-09 LAB — VAS US ABI WITH/WO TBI: Left ABI: 0.67

## 2022-06-09 NOTE — Progress Notes (Addendum)
[facility] [place] HISTORY AND PHYSICAL   History of Present Illness:   62 y/o female with hx of Type II DM, HTN, CKD III, tobacco abuse, charcot foot, and peripheral artery disease with history of RLE bypass and 4th and 5th toe amputations.  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 Lenell Antu for right foot ischemic changes that was not salvagable.  She was last seen 04/16/21 at that time the right BKA was well healed.  She also had left LE palpable DP and brisk doppler flow in the PT.    She is ambulatory and independent with a right BKA prosthetic.  She denies rest pain, non healing wounds or short distance claudication on the left LE.  She continues to smoke tobacco.    She is medically managed on ASA and Statin daily.        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   Arthritis    Chronic kidney disease    recent UTI   Coronary artery disease    Depression Jul 13, 2010   Following sudden death of husband   Diabetes mellitus, type 2    Hypertension    Kidney cysts    Metabolic syndrome    Elevated triglycerides, low HDL, fasting hyperglycemia with low-dose sulfonylurea   Myocardial infarction 09/2010   PONV (postoperative nausea and vomiting)    Tobacco abuse    60-90-pack-year history; 1/3 pack per day in 2011/07/13    Past Surgical History:  Procedure Laterality Date   ABDOMINAL AORTAGRAM N/A 10/09/2013   Procedure: ABDOMINAL Ronny Flurry;  Surgeon: Chuck Hint, MD;  Location: [facility] CATH LAB;  Service: Cardiovascular;  Laterality: N/A;   AMPUTATION Right 10/19/2013   Procedure: AMPUTATION DIGIT-RIGHT 4TH TOE AMPUTATION;  Surgeon: Chuck Hint, MD;  Location: [facility] OR;  Service: Vascular;  Laterality: Right;   AMPUTATION Right 03/14/2021   Procedure: RIGHT BELOW KNEE AMPUTATION;  Surgeon: Leonie Douglas, MD;  Location: [facility] OR;  Service: Vascular;  Laterality:  Right;   APPLICATION OF WOUND VAC Right 11/24/2012   Procedure: APPLICATION OF WOUND VAC;  Surgeon: Dallas Schimke, DPM;  [facility];  Service: Orthopedics;  Laterality: Right;   BILATERAL OOPHORECTOMY  12-Jul-2009   for benign mass   BREAST SURGERY Left    brest biopsy   CHOLECYSTECTOMY     CORONARY ANGIOPLASTY WITH STENT PLACEMENT  10/01/10   DES x2   FEMORAL-POPLITEAL BYPASS GRAFT Right 10/19/2013   Procedure: RIGHT FEMORAL-BELOW KNEE POPLITEAL ARTERY BYPASS GRAFT;  Surgeon: Chuck Hint, MD;  Location: [facility] OR;  Service: Vascular;  Laterality: Right;   INCISION AND DRAINAGE OF WOUND Right 11/24/2012   Procedure: DEBRIDEMENT WOUND FOOT;  Surgeon: Dallas Schimke, DPM;  [facility];  Service: Orthopedics;  Laterality: Right;   INCISION AND DRAINAGE OF WOUND Right 03/23/2013   Procedure: DEBRIDEMENT OF INFECTED BONE 5TH METATARSAL RIGHT FOOT;  Surgeon: Dallas Schimke, DPM;  [facility];  Service: Orthopedics;  Laterality: Right;   INTRAOPERATIVE ARTERIOGRAM Right 10/19/2013   Procedure: INTRA OPERATIVE ARTERIOGRAM;  Surgeon: Chuck Hint, MD;  Location: [facility] OR;  Service: Vascular;  Laterality: Right;   METATARSAL HEAD EXCISION Right 11/03/2012   Procedure: AMPUTATION OF RIGHT FIFTH TOE AND PORTION OF FIFTH METATARSAL;  Surgeon: Dallas Schimke, DPM;  [facility];  Service: Orthopedics;  Laterality: Right;  MINOR APPLICATION OF WOUND VAC Right 03/14/2021   Procedure: MINOR APPLICATION OF WOUND VAC;  Surgeon: Leonie Douglas, MD;  Location: [facility] OR;  Service: Vascular;  Laterality: Right;   TOE AMPUTATION      ROS:   General:  No weight loss, Fever, chills  HEENT: No recent headaches, no nasal bleeding, no visual changes, no sore throat  Neurologic: No dizziness, blackouts, seizures. No recent symptoms of stroke or mini- stroke. No recent episodes of slurred speech, or temporary blindness.  Cardiac: No recent episodes of chest  pain/pressure, no shortness of breath at rest.  No shortness of breath with exertion.  Denies history of atrial fibrillation or irregular heartbeat  Vascular: No history of rest pain in feet.  No history of claudication.  No history of non-healing ulcer, No history of DVT   Pulmonary: No home oxygen, no productive cough, no hemoptysis,  No asthma or wheezing  Musculoskeletal:   Arthritis,  Low back pain,   Joint pain  Hematologic:No history of hypercoagulable state.  No history of easy bleeding.  No history of anemia  Gastrointestinal: No hematochezia or melena,  No gastroesophageal reflux, no trouble swallowing  Urinary:  chronic Kidney disease,  on HD -  MWF or  TTHS,  Burning with urination,  Frequent urination,  Difficulty urinating;   Skin: No rashes  Psychological: No history of anxiety,  No history of depression  Social History Social History   Tobacco Use   Smoking status: Some Days    Packs/day: 1.00    Years: 30.00    Additional pack years: 0.00    Total pack years: 30.00    Types: Cigarettes    Start date: 10/18/1983    Passive exposure: Never   Smokeless tobacco: Never  Vaping Use   Vaping Use: Never used  Substance Use Topics   Alcohol use: No   Drug use: No    Family History Family History  Problem Relation Age of Onset   Heart disease Father    Hyperlipidemia Father    Hypertension Father    Heart disease Mother    Diabetes Mother    Hyperlipidemia Mother    Hypertension Mother    Glaucoma Mother    Varicose Veins Sister    Cancer Brother    Cancer Maternal Grandfather    Diabetes Maternal Grandmother    Heart disease Maternal Grandmother    Hypertension Maternal Grandmother    COPD Paternal Grandfather    Hyperlipidemia Sister     Allergies  Allergies  Allergen Reactions   Alpha-Gal Anaphylaxis    RED MEAT ALLERGY   Alpha-D-Galactosidase    Bactrim Diarrhea   Isosorbide Mononitrate Other (See Comments)     Made patient feel as though she was about to have another MI   Keflex [Cephalexin] Other (See Comments)    Yeast infection     Current Outpatient Medications  Medication Sig Dispense Refill   amoxicillin-clavulanate (AUGMENTIN) 875-125 MG tablet Take 1 tablet by mouth 2 (two) times daily. (Patient not taking: Reported on 06/09/2022)     aspirin EC 81 MG tablet Take 81 mg by mouth daily.     atorvastatin (LIPITOR) 20 MG tablet Take 20 mg by mouth every evening.     citalopram (CELEXA) 20 MG tablet Take 20 mg by mouth daily.     fish oil-omega-3 fatty acids 1000 MG capsule Take 3 g by mouth daily.  glipiZIDE (GLUCOTROL) 5 MG tablet Take 10 mg by mouth 2 (two) times daily before a meal.     lisinopril (ZESTRIL) 10 MG tablet Take 10 mg by mouth daily.     metFORMIN (GLUCOPHAGE) 500 MG tablet Take 500 mg by mouth 2 (two) times daily with a meal.     metoprolol tartrate (LOPRESSOR) 25 MG tablet Take 1 tablet (25 mg total) by mouth 2 (two) times daily. 180 tablet 3   niacin 500 MG CR capsule Take 1 capsule (500 mg total) by mouth at bedtime. 30 capsule 6   nitroGLYCERIN (NITROSTAT) 0.4 MG SL tablet Place 1 tablet (0.4 mg total) under the tongue every 5 (five) minutes as needed. 25 tablet 5   oxyCODONE (ROXICODONE) 5 MG immediate release tablet Take 1 tablet (5 mg total) by mouth every 6 (six) hours as needed for severe pain. (Patient not taking: Reported on 06/09/2022) 15 tablet 0   Vitamin D, Cholecalciferol, 25 MCG (1000 UT) TABS Take 1,000 Units by mouth daily. 30 tablet 0   No current facility-administered medications for this visit.    Physical Examination  Vitals:   06/09/22 1054  BP: (!) 165/77  Pulse: 65  Resp: 20  Temp: 97.6 F (36.4 C)  TempSrc: Temporal  SpO2: 93%  Weight: 224 lb 8 oz (101.8 kg)  Height: 5\' 5"  (1.651 m)    Body mass index is 37.36 kg/m.  General:  Alert and oriented, no acute distress HEENT: Normal Neck: right carotid  bruit  Pulmonary: Clear to  auscultation bilaterally Cardiac: Regular Rate and Rhythm without murmur Abdomen: Soft, non-tender, non-distended, no mass, no scars Skin: No rash Extremity Pulses:   radial,  femorals palpable.  No ischemic skin changes left LE  Musculoskeletal: No deformity or edema  Neurologic: Upper and lower extremity motor grossly intact  and symmetric.  Right BKA  DATA:  ABI Findings:  +--------+------------------+-----+--------+--------+  Right  Rt Pressure (mmHg)IndexWaveformComment   +--------+------------------+-----+--------+--------+  [id number]                                     +--------+------------------+-----+--------+--------+   +---------+------------------+-----+----------+-------+  Left    Lt Pressure (mmHg)IndexWaveform  Comment  +---------+------------------+-----+----------+-------+  Brachial 171                                       +---------+------------------+-----+----------+-------+  PTA     109               0.64 monophasic         +---------+------------------+-----+----------+-------+  DP      91                0.53 monophasic         +---------+------------------+-----+----------+-------+  Great Toe63                0.37                    +---------+------------------+-----+----------+-------+   +-------+-----------+-----------+------------+------------+  ABI/TBIToday's ABIToday's TBIPrevious ABIPrevious TBI  +-------+-----------+-----------+------------+------------+  Right                       0.72                      +-------+-----------+-----------+------------+------------+  Left  0.67  0.37       0.5                       +-------+-----------+-----------+------------+------------+        Previous ABI by radiology at [facility] on 03/10/21.    Summary:  Left: Resting left ankle-brachial index indicates moderate left lower  extremity arterial disease. The left toe-brachial index is  abnormal.   ASSESSMENT/PLAN:   PAD with history of right LE BKA without further vascular options. A year ago.  She is asymptomatic for left LE claudication, rest pain or non healing wounds.   She is ambulatory with a right prosthetic.  She is very good at checking her skin and taking good care of the left LE.    The ABI is stable with slight increase in the left from 0.5 to 0.67.   I did hear a bruit on the right Carotid on exam today.  She has the history pf PAD and history of CAD.  She states recently she has had a dizzy spell that last 2 days.  I will bring her back for carotid duplex within the next 6 months and 1 year for repeat ABI's.    If she develops rest pain, non healing wounds or clau dication at short distances on the left LE she will call or return sooner.        Mosetta Pigeon PA-C [facility] [place] Office: [phone]  MD in clinic [place]

## 2022-06-17 ENCOUNTER — Other Ambulatory Visit: Payer: Self-pay

## 2022-06-17 DIAGNOSIS — R0989 Other specified symptoms and signs involving the circulatory and respiratory systems: Secondary | ICD-10-CM

## 2022-06-22 DIAGNOSIS — R809 Proteinuria, unspecified: Secondary | ICD-10-CM | POA: Diagnosis not present

## 2022-06-22 DIAGNOSIS — N1831 Chronic kidney disease, stage 3a: Secondary | ICD-10-CM | POA: Diagnosis not present

## 2022-06-22 DIAGNOSIS — I10 Essential (primary) hypertension: Secondary | ICD-10-CM | POA: Diagnosis not present

## 2022-06-22 DIAGNOSIS — E1122 Type 2 diabetes mellitus with diabetic chronic kidney disease: Secondary | ICD-10-CM | POA: Diagnosis not present

## 2022-06-22 DIAGNOSIS — N2581 Secondary hyperparathyroidism of renal origin: Secondary | ICD-10-CM | POA: Diagnosis not present

## 2022-07-06 DIAGNOSIS — E1122 Type 2 diabetes mellitus with diabetic chronic kidney disease: Secondary | ICD-10-CM | POA: Diagnosis not present

## 2022-07-06 DIAGNOSIS — N1831 Chronic kidney disease, stage 3a: Secondary | ICD-10-CM | POA: Diagnosis not present

## 2022-08-15 IMAGING — US US RENAL
1 series · 14 of 25 positions shown · non-contrast
Comparison: Renal ultrasound July 26, 2019

CLINICAL DATA: Chronic renal disease.

EXAM:
RENAL / URINARY TRACT ULTRASOUND COMPLETE

[Series 1: us renal · 39 acquisitions, 14 frames shown]
[im 1/39]
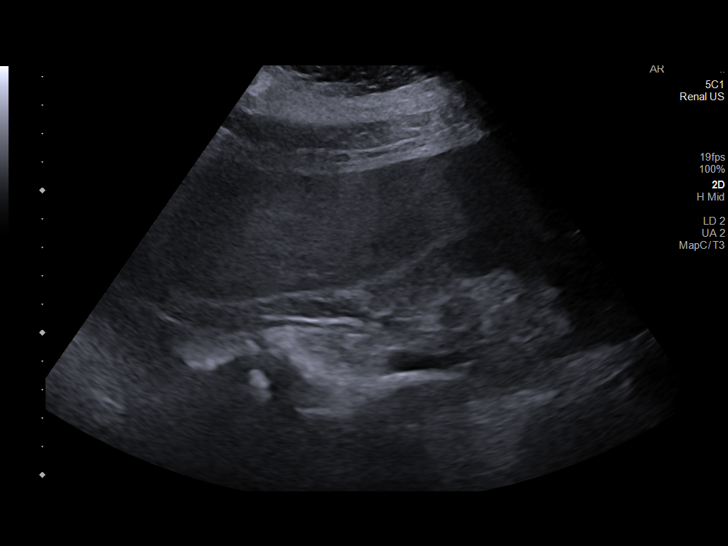
[im 4/39]
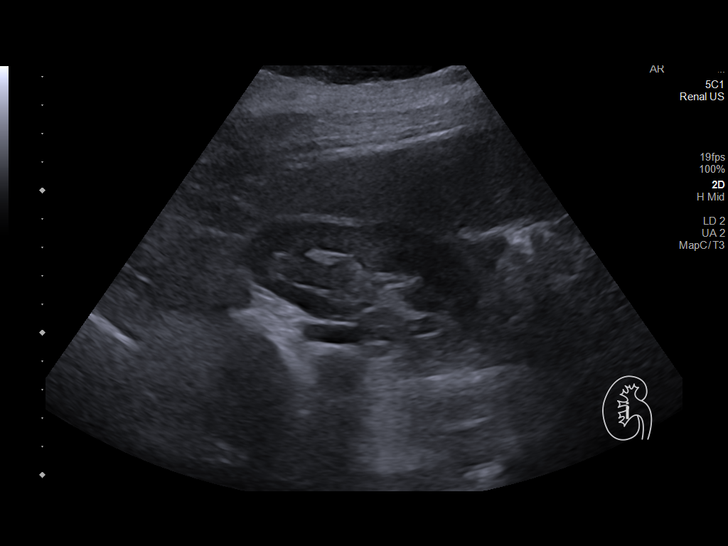
[im 7/39]
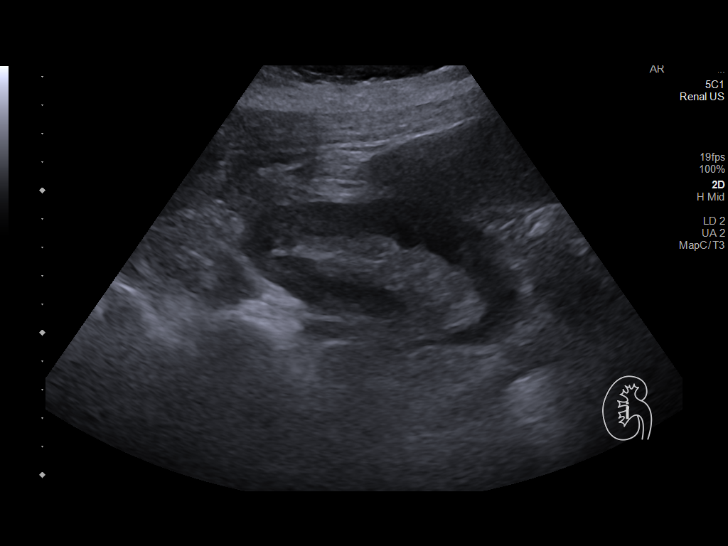
[im 10/39]
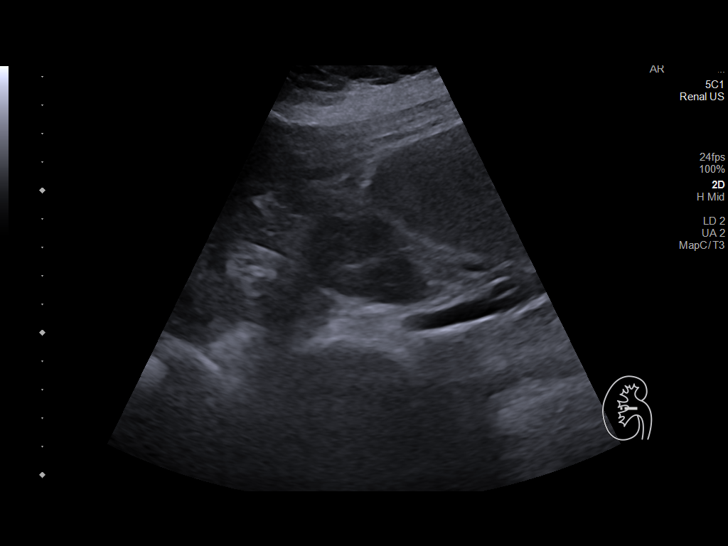
[im 13/39]
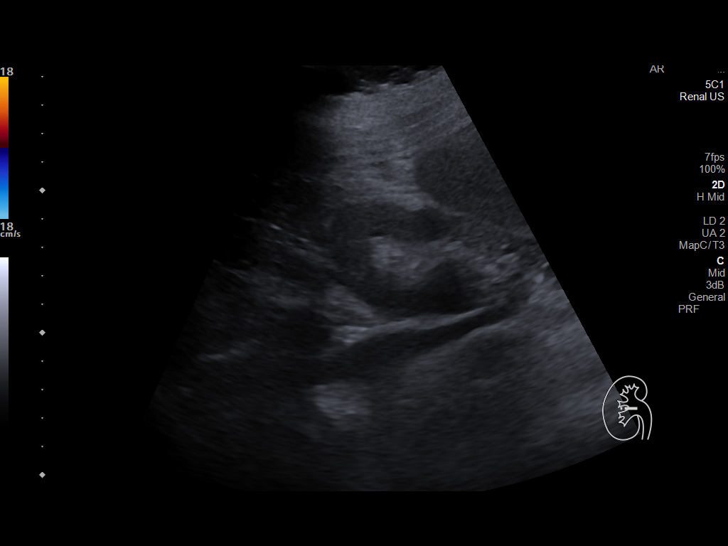
[im 15/39]
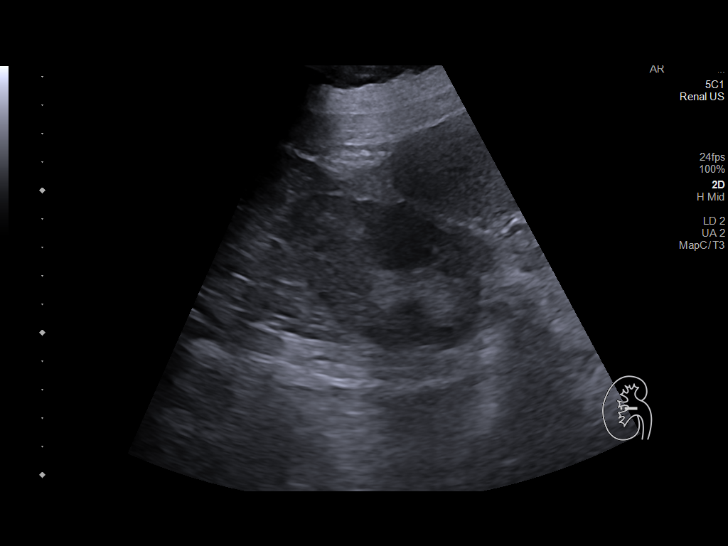
[im 18/39]
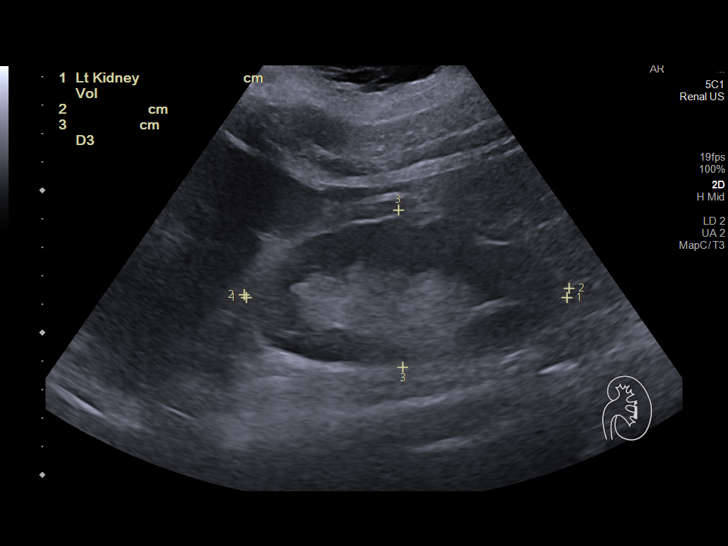
[im 21/39]
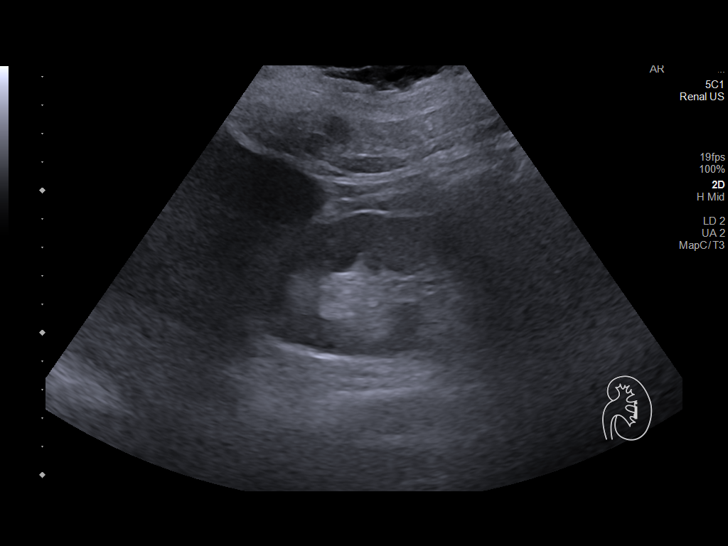
[im 24/39]
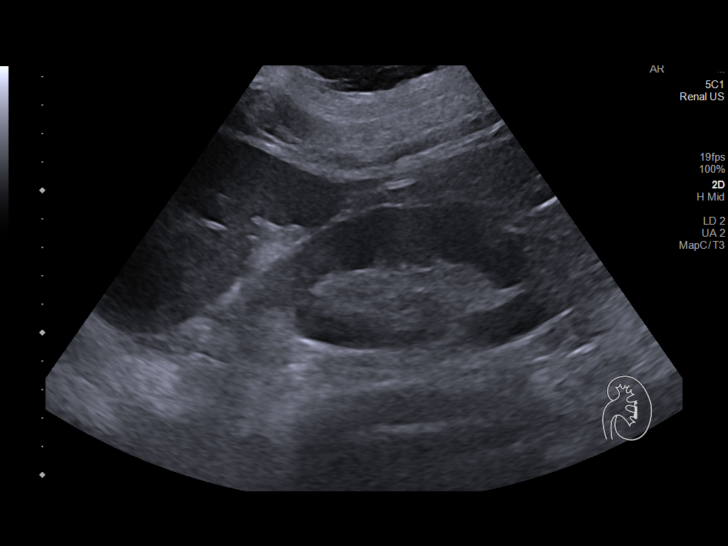
[im 26/39]
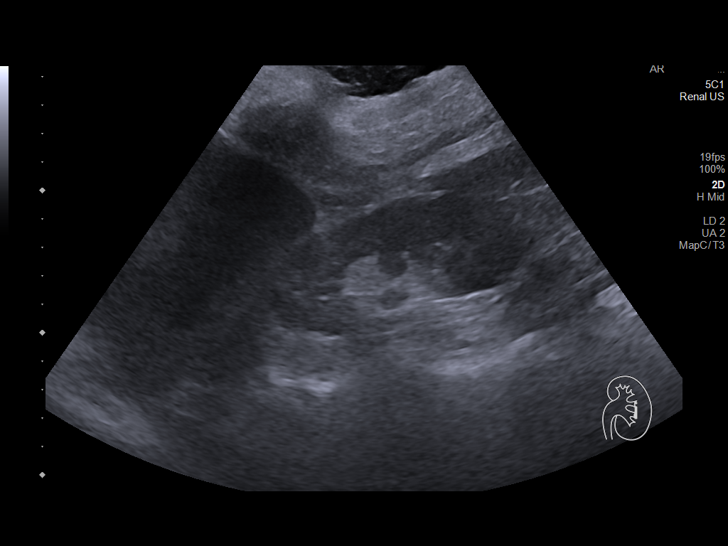
[im 29/39]
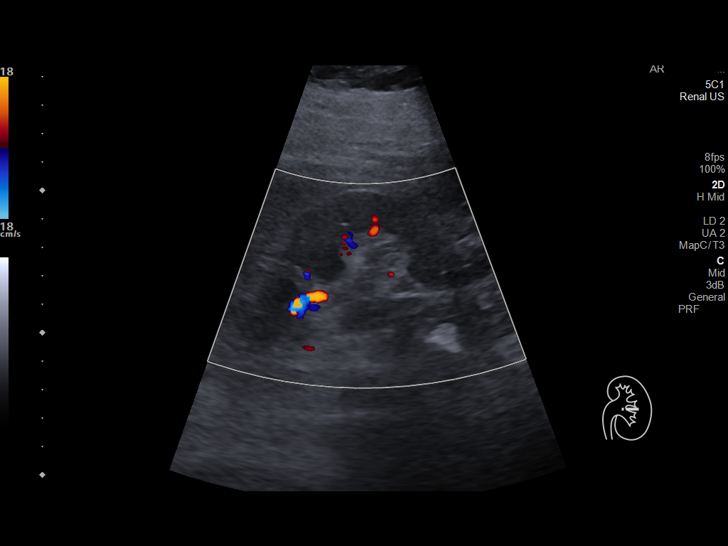
[im 32/39]
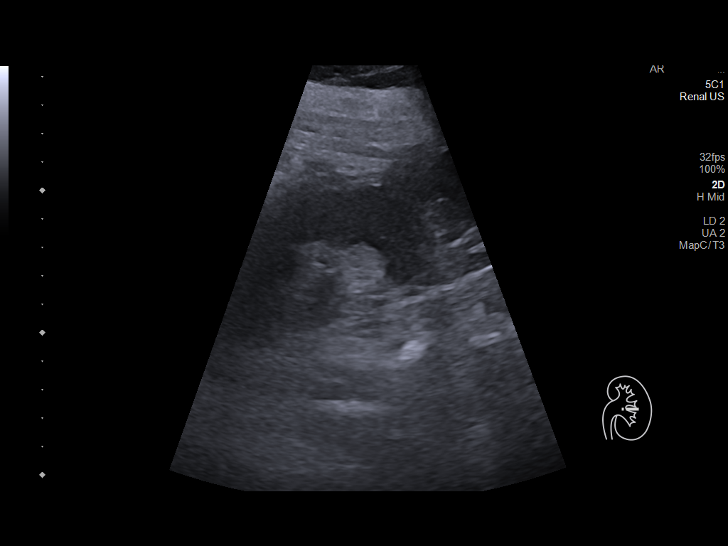
[im 35/39]
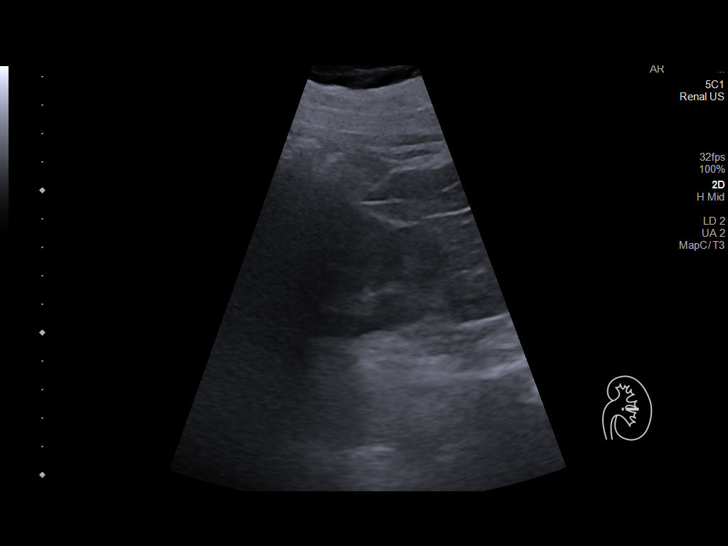
[im 39/39]
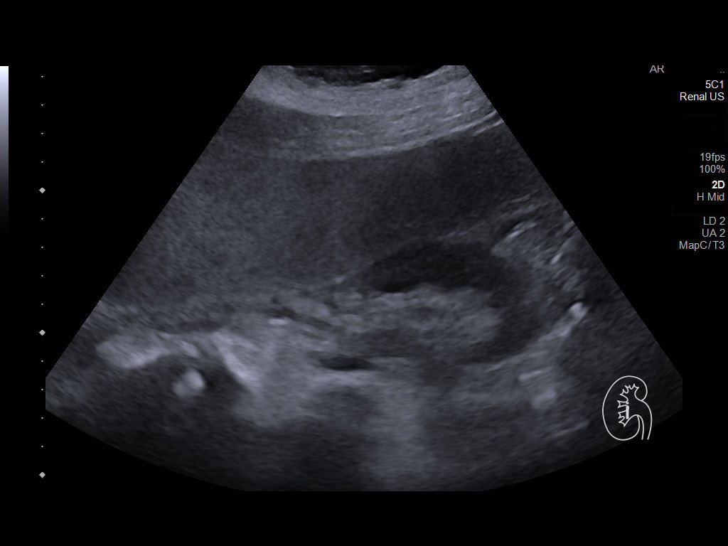

[14 of 25 positions shown; findings below may reference images not displayed]

FINDINGS: Right Kidney:

Renal measurements: 11.0 x 5.1 x 5.5 cm = volume: 163 mL.
Echogenicity within normal limits. No mass or hydronephrosis
visualized.

Left Kidney:

Renal measurements: 11.4 x 5.5 x 5.5 cm = volume: 180.2 mL.
Echogenicity within normal limits. No mass or hydronephrosis
visualized.

Bladder:

Not distended precluding evaluation.

Other:

None.
IMPRESSION: 1. The bladder cannot be evaluated due to lack of distention.
2. The kidneys are unremarkable.

## 2022-08-25 DIAGNOSIS — E781 Pure hyperglyceridemia: Secondary | ICD-10-CM | POA: Diagnosis not present

## 2022-08-25 DIAGNOSIS — I10 Essential (primary) hypertension: Secondary | ICD-10-CM | POA: Diagnosis not present

## 2022-08-25 DIAGNOSIS — E875 Hyperkalemia: Secondary | ICD-10-CM | POA: Diagnosis not present

## 2022-08-25 DIAGNOSIS — E1165 Type 2 diabetes mellitus with hyperglycemia: Secondary | ICD-10-CM | POA: Diagnosis not present

## 2022-08-25 DIAGNOSIS — E7849 Other hyperlipidemia: Secondary | ICD-10-CM | POA: Diagnosis not present

## 2022-08-31 DIAGNOSIS — E7849 Other hyperlipidemia: Secondary | ICD-10-CM | POA: Diagnosis not present

## 2022-08-31 DIAGNOSIS — I10 Essential (primary) hypertension: Secondary | ICD-10-CM | POA: Diagnosis not present

## 2022-08-31 DIAGNOSIS — I739 Peripheral vascular disease, unspecified: Secondary | ICD-10-CM | POA: Diagnosis not present

## 2022-08-31 DIAGNOSIS — S88111A Complete traumatic amputation at level between knee and ankle, right lower leg, initial encounter: Secondary | ICD-10-CM | POA: Diagnosis not present

## 2022-08-31 DIAGNOSIS — F17210 Nicotine dependence, cigarettes, uncomplicated: Secondary | ICD-10-CM | POA: Diagnosis not present

## 2022-08-31 DIAGNOSIS — D638 Anemia in other chronic diseases classified elsewhere: Secondary | ICD-10-CM | POA: Diagnosis not present

## 2022-08-31 DIAGNOSIS — N1831 Chronic kidney disease, stage 3a: Secondary | ICD-10-CM | POA: Diagnosis not present

## 2022-08-31 DIAGNOSIS — E1122 Type 2 diabetes mellitus with diabetic chronic kidney disease: Secondary | ICD-10-CM | POA: Diagnosis not present

## 2022-08-31 DIAGNOSIS — R809 Proteinuria, unspecified: Secondary | ICD-10-CM | POA: Diagnosis not present

## 2022-11-03 DIAGNOSIS — I10 Essential (primary) hypertension: Secondary | ICD-10-CM | POA: Diagnosis not present

## 2022-11-03 DIAGNOSIS — N1831 Chronic kidney disease, stage 3a: Secondary | ICD-10-CM | POA: Diagnosis not present

## 2022-11-03 DIAGNOSIS — R809 Proteinuria, unspecified: Secondary | ICD-10-CM | POA: Diagnosis not present

## 2022-11-03 DIAGNOSIS — N2581 Secondary hyperparathyroidism of renal origin: Secondary | ICD-10-CM | POA: Diagnosis not present

## 2022-11-03 DIAGNOSIS — E1122 Type 2 diabetes mellitus with diabetic chronic kidney disease: Secondary | ICD-10-CM | POA: Diagnosis not present

## 2022-12-08 ENCOUNTER — Ambulatory Visit (HOSPITAL_COMMUNITY)
Admission: RE | Admit: 2022-12-08 | Discharge: 2022-12-08 | Disposition: A | Discharge status: Home / Self Care | Payer: Medicare Other | Source: Ambulatory Visit | Attending: Vascular Surgery | Admitting: Vascular Surgery

## 2022-12-08 ENCOUNTER — Ambulatory Visit: Payer: Medicare Other | Admitting: Physician Assistant

## 2022-12-08 ENCOUNTER — Encounter: Payer: Self-pay | Admitting: Physician Assistant

## 2022-12-08 VITALS — BP 148/68 | HR 86 | Temp 97.3°F | Resp 20 | Ht 65.0 in | Wt 221.3 lb

## 2022-12-08 DIAGNOSIS — R0989 Other specified symptoms and signs involving the circulatory and respiratory systems: Secondary | ICD-10-CM | POA: Diagnosis not present

## 2022-12-08 NOTE — Progress Notes (Signed)
History of Present Illness:   62 y/o female with hx of Type II DM, HTN, CKD III, tobacco abuse, charcot foot, and peripheral artery disease with history of RLE bypass and 4th and 5th toe amputations.  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 Lenell Antu for right foot ischemic changes that was not salvagable.  She was last seen 04/16/21 at that time the right BKA was well healed.  She also had left LE palpable DP and brisk doppler flow in the PT.               She is ambulatory and independent with a right BKA prosthetic.  She denies rest pain, non healing wounds or short distance claudication on the left LE.  She continues to smoke tobacco.  On her last visit a right carotid bruit was heard.  She is here for carotid duplex follow up.  She denies stroke/TIA symptoms no weakness, aphasia or facial droop.               She is medically managed on ASA and Statin daily.     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   Arthritis    Chronic kidney disease    recent UTI   Coronary artery disease    Depression 12-24-10   Following sudden death of husband   Diabetes mellitus, type 2 (HCC)    Hypertension    Kidney cysts    Metabolic syndrome    Elevated triglycerides, low HDL, fasting hyperglycemia with low-dose sulfonylurea   Myocardial infarction (HCC) 09/2010   PONV (postoperative nausea and vomiting)    Tobacco abuse    60-90-pack-year history; 1/3 pack per day in 2011/12/24    Past Surgical History:  Procedure Laterality Date   ABDOMINAL AORTAGRAM N/A 10/09/2013   Procedure: ABDOMINAL Ronny Flurry;  Surgeon: Chuck Hint, MD;  Location: [facility] CATH LAB;  Service: Cardiovascular;  Laterality: N/A;   AMPUTATION Right 10/19/2013   Procedure: AMPUTATION DIGIT-RIGHT 4TH TOE AMPUTATION;  Surgeon: Chuck Hint, MD;  Location: [facility] OR;  Service: Vascular;  Laterality: Right;   AMPUTATION  Right 03/14/2021   Procedure: RIGHT BELOW KNEE AMPUTATION;  Surgeon: Leonie Douglas, MD;  Location: [facility] OR;  Service: Vascular;  Laterality: Right;   APPLICATION OF WOUND VAC Right 11/24/2012   Procedure: APPLICATION OF WOUND VAC;  Surgeon: Dallas Schimke, DPM;  Location: [facility] ORS;  Service: Orthopedics;  Laterality: Right;   BILATERAL OOPHORECTOMY  23-Dec-2009   for benign mass   BREAST SURGERY Left    brest biopsy   CHOLECYSTECTOMY     CORONARY ANGIOPLASTY WITH STENT PLACEMENT  10/01/10   DES x2   FEMORAL-POPLITEAL BYPASS GRAFT Right 10/19/2013   Procedure: RIGHT FEMORAL-BELOW KNEE POPLITEAL ARTERY BYPASS GRAFT;  Surgeon: Chuck Hint, MD;  Location: [facility] OR;  Service: Vascular;  Laterality: Right;   INCISION AND DRAINAGE OF WOUND Right 11/24/2012   Procedure: DEBRIDEMENT WOUND FOOT;  Surgeon: Dallas Schimke, DPM;  Location: [facility] ORS;  Service: Orthopedics;  Laterality: Right;   INCISION AND DRAINAGE OF WOUND Right 03/23/2013   Procedure: DEBRIDEMENT OF INFECTED BONE 5TH METATARSAL RIGHT FOOT;  Surgeon: Dallas Schimke, DPM;  Location: [facility] ORS;  Service: Orthopedics;  Laterality: Right;   INTRAOPERATIVE ARTERIOGRAM Right 10/19/2013   Procedure: INTRA OPERATIVE ARTERIOGRAM;  Surgeon: Chuck Hint, MD;  Location: [facility] OR;  Service: Vascular;  Laterality: Right;   METATARSAL HEAD EXCISION Right 11/03/2012   Procedure: AMPUTATION OF RIGHT FIFTH TOE AND PORTION OF FIFTH METATARSAL;  Surgeon: Dallas Schimke, DPM;  Location: [facility] ORS;  Service: Orthopedics;  Laterality: Right;   MINOR APPLICATION OF WOUND VAC Right 03/14/2021   Procedure: MINOR APPLICATION OF WOUND VAC;  Surgeon: Leonie Douglas, MD;  Location: [facility] OR;  Service: Vascular;  Laterality: Right;   TOE AMPUTATION       Social History Social History   Tobacco Use   Smoking status: Some Days    Current packs/day: 1.00    Average packs/day: 1 pack/day for 39.1 years (39.1 ttl pk-yrs)    Types: Cigarettes     Start date: 10/18/1983    Passive exposure: Never   Smokeless tobacco: Never  Vaping Use   Vaping status: Never Used  Substance Use Topics   Alcohol use: No   Drug use: No    Family History Family History  Problem Relation Age of Onset   Heart disease Father    Hyperlipidemia Father    Hypertension Father    Heart disease Mother    Diabetes Mother    Hyperlipidemia Mother    Hypertension Mother    Glaucoma Mother    Varicose Veins Sister    Cancer Brother    Cancer Maternal Grandfather    Diabetes Maternal Grandmother    Heart disease Maternal Grandmother    Hypertension Maternal Grandmother    COPD Paternal Grandfather    Hyperlipidemia Sister     Allergies  Allergies  Allergen Reactions   Alpha-Gal Anaphylaxis    RED MEAT ALLERGY   Alpha-D-Galactosidase    Bactrim Diarrhea   Isosorbide Mononitrate Other (See Comments)    Made patient feel as though she was about to have another MI   Keflex [Cephalexin] Other (See Comments)    Yeast infection     Current Outpatient Medications  Medication Sig Dispense Refill   amoxicillin-clavulanate (AUGMENTIN) 875-125 MG tablet Take 1 tablet by mouth 2 (two) times daily. (Patient not taking: Reported on 06/09/2022)     aspirin EC 81 MG tablet Take 81 mg by mouth daily.     atorvastatin (LIPITOR) 20 MG tablet Take 20 mg by mouth every evening.     citalopram (CELEXA) 20 MG tablet Take 20 mg by mouth daily.     fish oil-omega-3 fatty acids 1000 MG capsule Take 3 g by mouth daily.      glipiZIDE (GLUCOTROL) 5 MG tablet Take 10 mg by mouth 2 (two) times daily before a meal.     lisinopril (ZESTRIL) 10 MG tablet Take 10 mg by mouth daily.     metFORMIN (GLUCOPHAGE) 500 MG tablet Take 500 mg by mouth 2 (two) times daily with a meal.     metoprolol tartrate (LOPRESSOR) 25 MG tablet Take 1 tablet (25 mg total) by mouth 2 (two) times daily. 180 tablet 3   niacin 500 MG CR capsule Take 1 capsule (500 mg total) by mouth at bedtime. 30  capsule 6   nitroGLYCERIN (NITROSTAT) 0.4 MG SL tablet Place 1 tablet (0.4 mg total) under the tongue every 5 (five) minutes as needed. 25 tablet 5   oxyCODONE (ROXICODONE) 5 MG immediate release tablet Take 1 tablet (5 mg total) by mouth every 6 (six) hours as needed for severe pain. (Patient not taking: Reported on 06/09/2022) 15 tablet 0   Vitamin D, Cholecalciferol, 25 MCG (1000  UT) TABS Take 1,000 Units by mouth daily. 30 tablet 0   No current facility-administered medications for this visit.    ROS:   General:  No weight loss, Fever, chills  HEENT: No recent headaches, no nasal bleeding, no visual changes, no sore throat  Neurologic: No dizziness, blackouts, seizures. No recent symptoms of stroke or mini- stroke. No recent episodes of slurred speech, or temporary blindness.  Cardiac: No recent episodes of chest pain/pressure, no shortness of breath at rest.  No shortness of breath with exertion.  Denies history of atrial fibrillation or irregular heartbeat  Vascular: No history of rest pain in feet.  No history of claudication.  No history of non-healing ulcer, No history of DVT   Pulmonary: No home oxygen, no productive cough, no hemoptysis,  No asthma or wheezing  Musculoskeletal:  [ ]  Arthritis, [ ]  Low back pain,  [ ]  Joint pain  Hematologic:No history of hypercoagulable state.  No history of easy bleeding.  No history of anemia  Gastrointestinal: No hematochezia or melena,  No gastroesophageal reflux, no trouble swallowing  Urinary: [ ]  chronic Kidney disease, [ ]  on HD - [ ]  MWF or [ ]  TTHS, [ ]  Burning with urination, [ ]  Frequent urination, [ ]  Difficulty urinating;   Skin: No rashes  Psychological: No history of anxiety,  No history of depression   Physical Examination  Vitals:   12/08/22 1336  BP: (!) 148/68  Pulse: 86  Resp: 20  Temp: (!) 97.3 F (36.3 C)  TempSrc: Temporal  SpO2: (!) 89%  Weight: 221 lb 4.8 oz (100.4 kg)  Height: 5\' 5"  (1.651 m)     Body mass index is 36.83 kg/m.  General:  Alert and oriented, no acute distress HEENT: Normal Neck: right carotid  bruit  Pulmonary: Clear to auscultation bilaterally Cardiac: Regular Rate and Rhythm without murmur Gastrointestinal: Soft, non-tender, non-distended, no mass, no scars Skin: No rash Radial pulses palpable B UE Musculoskeletal: No deformity or edema, right prothesis   Neurologic: Upper and lower extremity motor and symmetric  DATA:     Right Carotid Findings:  +----------+--------+--------+--------+-------------------------+--------+           PSV cm/sEDV cm/sStenosisPlaque Description       Comments  +----------+--------+--------+--------+-------------------------+--------+  CCA Prox  90      13                                                 +----------+--------+--------+--------+-------------------------+--------+  CCA Mid   73      16                                                 +----------+--------+--------+--------+-------------------------+--------+  CCA Distal61      13              heterogenous and calcific          +----------+--------+--------+--------+-------------------------+--------+  ICA Prox  104     24      1-39%   heterogenous and calcific          +----------+--------+--------+--------+-------------------------+--------+  ICA Mid   118     19                                                 +----------+--------+--------+--------+-------------------------+--------+  ICA Distal115     28                                                 +----------+--------+--------+--------+-------------------------+--------+  ECA      97      2                                                  +----------+--------+--------+--------+-------------------------+--------+   +----------+--------+-------+--------+-------------------+           PSV cm/sEDV cmsDescribeArm Pressure (mmHG)   +----------+--------+-------+--------+-------------------+  Subclavian164    0              156                  +----------+--------+-------+--------+-------------------+   +---------+--------+--+--------+---------------+  VertebralPSV cm/s26EDV cm/sBi- directional  +---------+--------+--+--------+---------------+      Left Carotid Findings:  +----------+--------+--------+--------+-------------------------+--------+           PSV cm/sEDV cm/sStenosisPlaque Description       Comments  +----------+--------+--------+--------+-------------------------+--------+  CCA Prox  73      16                                                 +----------+--------+--------+--------+-------------------------+--------+  CCA Mid   79      15              heterogenous                       +----------+--------+--------+--------+-------------------------+--------+  CCA Distal75      16              heterogenous                       +----------+--------+--------+--------+-------------------------+--------+  ICA Prox  117     32      1-39%   heterogenous and calcific          +----------+--------+--------+--------+-------------------------+--------+  ICA Mid   109     30                                                 +----------+--------+--------+--------+-------------------------+--------+  ICA Distal79      21                                                 +----------+--------+--------+--------+-------------------------+--------+  ECA      318     10      >50%                                       +----------+--------+--------+--------+-------------------------+--------+   +----------+--------+--------+----------------+-------------------+           PSV cm/sEDV cm/sDescribe  Arm Pressure (mmHG)  +----------+--------+--------+----------------+-------------------+  Subclavian165    10      Multiphasic, [id number]                   +----------+--------+--------+----------------+-------------------+   +---------+--------+--+--------+--+---------+  VertebralPSV cm/s59EDV cm/s20Antegrade  +---------+--------+--+--------+--+---------+         Summary:  Right Carotid: Velocities in the right ICA are consistent with a 1-39%  stenosis.   Left Carotid: Velocities in the left ICA are consistent with a 1-39%  stenosis.               The ECA appears >50% stenosed.   Vertebrals:  Left vertebral artery demonstrates antegrade flow. Right  vertebral              artery demonstrates bidirectional flow.  Subclavians: Normal flow hemodynamics were seen in bilateral subclavian               arteries.    ASSESSMENT/PLAN: Right carotid bruit to auscultation Carotid duplex demonstrates < 39% stenosis B  She is asymptomatic for stroke/TIA.  We will continue to follow her pad with serial ABI's on the left LE yearly.  She will call with concerns if they develop.        Mosetta Pigeon PA-C [facility] Office: [phone]  MD in clinic [place]

## 2022-12-15 DIAGNOSIS — N1831 Chronic kidney disease, stage 3a: Secondary | ICD-10-CM | POA: Diagnosis not present

## 2022-12-15 DIAGNOSIS — I10 Essential (primary) hypertension: Secondary | ICD-10-CM | POA: Diagnosis not present

## 2022-12-15 DIAGNOSIS — E1165 Type 2 diabetes mellitus with hyperglycemia: Secondary | ICD-10-CM | POA: Diagnosis not present

## 2022-12-15 DIAGNOSIS — E119 Type 2 diabetes mellitus without complications: Secondary | ICD-10-CM | POA: Diagnosis not present

## 2022-12-15 DIAGNOSIS — E875 Hyperkalemia: Secondary | ICD-10-CM | POA: Diagnosis not present

## 2022-12-21 DIAGNOSIS — R809 Proteinuria, unspecified: Secondary | ICD-10-CM | POA: Diagnosis not present

## 2022-12-21 DIAGNOSIS — I10 Essential (primary) hypertension: Secondary | ICD-10-CM | POA: Diagnosis not present

## 2022-12-21 DIAGNOSIS — D638 Anemia in other chronic diseases classified elsewhere: Secondary | ICD-10-CM | POA: Diagnosis not present

## 2022-12-21 DIAGNOSIS — R059 Cough, unspecified: Secondary | ICD-10-CM | POA: Diagnosis not present

## 2022-12-21 DIAGNOSIS — E7849 Other hyperlipidemia: Secondary | ICD-10-CM | POA: Diagnosis not present

## 2022-12-21 DIAGNOSIS — Z720 Tobacco use: Secondary | ICD-10-CM | POA: Diagnosis not present

## 2022-12-21 DIAGNOSIS — Z0001 Encounter for general adult medical examination with abnormal findings: Secondary | ICD-10-CM | POA: Diagnosis not present

## 2022-12-21 DIAGNOSIS — E1122 Type 2 diabetes mellitus with diabetic chronic kidney disease: Secondary | ICD-10-CM | POA: Diagnosis not present

## 2022-12-21 DIAGNOSIS — R7981 Abnormal blood-gas level: Secondary | ICD-10-CM | POA: Diagnosis not present

## 2022-12-21 DIAGNOSIS — I739 Peripheral vascular disease, unspecified: Secondary | ICD-10-CM | POA: Diagnosis not present

## 2022-12-25 DIAGNOSIS — F17210 Nicotine dependence, cigarettes, uncomplicated: Secondary | ICD-10-CM | POA: Diagnosis not present

## 2022-12-25 DIAGNOSIS — Z122 Encounter for screening for malignant neoplasm of respiratory organs: Secondary | ICD-10-CM | POA: Diagnosis not present

## 2022-12-31 DIAGNOSIS — R7981 Abnormal blood-gas level: Secondary | ICD-10-CM | POA: Diagnosis not present

## 2022-12-31 DIAGNOSIS — R030 Elevated blood-pressure reading, without diagnosis of hypertension: Secondary | ICD-10-CM | POA: Diagnosis not present

## 2022-12-31 DIAGNOSIS — Z720 Tobacco use: Secondary | ICD-10-CM | POA: Diagnosis not present

## 2022-12-31 DIAGNOSIS — J449 Chronic obstructive pulmonary disease, unspecified: Secondary | ICD-10-CM | POA: Diagnosis not present

## 2022-12-31 DIAGNOSIS — Z1211 Encounter for screening for malignant neoplasm of colon: Secondary | ICD-10-CM | POA: Diagnosis not present

## 2023-01-15 DIAGNOSIS — Z1212 Encounter for screening for malignant neoplasm of rectum: Secondary | ICD-10-CM | POA: Diagnosis not present

## 2023-01-15 DIAGNOSIS — Z1211 Encounter for screening for malignant neoplasm of colon: Secondary | ICD-10-CM | POA: Diagnosis not present

## 2023-02-02 DIAGNOSIS — R195 Other fecal abnormalities: Secondary | ICD-10-CM | POA: Diagnosis not present

## 2023-04-05 DIAGNOSIS — Z720 Tobacco use: Secondary | ICD-10-CM | POA: Diagnosis not present

## 2023-04-05 DIAGNOSIS — J439 Emphysema, unspecified: Secondary | ICD-10-CM | POA: Diagnosis not present

## 2023-04-05 DIAGNOSIS — E119 Type 2 diabetes mellitus without complications: Secondary | ICD-10-CM | POA: Diagnosis not present

## 2023-04-05 DIAGNOSIS — E1165 Type 2 diabetes mellitus with hyperglycemia: Secondary | ICD-10-CM | POA: Diagnosis not present

## 2023-04-05 DIAGNOSIS — E7849 Other hyperlipidemia: Secondary | ICD-10-CM | POA: Diagnosis not present

## 2023-04-05 DIAGNOSIS — I739 Peripheral vascular disease, unspecified: Secondary | ICD-10-CM | POA: Diagnosis not present

## 2023-04-06 DIAGNOSIS — E875 Hyperkalemia: Secondary | ICD-10-CM | POA: Diagnosis not present

## 2023-04-06 DIAGNOSIS — E119 Type 2 diabetes mellitus without complications: Secondary | ICD-10-CM | POA: Diagnosis not present

## 2023-04-06 DIAGNOSIS — E782 Mixed hyperlipidemia: Secondary | ICD-10-CM | POA: Diagnosis not present

## 2023-06-07 DIAGNOSIS — R809 Proteinuria, unspecified: Secondary | ICD-10-CM | POA: Diagnosis not present

## 2023-06-07 DIAGNOSIS — N189 Chronic kidney disease, unspecified: Secondary | ICD-10-CM | POA: Diagnosis not present

## 2023-06-07 DIAGNOSIS — D631 Anemia in chronic kidney disease: Secondary | ICD-10-CM | POA: Diagnosis not present

## 2023-06-18 DIAGNOSIS — N2581 Secondary hyperparathyroidism of renal origin: Secondary | ICD-10-CM | POA: Diagnosis not present

## 2023-06-18 DIAGNOSIS — N1832 Chronic kidney disease, stage 3b: Secondary | ICD-10-CM | POA: Diagnosis not present

## 2023-06-18 DIAGNOSIS — E1122 Type 2 diabetes mellitus with diabetic chronic kidney disease: Secondary | ICD-10-CM | POA: Diagnosis not present

## 2023-06-18 DIAGNOSIS — I129 Hypertensive chronic kidney disease with stage 1 through stage 4 chronic kidney disease, or unspecified chronic kidney disease: Secondary | ICD-10-CM | POA: Diagnosis not present

## 2023-07-02 ENCOUNTER — Other Ambulatory Visit: Payer: Self-pay | Admitting: *Deleted

## 2023-07-02 DIAGNOSIS — I739 Peripheral vascular disease, unspecified: Secondary | ICD-10-CM

## 2023-07-06 ENCOUNTER — Encounter (HOSPITAL_COMMUNITY): Payer: Medicare Other

## 2023-07-06 ENCOUNTER — Ambulatory Visit: Payer: Medicare Other

## 2023-07-06 DIAGNOSIS — E875 Hyperkalemia: Secondary | ICD-10-CM | POA: Diagnosis not present

## 2023-07-06 DIAGNOSIS — E1122 Type 2 diabetes mellitus with diabetic chronic kidney disease: Secondary | ICD-10-CM | POA: Diagnosis not present

## 2023-07-06 DIAGNOSIS — E782 Mixed hyperlipidemia: Secondary | ICD-10-CM | POA: Diagnosis not present

## 2023-07-06 DIAGNOSIS — R944 Abnormal results of kidney function studies: Secondary | ICD-10-CM | POA: Diagnosis not present

## 2023-07-12 NOTE — Progress Notes (Unsigned)
 Office Note     CC:  follow up Requesting Provider:  Sarrah Cure, PA-C  HPI: Christy [name] is a 63 y.o. (1960/03/04) female who presents for surveillance of PAD.  Past medical history is significant for type 2 diabetes mellitus, hypertension, CKD, tobacco abuse.  She is well-known to [facility] with history of right leg bypass as well as 4th and 5th toe amputations.  She ultimately underwent right BKA in January 2023 by Dr. Edgardo Goodwill.  She is ambulatory with a prosthetic right leg.  She has a known left SFA occlusion.  Fortunately she denies any claudication in her left leg.  She also denies any current rest pain or tissue loss.  She is on aspirin  and statin daily.  She is an everyday smoker.  Currently she is wearing 5 socks on her R BKA stump in order for her prosthetic limb to fit properly.  She is in need of a prescription for a new "socket or cup" on her prosthetic leg to fit properly.    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   Arthritis    Chronic kidney disease    recent UTI   Coronary artery disease    Depression 07-22-2010   Following sudden death of husband   Diabetes mellitus, type 2 (HCC)    Hypertension    Kidney cysts    Metabolic syndrome    Elevated triglycerides, low HDL, fasting hyperglycemia with low-dose sulfonylurea   Myocardial infarction (HCC) 09/2010   PONV (postoperative nausea and vomiting)    Tobacco abuse    60-90-pack-year history; 1/3 pack per day in 07-22-2011    Past Surgical History:  Procedure Laterality Date   ABDOMINAL AORTAGRAM N/A 10/09/2013   Procedure: ABDOMINAL Tommi Fraise;  Surgeon: Dannis Dy, MD;  Location: [facility] CATH LAB;  Service: Cardiovascular;  Laterality: N/A;   AMPUTATION Right 10/19/2013   Procedure: AMPUTATION DIGIT-RIGHT 4TH TOE AMPUTATION;  Surgeon: Dannis Dy, MD;  Location: [facility] OR;  Service: Vascular;  Laterality: Right;    AMPUTATION Right 03/14/2021   Procedure: RIGHT BELOW KNEE AMPUTATION;  Surgeon: Carlene Che, MD;  Location: [facility] OR;  Service: Vascular;  Laterality: Right;   APPLICATION OF WOUND VAC Right 11/24/2012   Procedure: APPLICATION OF WOUND VAC;  Surgeon: Dewayne Ford, DPM;  Location: [facility] ORS;  Service: Orthopedics;  Laterality: Right;   BILATERAL OOPHORECTOMY  07/21/09   for benign mass   BREAST SURGERY Left    brest biopsy   CHOLECYSTECTOMY     CORONARY ANGIOPLASTY WITH STENT PLACEMENT  10/01/10   DES x2   FEMORAL-POPLITEAL BYPASS GRAFT Right 10/19/2013   Procedure: RIGHT FEMORAL-BELOW KNEE POPLITEAL ARTERY BYPASS GRAFT;  Surgeon: Dannis Dy, MD;  Location: [facility] OR;  Service: Vascular;  Laterality: Right;   INCISION AND DRAINAGE OF WOUND Right 11/24/2012   Procedure: DEBRIDEMENT WOUND FOOT;  Surgeon: Dewayne Ford, DPM;  Location: [facility] ORS;  Service: Orthopedics;  Laterality: Right;   INCISION AND DRAINAGE OF WOUND Right 03/23/2013   Procedure: DEBRIDEMENT OF INFECTED BONE 5TH METATARSAL RIGHT FOOT;  Surgeon: Dewayne Ford, DPM;  Location: [facility] ORS;  Service: Orthopedics;  Laterality: Right;   INTRAOPERATIVE ARTERIOGRAM Right 10/19/2013   Procedure: INTRA OPERATIVE ARTERIOGRAM;  Surgeon: Dannis Dy, MD;  Location: [facility] OR;  Service: Vascular;  Laterality: Right;   METATARSAL HEAD EXCISION Right 11/03/2012   Procedure:  AMPUTATION OF RIGHT FIFTH TOE AND PORTION OF FIFTH METATARSAL;  Surgeon: Dewayne Ford, DPM;  Location: [facility] ORS;  Service: Orthopedics;  Laterality: Right;   MINOR APPLICATION OF WOUND VAC Right 03/14/2021   Procedure: MINOR APPLICATION OF WOUND VAC;  Surgeon: Carlene Che, MD;  Location: [facility] OR;  Service: Vascular;  Laterality: Right;   TOE AMPUTATION      Social History   Socioeconomic History   Marital status: Widowed    Spouse name: Not on file   Number of children: 1   Years of education: Not on file   Highest education level:  12th grade  Occupational History   Occupation: Horticulturist, commercial: [employer]    Comment: Knitting [employer]  Tobacco Use   Smoking status: Some Days    Current packs/day: 1.00    Average packs/day: 1 pack/day for 39.7 years (39.7 ttl pk-yrs)    Types: Cigarettes    Start date: 10/18/1983    Passive exposure: Never   Smokeless tobacco: Never  Vaping Use   Vaping status: Never Used  Substance and Sexual Activity   Alcohol use: No   Drug use: No   Sexual activity: Never    Partners: Male    Birth control/protection: Surgical  Other Topics Concern   Not on file  Social History Narrative   Widowed x 7 years Was married 14 years.  It was 3rd marriage.    Has a 63 yr old dtr w/ 1st husband.    Was born and raised in [place]. Now lives in [place], [place].   Grew up w/ both parents in home.  2 sisters, 1 brother.  Never abused.    No legal trouble.   [employer] not in church now.   Caffeine diet coke all day.   Social Drivers of Corporate investment banker Strain: Low Risk  (03/11/2018)   Overall Financial Resource Strain (CARDIA)    Difficulty of Paying Living Expenses: Not very hard  Food Insecurity: No Food Insecurity (03/11/2018)   Hunger Vital Sign    Worried About Running Out of Food in the Last Year: Never true    Ran Out of Food in the Last Year: Never true  Transportation Needs: No Transportation Needs (03/11/2018)   PRAPARE - Administrator, Civil Service (Medical): No    Lack of Transportation (Non-Medical): No  Physical Activity: Inactive (03/11/2018)   Exercise Vital Sign    Days of Exercise per Week: 0 days    Minutes of Exercise per Session: 0 min  Stress: Stress Concern Present (03/11/2018)   [employer] of Occupational Health - Occupational Stress Questionnaire    Feeling of Stress : Rather much  Social Connections: Socially Isolated (03/11/2018)   Social Connection and Isolation Panel [NHANES]    Frequency of Communication with  Friends and Family: Never    Frequency of Social Gatherings with Friends and Family: Once a week    Attends Religious Services: Never    Database administrator or Organizations: No    Attends Banker Meetings: Never    Marital Status: Widowed  Intimate Partner Violence: Unknown (03/11/2018)   Humiliation, Afraid, Rape, and Kick questionnaire    Fear of Current or Ex-Partner: Not asked    Emotionally Abused: Not on file    Physically Abused: Not on file    Sexually Abused: Not on file    Family History  Problem Relation Age of Onset  Heart disease Father    Hyperlipidemia Father    Hypertension Father    Heart disease Mother    Diabetes Mother    Hyperlipidemia Mother    Hypertension Mother    Glaucoma Mother    Varicose Veins Sister    Cancer Brother    Cancer Maternal Grandfather    Diabetes Maternal Grandmother    Heart disease Maternal Grandmother    Hypertension Maternal Grandmother    COPD Paternal Grandfather    Hyperlipidemia Sister     Current Outpatient Medications  Medication Sig Dispense Refill   aspirin  EC 81 MG tablet Take 81 mg by mouth daily.     atorvastatin  (LIPITOR) 20 MG tablet Take 20 mg by mouth every evening.     citalopram  (CELEXA ) 20 MG tablet Take 20 mg by mouth daily.     fish oil-omega-3 fatty acids 1000 MG capsule Take 3 g by mouth daily.      glipiZIDE  (GLUCOTROL ) 5 MG tablet Take 10 mg by mouth 2 (two) times daily before a meal.     lisinopril  (ZESTRIL ) 10 MG tablet Take 10 mg by mouth daily.     metFORMIN  (GLUCOPHAGE ) 500 MG tablet Take 500 mg by mouth 2 (two) times daily with a meal.     metoprolol  tartrate (LOPRESSOR ) 25 MG tablet Take 1 tablet (25 mg total) by mouth 2 (two) times daily. 180 tablet 3   niacin  500 MG CR capsule Take 1 capsule (500 mg total) by mouth at bedtime. 30 capsule 6   nitroGLYCERIN  (NITROSTAT ) 0.4 MG SL tablet Place 1 tablet (0.4 mg total) under the tongue every 5 (five) minutes as needed. 25 tablet 5    Vitamin D , Cholecalciferol , 25 MCG (1000 UT) TABS Take 1,000 Units by mouth daily. 30 tablet 0   No current facility-administered medications for this visit.    Allergies  Allergen Reactions   Alpha-Gal Anaphylaxis    RED MEAT ALLERGY   Alpha-D-Galactosidase    Bactrim Diarrhea   Isosorbide  Mononitrate Other (See Comments)    Made patient feel as though she was about to have another MI   Keflex  [Cephalexin ] Other (See Comments)    Yeast infection     REVIEW OF SYSTEMS:   [X]  denotes positive finding, [ ]  denotes negative finding Cardiac  Comments:  Chest pain or chest pressure:    Shortness of breath upon exertion:    Short of breath when lying flat:    Irregular heart rhythm:        Vascular    Pain in calf, thigh, or hip brought on by ambulation:    Pain in feet at night that wakes you up from your sleep:     Blood clot in your veins:    Leg swelling:         Pulmonary    Oxygen at home:    Productive cough:     Wheezing:         Neurologic    Sudden weakness in arms or legs:     Sudden numbness in arms or legs:     Sudden onset of difficulty speaking or slurred speech:    Temporary loss of vision in one eye:     Problems with dizziness:         Gastrointestinal    Blood in stool:     Vomited blood:         Genitourinary    Burning when urinating:     Blood in  urine:        Psychiatric    Major depression:         Hematologic    Bleeding problems:    Problems with blood clotting too easily:        Skin    Rashes or ulcers:        Constitutional    Fever or chills:      PHYSICAL EXAMINATION:  Vitals:   07/13/23 1110  BP: 133/82  Pulse: 67  SpO2: 94%  Weight: 221 lb (100.2 kg)  Height: 5\' 5"  (1.651 m)    General:  WDWN in NAD; vital signs documented above Gait: Not observed HENT: WNL, normocephalic Pulmonary: normal non-labored breathing Cardiac: regular HR Abdomen: soft, NT, no masses Skin: without rashes Vascular Exam/Pulses:  absent pedal pulses LLE Extremities: without ischemic changes, without Gangrene , without cellulitis; without open wounds;  Musculoskeletal: no muscle wasting or atrophy  Neurologic: A&O X 3 Psychiatric:  The pt has Normal affect.   Non-Invasive Vascular Imaging:   ABI/TBIToday's ABIToday's TBIPrevious ABIPrevious TBI  +-------+-----------+-----------+------------+------------+  Right BKA                   BKA                       +-------+-----------+-----------+------------+------------+  Left  0.59       0.34       0.67        0.37          +-------+-----------+-----------+------------+------------+      ASSESSMENT/PLAN:: 63 y.o. female here for follow up for surveillance of PAD  Ms. Sauers is a 63 year old female with known PAD with history of right BKA.  She also has a known left SFA occlusion.  She is without claudication, rest pain, or tissue loss of left lower extremity.  No indication for revascularization of left SFA without symptoms.  She will continue her aspirin  and statin daily.  I encouraged her to stop smoking.  She will continue to follow regularly with her PCP for management of chronic medical conditions including hypertension, hyperlipidemia, type 2 diabetes mellitus.  We will repeat left leg ABI in 1 year.  She knows to call/return office sooner with any questions or concerns.  Ms. Adamek is in need of a new "socket or cup"on her prosthetic limb.  She is currently wearing 5 socks on her right BKA stump in order for the socket to fit properly.  She is ambulatory with a cane with her prosthetic leg.  We will provide a prescription to [facility] in order for her to be sized for a new socket to limit any potential injury risk.  Cordie Deters, PA-C [facility] [phone]

## 2023-07-13 ENCOUNTER — Ambulatory Visit

## 2023-07-13 ENCOUNTER — Ambulatory Visit: Admitting: Physician Assistant

## 2023-07-13 VITALS — BP 133/82 | HR 67 | Ht 65.0 in | Wt 221.0 lb

## 2023-07-13 DIAGNOSIS — I739 Peripheral vascular disease, unspecified: Secondary | ICD-10-CM | POA: Diagnosis not present

## 2023-07-13 LAB — VAS US ABI WITH/WO TBI: Left ABI: 0.59

## 2023-07-15 DIAGNOSIS — Z0001 Encounter for general adult medical examination with abnormal findings: Secondary | ICD-10-CM | POA: Diagnosis not present

## 2023-07-15 DIAGNOSIS — E782 Mixed hyperlipidemia: Secondary | ICD-10-CM | POA: Diagnosis not present

## 2023-07-15 DIAGNOSIS — E1122 Type 2 diabetes mellitus with diabetic chronic kidney disease: Secondary | ICD-10-CM | POA: Diagnosis not present

## 2023-07-15 DIAGNOSIS — I739 Peripheral vascular disease, unspecified: Secondary | ICD-10-CM | POA: Diagnosis not present

## 2023-08-19 DIAGNOSIS — J019 Acute sinusitis, unspecified: Secondary | ICD-10-CM | POA: Diagnosis not present

## 2023-08-27 ENCOUNTER — Telehealth: Payer: Self-pay

## 2023-08-27 NOTE — Telephone Encounter (Signed)
 Pt called asking about a referral to [facility] for her prosthesis.  Called pt back to let her know we will send the referral to [facility].

## 2023-08-30 ENCOUNTER — Telehealth: Payer: Self-pay

## 2023-08-30 NOTE — Telephone Encounter (Signed)
 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   Referral faxed to [facility] 08/30/23 @ 0945

## 2023-10-05 DIAGNOSIS — R944 Abnormal results of kidney function studies: Secondary | ICD-10-CM | POA: Diagnosis not present

## 2023-10-05 DIAGNOSIS — E7849 Other hyperlipidemia: Secondary | ICD-10-CM | POA: Diagnosis not present

## 2023-10-05 DIAGNOSIS — E1165 Type 2 diabetes mellitus with hyperglycemia: Secondary | ICD-10-CM | POA: Diagnosis not present

## 2023-10-05 DIAGNOSIS — Z0001 Encounter for general adult medical examination with abnormal findings: Secondary | ICD-10-CM | POA: Diagnosis not present

## 2023-10-05 DIAGNOSIS — E875 Hyperkalemia: Secondary | ICD-10-CM | POA: Diagnosis not present

## 2023-10-05 DIAGNOSIS — N1831 Chronic kidney disease, stage 3a: Secondary | ICD-10-CM | POA: Diagnosis not present

## 2023-10-11 DIAGNOSIS — E782 Mixed hyperlipidemia: Secondary | ICD-10-CM | POA: Diagnosis not present

## 2023-10-11 DIAGNOSIS — N1831 Chronic kidney disease, stage 3a: Secondary | ICD-10-CM | POA: Diagnosis not present

## 2023-10-11 DIAGNOSIS — I739 Peripheral vascular disease, unspecified: Secondary | ICD-10-CM | POA: Diagnosis not present

## 2023-10-20 DIAGNOSIS — D631 Anemia in chronic kidney disease: Secondary | ICD-10-CM | POA: Diagnosis not present

## 2023-10-20 DIAGNOSIS — N189 Chronic kidney disease, unspecified: Secondary | ICD-10-CM | POA: Diagnosis not present

## 2023-10-20 DIAGNOSIS — R809 Proteinuria, unspecified: Secondary | ICD-10-CM | POA: Diagnosis not present

## 2023-11-01 DIAGNOSIS — N2581 Secondary hyperparathyroidism of renal origin: Secondary | ICD-10-CM | POA: Diagnosis not present

## 2023-11-01 DIAGNOSIS — I129 Hypertensive chronic kidney disease with stage 1 through stage 4 chronic kidney disease, or unspecified chronic kidney disease: Secondary | ICD-10-CM | POA: Diagnosis not present

## 2023-11-01 DIAGNOSIS — E1122 Type 2 diabetes mellitus with diabetic chronic kidney disease: Secondary | ICD-10-CM | POA: Diagnosis not present

## 2023-11-01 DIAGNOSIS — N1832 Chronic kidney disease, stage 3b: Secondary | ICD-10-CM | POA: Diagnosis not present

## 2023-11-19 ENCOUNTER — Telehealth: Payer: Self-pay

## 2023-11-19 NOTE — Telephone Encounter (Signed)
 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  She says it doesn't look bruised but is very sore. She reported that she hasn't fallen or hit it on anything. She reports to signs of infection and no ulcers or wounds.  Consulted Sam Brisbane, [place] and she advised pt to make an appt with [facility] to see if there is a problem with her prosthesis.  Pt agreed to do as advised.  Advised pt to call back after meeting with [facility] if the pain does not go away or gets worse.

## 2023-12-13 ENCOUNTER — Other Ambulatory Visit (HOSPITAL_COMMUNITY): Payer: Self-pay | Admitting: Physician Assistant

## 2023-12-13 DIAGNOSIS — F17200 Nicotine dependence, unspecified, uncomplicated: Secondary | ICD-10-CM

## 2023-12-28 NOTE — Progress Notes (Signed)
 MARJI KUEHNEL                                          MRN: [id number]   12/28/2023   The [facility] Quality Team Specialist reviewed this patient medical record for the purposes of chart review for care gap closure. The following were reviewed: chart review for care gap closure-kidney health evaluation for diabetes:eGFR  and uACR.    [facility] Quality Team

## 2023-12-28 NOTE — Progress Notes (Signed)
 Christy [name]                                          MRN: [id number]   12/28/2023   The [facility] Quality Team Specialist reviewed this patient medical record for the purposes of chart review for care gap closure. The following were reviewed: chart review for care gap closure-breast cancer screening.    [facility] Quality Team

## 2024-02-03 ENCOUNTER — Ambulatory Visit (HOSPITAL_COMMUNITY)
